# Patient Record
Sex: Female | Born: 1945 | Race: White | Hispanic: No | Marital: Single | Smoking: Never smoker
Health system: Southern US, Community
[De-identification: ages and names within clinical notes are randomized; demographics above are authoritative.]

## PROBLEM LIST (undated history)

## (undated) DIAGNOSIS — E0590 Thyrotoxicosis, unspecified without thyrotoxic crisis or storm: Secondary | ICD-10-CM

## (undated) DIAGNOSIS — R143 Flatulence: Secondary | ICD-10-CM

## (undated) DIAGNOSIS — N289 Disorder of kidney and ureter, unspecified: Secondary | ICD-10-CM

## (undated) DIAGNOSIS — I2782 Chronic pulmonary embolism: Secondary | ICD-10-CM

## (undated) DIAGNOSIS — I248 Other forms of acute ischemic heart disease: Secondary | ICD-10-CM

## (undated) DIAGNOSIS — G2581 Restless legs syndrome: Secondary | ICD-10-CM

## (undated) DIAGNOSIS — F419 Anxiety disorder, unspecified: Secondary | ICD-10-CM

## (undated) DIAGNOSIS — E876 Hypokalemia: Secondary | ICD-10-CM

## (undated) DIAGNOSIS — J309 Allergic rhinitis, unspecified: Secondary | ICD-10-CM

## (undated) DIAGNOSIS — Z7901 Long term (current) use of anticoagulants: Secondary | ICD-10-CM

## (undated) DIAGNOSIS — D649 Anemia, unspecified: Secondary | ICD-10-CM

## (undated) DIAGNOSIS — I509 Heart failure, unspecified: Secondary | ICD-10-CM

## (undated) DIAGNOSIS — N3281 Overactive bladder: Secondary | ICD-10-CM

## (undated) DIAGNOSIS — F329 Major depressive disorder, single episode, unspecified: Secondary | ICD-10-CM

## (undated) DIAGNOSIS — K219 Gastro-esophageal reflux disease without esophagitis: Secondary | ICD-10-CM

## (undated) DIAGNOSIS — K529 Noninfective gastroenteritis and colitis, unspecified: Secondary | ICD-10-CM

## (undated) DIAGNOSIS — I10 Essential (primary) hypertension: Secondary | ICD-10-CM

## (undated) DIAGNOSIS — K649 Unspecified hemorrhoids: Secondary | ICD-10-CM

## (undated) DIAGNOSIS — E46 Unspecified protein-calorie malnutrition: Secondary | ICD-10-CM

## (undated) DIAGNOSIS — F411 Generalized anxiety disorder: Secondary | ICD-10-CM

## (undated) DIAGNOSIS — N952 Postmenopausal atrophic vaginitis: Secondary | ICD-10-CM

## (undated) DIAGNOSIS — D691 Qualitative platelet defects: Secondary | ICD-10-CM

## (undated) DIAGNOSIS — D696 Thrombocytopenia, unspecified: Secondary | ICD-10-CM

## (undated) DIAGNOSIS — G5600 Carpal tunnel syndrome, unspecified upper limb: Secondary | ICD-10-CM

## (undated) DIAGNOSIS — T148XXA Other injury of unspecified body region, initial encounter: Secondary | ICD-10-CM

## (undated) DIAGNOSIS — F29 Unspecified psychosis not due to a substance or known physiological condition: Secondary | ICD-10-CM

## (undated) DIAGNOSIS — G9341 Metabolic encephalopathy: Secondary | ICD-10-CM

## (undated) DIAGNOSIS — I2489 Other forms of acute ischemic heart disease: Secondary | ICD-10-CM

## (undated) DIAGNOSIS — M1990 Unspecified osteoarthritis, unspecified site: Secondary | ICD-10-CM

## (undated) DIAGNOSIS — E785 Hyperlipidemia, unspecified: Secondary | ICD-10-CM

## (undated) HISTORY — DX: Major depressive disorder, single episode, unspecified: F32.9

## (undated) HISTORY — DX: Flatulence: R14.3

## (undated) HISTORY — DX: Other forms of acute ischemic heart disease: I24.89

## (undated) HISTORY — PX: GASTRIC BYPASS: SHX52

## (undated) HISTORY — DX: Noninfective gastroenteritis and colitis, unspecified: K52.9

## (undated) HISTORY — PX: TONSILLECTOMY: SUR1361

## (undated) HISTORY — DX: Carpal tunnel syndrome, unspecified upper limb: G56.00

## (undated) HISTORY — DX: Qualitative platelet defects: D69.1

## (undated) HISTORY — DX: Thrombocytopenia, unspecified: D69.6

## (undated) HISTORY — PX: ABDOMINAL HYSTERECTOMY: SHX81

## (undated) HISTORY — DX: Metabolic encephalopathy: G93.41

## (undated) HISTORY — PX: BACK SURGERY: SHX140

## (undated) HISTORY — DX: Hyperlipidemia, unspecified: E78.5

## (undated) HISTORY — DX: Anemia, unspecified: D64.9

## (undated) HISTORY — DX: Postmenopausal atrophic vaginitis: N95.2

## (undated) HISTORY — PX: APPENDECTOMY: SHX54

## (undated) HISTORY — PX: SPINE SURGERY: SHX786

## (undated) HISTORY — DX: Other forms of acute ischemic heart disease: I24.8

## (undated) HISTORY — DX: Anxiety disorder, unspecified: F41.9

## (undated) HISTORY — DX: Chronic pulmonary embolism: I27.82

## (undated) HISTORY — DX: Unspecified psychosis not due to a substance or known physiological condition: F29

## (undated) HISTORY — DX: Generalized anxiety disorder: F41.1

## (undated) HISTORY — DX: Hypokalemia: E87.6

## (undated) HISTORY — DX: Other injury of unspecified body region, initial encounter: T14.8XXA

## (undated) HISTORY — DX: Unspecified osteoarthritis, unspecified site: M19.90

## (undated) HISTORY — DX: Thyrotoxicosis, unspecified without thyrotoxic crisis or storm: E05.90

## (undated) HISTORY — PX: SHOULDER SURGERY: SHX246

## (undated) HISTORY — DX: Allergic rhinitis, unspecified: J30.9

## (undated) HISTORY — DX: Restless legs syndrome: G25.81

## (undated) HISTORY — DX: Unspecified protein-calorie malnutrition: E46

## (undated) HISTORY — DX: Essential (primary) hypertension: I10

## (undated) HISTORY — DX: Gastro-esophageal reflux disease without esophagitis: K21.9

## (undated) HISTORY — DX: Unspecified hemorrhoids: K64.9

## (undated) HISTORY — DX: Overactive bladder: N32.81

## (undated) HISTORY — DX: Long term (current) use of anticoagulants: Z79.01

---

## 2013-11-02 DIAGNOSIS — I10 Essential (primary) hypertension: Secondary | ICD-10-CM | POA: Insufficient documentation

## 2014-04-19 ENCOUNTER — Emergency Department (HOSPITAL_COMMUNITY)
Admission: EM | Admit: 2014-04-19 | Discharge: 2014-04-20 | Disposition: A | Discharge status: Home / Self Care | Payer: Medicare HMO | Attending: Emergency Medicine | Admitting: Emergency Medicine

## 2014-04-19 ENCOUNTER — Encounter (HOSPITAL_COMMUNITY): Payer: Self-pay | Admitting: Emergency Medicine

## 2014-04-19 DIAGNOSIS — G8929 Other chronic pain: Secondary | ICD-10-CM | POA: Insufficient documentation

## 2014-04-19 DIAGNOSIS — M549 Dorsalgia, unspecified: Secondary | ICD-10-CM | POA: Insufficient documentation

## 2014-04-19 DIAGNOSIS — M129 Arthropathy, unspecified: Secondary | ICD-10-CM | POA: Insufficient documentation

## 2014-04-19 DIAGNOSIS — Z79899 Other long term (current) drug therapy: Secondary | ICD-10-CM | POA: Insufficient documentation

## 2014-04-19 DIAGNOSIS — M1990 Unspecified osteoarthritis, unspecified site: Secondary | ICD-10-CM

## 2014-04-19 DIAGNOSIS — Z791 Long term (current) use of non-steroidal anti-inflammatories (NSAID): Secondary | ICD-10-CM | POA: Insufficient documentation

## 2014-04-19 HISTORY — DX: Disorder of kidney and ureter, unspecified: N28.9

## 2014-04-19 LAB — CBC WITH DIFFERENTIAL/PLATELET
Basophils Absolute: 0 10*3/uL (ref 0.0–0.1)
Basophils Relative: 0 % (ref 0–1)
EOS ABS: 0.2 10*3/uL (ref 0.0–0.7)
EOS PCT: 2 % (ref 0–5)
HCT: 34.9 % — ABNORMAL LOW (ref 36.0–46.0)
HEMOGLOBIN: 11.3 g/dL — AB (ref 12.0–15.0)
Lymphocytes Relative: 28 % (ref 12–46)
Lymphs Abs: 2 10*3/uL (ref 0.7–4.0)
MCH: 28.8 pg (ref 26.0–34.0)
MCHC: 32.4 g/dL (ref 30.0–36.0)
MCV: 88.8 fL (ref 78.0–100.0)
MONOS PCT: 14 % — AB (ref 3–12)
Monocytes Absolute: 1 10*3/uL (ref 0.1–1.0)
Neutro Abs: 4 10*3/uL (ref 1.7–7.7)
Neutrophils Relative %: 56 % (ref 43–77)
Platelets: 136 10*3/uL — ABNORMAL LOW (ref 150–400)
RBC: 3.93 MIL/uL (ref 3.87–5.11)
RDW: 13.1 % (ref 11.5–15.5)
WBC: 7.1 10*3/uL (ref 4.0–10.5)

## 2014-04-19 LAB — URINALYSIS, ROUTINE W REFLEX MICROSCOPIC
BILIRUBIN URINE: NEGATIVE
Glucose, UA: NEGATIVE mg/dL
HGB URINE DIPSTICK: NEGATIVE
Ketones, ur: NEGATIVE mg/dL
Nitrite: NEGATIVE
PROTEIN: NEGATIVE mg/dL
Specific Gravity, Urine: 1.027 (ref 1.005–1.030)
Urobilinogen, UA: 1 mg/dL (ref 0.0–1.0)
pH: 6 (ref 5.0–8.0)

## 2014-04-19 LAB — I-STAT CHEM 8, ED
BUN: 26 mg/dL — ABNORMAL HIGH (ref 6–23)
BUN: 26 mg/dL — ABNORMAL HIGH (ref 6–23)
CALCIUM ION: 1.44 mmol/L — AB (ref 1.13–1.30)
CREATININE: 1.4 mg/dL — AB (ref 0.50–1.10)
Calcium, Ion: 1.41 mmol/L — ABNORMAL HIGH (ref 1.13–1.30)
Chloride: 103 mEq/L (ref 96–112)
Chloride: 104 mEq/L (ref 96–112)
Creatinine, Ser: 1.3 mg/dL — ABNORMAL HIGH (ref 0.50–1.10)
GLUCOSE: 99 mg/dL (ref 70–99)
GLUCOSE: 99 mg/dL (ref 70–99)
HCT: 35 % — ABNORMAL LOW (ref 36.0–46.0)
HCT: 36 % (ref 36.0–46.0)
HEMOGLOBIN: 11.9 g/dL — AB (ref 12.0–15.0)
HEMOGLOBIN: 12.2 g/dL (ref 12.0–15.0)
POTASSIUM: 4 meq/L (ref 3.7–5.3)
Potassium: 4 mEq/L (ref 3.7–5.3)
Sodium: 141 mEq/L (ref 137–147)
Sodium: 141 mEq/L (ref 137–147)
TCO2: 23 mmol/L (ref 0–100)
TCO2: 23 mmol/L (ref 0–100)

## 2014-04-19 LAB — URINE MICROSCOPIC-ADD ON

## 2014-04-19 MED ORDER — HYDROMORPHONE HCL PF 1 MG/ML IJ SOLN
1.0000 mg | Freq: Once | INTRAMUSCULAR | Status: AC
  Administered 2014-04-19: 1 mg via INTRAMUSCULAR

## 2014-04-19 MED ORDER — DIAZEPAM 5 MG PO TABS
5.0000 mg | ORAL_TABLET | Freq: Once | ORAL | Status: AC
  Administered 2014-04-19: 5 mg via ORAL
  Filled 2014-04-19: qty 1

## 2014-04-19 MED ORDER — HYDROCODONE-ACETAMINOPHEN 10-325 MG PO TABS: 1.0000 | ORAL_TABLET | Freq: Four times a day (QID) | ORAL | Status: DC | PRN

## 2014-04-19 MED ORDER — DIAZEPAM 5 MG/ML IJ SOLN
5.0000 mg | Freq: Once | INTRAMUSCULAR | Status: AC
  Administered 2014-04-19: 5 mg via INTRAMUSCULAR
  Filled 2014-04-19: qty 2

## 2014-04-19 MED ORDER — HYDROMORPHONE HCL PF 1 MG/ML IJ SOLN
1.0000 mg | Freq: Once | INTRAMUSCULAR | Status: DC
  Filled 2014-04-19: qty 1

## 2014-04-19 MED ORDER — DIAZEPAM 5 MG PO TABS: 5.0000 mg | ORAL_TABLET | Freq: Four times a day (QID) | ORAL | Status: DC | PRN

## 2014-04-19 MED ORDER — HYDROMORPHONE HCL PF 1 MG/ML IJ SOLN
1.0000 mg | Freq: Once | INTRAMUSCULAR | Status: AC
  Administered 2014-04-19: 1 mg via INTRAMUSCULAR
  Filled 2014-04-19: qty 1

## 2014-04-19 NOTE — ED Provider Notes (Addendum)
CSN: [id number]     Arrival date & time 04/19/14  1730 History   First MD Initiated Contact with Patient 04/19/14 1800     Chief Complaint  Patient presents with  . Back Pain     (Consider location/radiation/quality/duration/timing/severity/associated sxs/prior Treatment) HPI Comments: Katie Evans is a 68 year old, morbidly, obese, female, with a history of chronic back pain, and arthritis in both knees, and shoulder, all requiring replacement surgery, but to to her obesity, she cannot receive the services.  She is due to see her pain specialist, tomorrow for an epidural injection for her chronic back pain.  She had an MRI, 3, weeks, ago, report unavailable to [place] as it was obtained, and a different county, but per patient report, she has spinal stenosis, and some tender, degenerative disc disease.  He is in the emergency room tonight because of pain.  She states she cannot lean forward enough to get herself out of the chair, due the onset of severe.  Muscle spasms with movement.  She does not have strength in her legs to boost her from the, chair.  She's been taking more than her prescribed.  Narcotic medication, and is currently out of her Vicodin, and she states the tramadol was not enough to keep her comfortable.  She recently was diagnosed with a urinary tract, and is taking antibiotics at this time.  She denies any nausea, vomiting, or diarrhea.  She also states, that she has not taken her Lasix in 2-3, weeks.  Because of her inability to get to the bathroom quickly enough.  Patient is a 68 y.o. female presenting with back pain. The history is provided by the patient.  Back Pain Location:  Lumbar spine Quality:  Aching Radiates to:  Does not radiate Pain is:  Same all the time Onset quality:  Gradual Timing:  Constant Progression:  Unchanged Chronicity:  Chronic Context: recent illness   Relieved by:  Narcotics and bed rest Worsened by:  Movement Ineffective treatments:   Narcotics Associated symptoms: no dysuria, no perianal numbness and no tingling   Risk factors: obesity     Past Medical History  Diagnosis Date  . Renal disorder    Past Surgical History  Procedure Laterality Date  . Tonsillectomy    . Appendectomy    . Back surgery    . Gastric bypass    . Shoulder surgery    . Abdominal hysterectomy     No family history on file. History  Substance Use Topics  . Smoking status: Never Smoker   . Smokeless tobacco: Not on file  . Alcohol Use: No   OB History   Grav Para Term Preterm Abortions TAB SAB Ect Mult Living                 Review of Systems  Genitourinary: Negative for dysuria.  Musculoskeletal: Positive for back pain.  Neurological: Negative for tingling.      Allergies  Review of patient's allergies indicates no known allergies.  Home Medications   Prior to Admission medications   Medication Sig Start Date End Date Taking? Authorizing Provider  Cholecalciferol (VITAMIN D-3 PO) Take 1 tablet by mouth daily.   Yes Historical Provider, MD  clonazePAM (KLONOPIN) 1 MG tablet Take 1-2 mg by mouth at bedtime as needed (sleep).   Yes Historical Provider, MD  ferrous sulfate 325 (65 FE) MG tablet Take 1,300 mg by mouth daily.   Yes Historical Provider, MD  FIBER PO Take 5 tablets by mouth  daily.   Yes Historical Provider, MD  FUROSEMIDE PO Take 1 tablet by mouth daily as needed (excessive swelling).   Yes Historical Provider, MD  HYDROcodone-acetaminophen (NORCO) 10-325 MG per tablet Take 1 tablet by mouth 4 (four) times daily. scheduled   Yes Historical Provider, MD  loperamide (IMODIUM A-D) 2 MG tablet Take 6 mg by mouth 2 (two) times daily.   Yes Historical Provider, MD  loratadine (CLARITIN) 10 MG tablet Take 10 mg by mouth daily.   Yes Historical Provider, MD  MELOXICAM PO Take 1 tablet by mouth daily.   Yes Historical Provider, MD  nadolol (CORGARD) 40 MG tablet Take 40 mg by mouth 2 (two) times daily.   Yes Historical  Provider, MD  naproxen sodium (ALEVE) 220 MG tablet Take 220 mg by mouth daily as needed (pain).   Yes Historical Provider, MD  pramipexole (MIRAPEX) 1 MG tablet Take 1 mg by mouth daily.   Yes Historical Provider, MD  PRESCRIPTION MEDICATION Take 1 tablet by mouth 2 (two) times daily. Antibiotic started 5/18 or 5/19 -   Yes Historical Provider, MD  traMADol (ULTRAM) 50 MG tablet Take 50 mg by mouth 3 (three) times daily as needed (pain).    Yes Historical Provider, MD   BP 115/95  Pulse 85  Temp(Src) 97.3 F (36.3 C) (Oral)  Resp 14  Ht 5' 2.5" (1.588 m)  Wt 226 lb (102.513 kg)  BMI 40.65 kg/m2  SpO2 100% Physical Exam  Nursing note and vitals reviewed. Constitutional: She appears well-developed and well-nourished.  Morbidly obese  Eyes: Pupils are equal, round, and reactive to light.  Neck: Normal range of motion.  Cardiovascular: Normal rate and regular rhythm.   Pulmonary/Chest: Effort normal.  Musculoskeletal:  Exam limited by body habitus  Neurological: She is alert.  Skin: Skin is warm.    ED Course  Procedures (including critical care time) Labs Review Labs Reviewed  CBC WITH DIFFERENTIAL - Abnormal; Notable for the following:    Hemoglobin 11.3 (*)    HCT 34.9 (*)    Platelets 136 (*)    Monocytes Relative 14 (*)    All other components within normal limits  URINALYSIS, ROUTINE W REFLEX MICROSCOPIC - Abnormal; Notable for the following:    Color, Urine AMBER (*)    Leukocytes, UA MODERATE (*)    All other components within normal limits  URINE MICROSCOPIC-ADD ON - Abnormal; Notable for the following:    Squamous Epithelial / LPF FEW (*)    All other components within normal limits  I-STAT CHEM 8, ED - Abnormal; Notable for the following:    BUN 26 (*)    Creatinine, Ser 1.40 (*)    Calcium, Ion 1.44 (*)    Hemoglobin 11.9 (*)    HCT 35.0 (*)    All other components within normal limits  I-STAT CHEM 8, ED - Abnormal; Notable for the following:    BUN 26  (*)    Creatinine, Ser 1.30 (*)    Calcium, Ion 1.41 (*)    All other components within normal limits  URINE CULTURE    Imaging Review No results found.   EKG Interpretation None      MDM   Final diagnoses:  Chronic back pain  Arthritis         Arman FilterGail K Anis Degidio, NP 04/19/14 2010  Arman FilterGail K Lennie Dunnigan, NP 05/05/14 2018

## 2014-04-19 NOTE — Discharge Instructions (Signed)
Please followup with your primary care provider in back specialist as planned. Return for any changing or worsening symptoms.    Chronic Back Pain  When back pain lasts longer than 3 months, it is called chronic back pain.People with chronic back pain often go through certain periods that are more intense (flare-ups).  CAUSES Chronic back pain can be caused by wear and tear (degeneration) on different structures in your back. These structures include:  The bones of your spine (vertebrae) and the joints surrounding your spinal cord and nerve roots (facets).  The strong, fibrous tissues that connect your vertebrae (ligaments). Degeneration of these structures may result in pressure on your nerves. This can lead to constant pain. HOME CARE INSTRUCTIONS  Avoid bending, heavy lifting, prolonged sitting, and activities which make the problem worse.  Take brief periods of rest throughout the day to reduce your pain. Lying down or standing usually is better than sitting while you are resting.  Take over-the-counter or prescription medicines only as directed by your caregiver. SEEK IMMEDIATE MEDICAL CARE IF:   You have weakness or numbness in one of your legs or feet.  You have trouble controlling your bladder or bowels.  You have nausea, vomiting, abdominal pain, shortness of breath, or fainting. Document Released: 12/20/2004 Document Revised: 02/04/2012 Document Reviewed: 10/27/2011 [facility] Patient Information 2014 [place], [place].    Arthritis, Nonspecific Arthritis is pain, redness, warmth, or puffiness (inflammation) of a joint. The joint may be stiff or hurt when you move it. One or more joints may be affected. There are many types of arthritis. Your doctor may not know what type you have right away. The most common cause of arthritis is wear and tear on the joint (osteoarthritis). HOME CARE   Only take medicine as told by your doctor.  Rest the joint as much as  possible.  Raise (elevate) your joint if it is puffy.  Use crutches if the painful joint is in your leg.  Drink enough fluids to keep your pee (urine) clear or pale yellow.  Follow your doctor's diet instructions.  Use cold packs for very bad joint pain for 10 to 15 minutes every hour. Ask your doctor if it is okay for you to use hot packs.  Exercise as told by your doctor.  Take a warm shower if you have stiffness in the morning.  Move your sore joints throughout the day. GET HELP RIGHT AWAY IF:   You have a fever.  You have very bad joint pain, puffiness, or redness.  You have many joints that are painful and puffy.  You are not getting better with treatment.  You have very bad back pain or leg weakness.  You cannot control when you poop (bowel movement) or pee (urinate).  You do not feel better in 24 hours or are getting worse.  You are having side effects from your medicine. MAKE SURE YOU:   Understand these instructions.  Will watch your condition.  Will get help right away if you are not doing well or get worse. Document Released: 02/06/2010 Document Revised: 05/13/2012 Document Reviewed: 02/06/2010 [facility] Patient Information 2014 [place], [place].

## 2014-04-19 NOTE — ED Provider Notes (Signed)
Katie Evans 8:00PM patient discussed and signed out. Patient with history of chronic back pains he is followed by chronic pain management as well as significant arthritic pains in the joints presenting with complaints of worsened chronic pains. Patient states that her back pain has been worsened over the past several days with spasming in the low back preventing her from moving and standing up easily. Patient complains of arthritic pains in the knees and bilateral shoulders making it hard for her to stand from sitting. She does have an appointment tomorrow with her chronic pain management specialist with plans for possible back injection. Patient received some Valium and just recently some pain medications IM. Plan to reassess and ambulate.  Patient reports little improvement after initial medications. She was helped out of bed with the nurse able to stand and position to the wheelchair and be moved to the bathroom. This is all she is unable to do. She states she is still too uncomfortable for what she would like. At this time we'll give additional dose of pain medicine.  Patient has received additional pain medications. She does express concern for pains but overall improved. She continued to nursing that she was worried about trying to get up and move or stand but later when staff left the room patient was able to get up on her own. At this time she is stable for discharge home. Doubt any acute or emergent process.  Angus Seller, PA-C 04/20/14 [phone]

## 2014-04-19 NOTE — ED Provider Notes (Signed)
Medical screening examination/treatment/procedure(s) were performed by non-physician practitioner and as supervising physician I was immediately available for consultation/collaboration.   EKG Interpretation None        Layla Maw Ward, DO 04/19/14 2341

## 2014-04-19 NOTE — ED Notes (Signed)
Schulz, NP at bedside.  

## 2014-04-19 NOTE — ED Notes (Signed)
Pt in from home via [facility], per report pt c/o bil lower back pain onset x 3 days, pt reports having a kidney infection & was seen & treated for UTI x 7 days, pt reports not taking Lasix d/t chronic immobility issues, pt has +3 bil lower extremity edema, pt denies CP, SOB, N/V/D, pt A&O x4

## 2014-04-19 NOTE — ED Notes (Signed)
Patient was able to stand and pivot to wheelchair with one asst to be taken to the restroom where she was able to transfer from the wheelchair to the commode without any innocent.

## 2014-04-20 NOTE — ED Provider Notes (Signed)
Medical screening examination/treatment/procedure(s) were performed by non-physician practitioner and as supervising physician I was immediately available for consultation/collaboration.   EKG Interpretation None        Layla Maw Xana Bradt, DO 04/20/14 0030

## 2014-04-20 NOTE — ED Notes (Signed)
Patient was able to move herself from the stretcher to the wheelchair without any assistance. She was then taken to the restroom where she transferred herself to the commode and was able to move herself to the sink and back to the wheelchair without any assistance and without any incident.

## 2014-04-21 LAB — URINE CULTURE: Colony Count: 30000

## 2014-05-06 NOTE — ED Provider Notes (Signed)
Medical screening examination/treatment/procedure(s) were performed by non-physician practitioner and as supervising physician I was immediately available for consultation/collaboration.   EKG Interpretation None        Kristen N Ward, DO 05/06/14 0804 

## 2014-05-25 ENCOUNTER — Encounter (HOSPITAL_COMMUNITY): Payer: Self-pay | Admitting: Emergency Medicine

## 2014-05-25 DIAGNOSIS — Z8742 Personal history of other diseases of the female genital tract: Secondary | ICD-10-CM | POA: Insufficient documentation

## 2014-05-25 DIAGNOSIS — Z79899 Other long term (current) drug therapy: Secondary | ICD-10-CM | POA: Insufficient documentation

## 2014-05-25 DIAGNOSIS — G9332 Myalgic encephalomyelitis/chronic fatigue syndrome: Secondary | ICD-10-CM | POA: Insufficient documentation

## 2014-05-25 DIAGNOSIS — M25569 Pain in unspecified knee: Secondary | ICD-10-CM | POA: Insufficient documentation

## 2014-05-25 DIAGNOSIS — G8929 Other chronic pain: Secondary | ICD-10-CM | POA: Insufficient documentation

## 2014-05-25 DIAGNOSIS — E876 Hypokalemia: Secondary | ICD-10-CM | POA: Insufficient documentation

## 2014-05-25 DIAGNOSIS — I509 Heart failure, unspecified: Secondary | ICD-10-CM | POA: Insufficient documentation

## 2014-05-25 DIAGNOSIS — M549 Dorsalgia, unspecified: Secondary | ICD-10-CM | POA: Insufficient documentation

## 2014-05-25 DIAGNOSIS — R350 Frequency of micturition: Secondary | ICD-10-CM | POA: Insufficient documentation

## 2014-05-25 DIAGNOSIS — R5382 Chronic fatigue, unspecified: Secondary | ICD-10-CM | POA: Insufficient documentation

## 2014-05-25 DIAGNOSIS — R609 Edema, unspecified: Secondary | ICD-10-CM | POA: Insufficient documentation

## 2014-05-25 NOTE — ED Notes (Signed)
Patient here with complaint of bilateral leg swelling and fatigue. States that the last time she felt this way she had to stay in the hospital for a week and required blood transfusions. Also states that she has been experiencing some urinary symptoms as well including urgency and dysuria. Hx of CHF. Additionally complains of hoarse voice and mild cough. Currently has gross pitting edema in both legs extending above knees. States that she cannot exert herself at all without getting extremely tired.

## 2014-05-26 ENCOUNTER — Emergency Department (HOSPITAL_COMMUNITY): Payer: Medicare HMO

## 2014-05-26 ENCOUNTER — Emergency Department (HOSPITAL_COMMUNITY)
Admission: EM | Admit: 2014-05-26 | Discharge: 2014-05-26 | Disposition: A | Discharge status: Home / Self Care | Payer: Medicare HMO | Attending: Emergency Medicine | Admitting: Emergency Medicine

## 2014-05-26 DIAGNOSIS — R350 Frequency of micturition: Secondary | ICD-10-CM

## 2014-05-26 DIAGNOSIS — E876 Hypokalemia: Secondary | ICD-10-CM

## 2014-05-26 DIAGNOSIS — M549 Dorsalgia, unspecified: Secondary | ICD-10-CM

## 2014-05-26 DIAGNOSIS — R531 Weakness: Secondary | ICD-10-CM

## 2014-05-26 DIAGNOSIS — R5382 Chronic fatigue, unspecified: Secondary | ICD-10-CM

## 2014-05-26 DIAGNOSIS — M25561 Pain in right knee: Secondary | ICD-10-CM

## 2014-05-26 DIAGNOSIS — G8929 Other chronic pain: Secondary | ICD-10-CM

## 2014-05-26 DIAGNOSIS — R609 Edema, unspecified: Secondary | ICD-10-CM

## 2014-05-26 DIAGNOSIS — M25562 Pain in left knee: Secondary | ICD-10-CM

## 2014-05-26 HISTORY — DX: Heart failure, unspecified: I50.9

## 2014-05-26 LAB — CBC
HEMATOCRIT: 38 % (ref 36.0–46.0)
HEMOGLOBIN: 12 g/dL (ref 12.0–15.0)
MCH: 27.6 pg (ref 26.0–34.0)
MCHC: 31.6 g/dL (ref 30.0–36.0)
MCV: 87.4 fL (ref 78.0–100.0)
Platelets: 173 10*3/uL (ref 150–400)
RBC: 4.35 MIL/uL (ref 3.87–5.11)
RDW: 13.1 % (ref 11.5–15.5)
WBC: 6.5 10*3/uL (ref 4.0–10.5)

## 2014-05-26 LAB — BASIC METABOLIC PANEL
BUN: 26 mg/dL — AB (ref 6–23)
CALCIUM: 10.1 mg/dL (ref 8.4–10.5)
CO2: 24 mEq/L (ref 19–32)
Chloride: 98 mEq/L (ref 96–112)
Creatinine, Ser: 1.39 mg/dL — ABNORMAL HIGH (ref 0.50–1.10)
GFR calc Af Amer: 45 mL/min — ABNORMAL LOW (ref >90)
GFR calc non Af Amer: 39 mL/min — ABNORMAL LOW (ref >90)
GLUCOSE: 115 mg/dL — AB (ref 70–99)
Potassium: 3.6 mEq/L — ABNORMAL LOW (ref 3.7–5.3)
SODIUM: 137 meq/L (ref 137–147)

## 2014-05-26 LAB — URINALYSIS, ROUTINE W REFLEX MICROSCOPIC
Bilirubin Urine: NEGATIVE
GLUCOSE, UA: NEGATIVE mg/dL
HGB URINE DIPSTICK: NEGATIVE
KETONES UR: NEGATIVE mg/dL
Leukocytes, UA: NEGATIVE
Nitrite: NEGATIVE
PROTEIN: NEGATIVE mg/dL
Specific Gravity, Urine: 1.015 (ref 1.005–1.030)
Urobilinogen, UA: 0.2 mg/dL (ref 0.0–1.0)
pH: 5 (ref 5.0–8.0)

## 2014-05-26 LAB — PRO B NATRIURETIC PEPTIDE: Pro B Natriuretic peptide (BNP): 1593 pg/mL — ABNORMAL HIGH (ref 0–125)

## 2014-05-26 MED ORDER — POTASSIUM CHLORIDE CRYS ER 20 MEQ PO TBCR
40.0000 meq | EXTENDED_RELEASE_TABLET | Freq: Once | ORAL | Status: AC
  Administered 2014-05-26: 40 meq via ORAL
  Filled 2014-05-26: qty 2

## 2014-05-26 NOTE — ED Notes (Signed)
Patient states she does not hurt except when she moves or attempts to walk.

## 2014-05-26 NOTE — Discharge Instructions (Signed)
Fatigue Fatigue is a feeling of tiredness, lack of energy, lack of motivation, or feeling tired all the time. Having enough rest, good nutrition, and reducing stress will normally reduce fatigue. Consult your caregiver if it persists. The nature of your fatigue will help your caregiver to find out its cause. The treatment is based on the cause.  CAUSES  There are many causes for fatigue. Most of the time, fatigue can be traced to one or more of your habits or routines. Most causes fit into one or more of three general areas. They are: Lifestyle problems  Sleep disturbances.  Overwork.  Physical exertion.  Unhealthy habits.  Poor eating habits or eating disorders.  Alcohol and/or drug use .  Lack of proper nutrition (malnutrition). Psychological problems  Stress and/or anxiety problems.  Depression.  Grief.  Boredom. Medical Problems or Conditions  Anemia.  Pregnancy.  Thyroid gland problems.  Recovery from major surgery.  Continuous pain.  Emphysema or asthma that is not well controlled  Allergic conditions.  Diabetes.  Infections (such as mononucleosis).  Obesity.  Sleep disorders, such as sleep apnea.  Heart failure or other heart-related problems.  Cancer.  Kidney disease.  Liver disease.  Effects of certain medicines such as antihistamines, cough and cold remedies, prescription pain medicines, heart and blood pressure medicines, drugs used for treatment of cancer, and some antidepressants. SYMPTOMS  The symptoms of fatigue include:   Lack of energy.  Lack of drive (motivation).  Drowsiness.  Feeling of indifference to the surroundings. DIAGNOSIS  The details of how you feel help guide your caregiver in finding out what is causing the fatigue. You will be asked about your present and past health condition. It is important to review all medicines that you take, including prescription and non-prescription items. A thorough exam will be done.  You will be questioned about your feelings, habits, and normal lifestyle. Your caregiver may suggest blood tests, urine tests, or other tests to look for common medical causes of fatigue.  TREATMENT  Fatigue is treated by correcting the underlying cause. For example, if you have continuous pain or depression, treating these causes will improve how you feel. Similarly, adjusting the dose of certain medicines will help in reducing fatigue.  HOME CARE INSTRUCTIONS   Try to get the required amount of good sleep every night.  Eat a healthy and nutritious diet, and drink enough water throughout the day.  Practice ways of relaxing (including yoga or meditation).  Exercise regularly.  Make plans to change situations that cause stress. Act on those plans so that stresses decrease over time. Keep your work and personal routine reasonable.  Avoid street drugs and minimize use of alcohol.  Start taking a daily multivitamin after consulting your caregiver. SEEK MEDICAL CARE IF:   You have persistent tiredness, which cannot be accounted for.  You have fever.  You have unintentional weight loss.  You have headaches.  You have disturbed sleep throughout the night.  You are feeling sad.  You have constipation.  You have dry skin.  You have gained weight.  You are taking any new or different medicines that you suspect are causing fatigue.  You are unable to sleep at night.  You develop any unusual swelling of your legs or other parts of your body. SEEK IMMEDIATE MEDICAL CARE IF:   You are feeling confused.  Your vision is blurred.  You feel faint or pass out.  You develop severe headache.  You develop severe abdominal, pelvic, or  back pain.  You develop chest pain, shortness of breath, or an irregular or fast heartbeat.  You are unable to pass a normal amount of urine.  You develop abnormal bleeding such as bleeding from the rectum or you vomit blood.  You have thoughts  about harming yourself or committing suicide.  You are worried that you might harm someone else. MAKE SURE YOU:   Understand these instructions.  Will watch your condition.  Will get help right away if you are not doing well or get worse. Document Released: 09/09/2007 Document Revised: 02/04/2012 Document Reviewed: 09/09/2007 [facility] Patient Information 2015 [place], [place]. This information is not intended to replace advice given to you by your health care provider. Make sure you discuss any questions you have with your health care provider.  Hypokalemia Hypokalemia means that the amount of potassium in the blood is lower than normal.Potassium is a chemical, called an electrolyte, that helps regulate the amount of fluid in the body. It also stimulates muscle contraction and helps nerves function properly.Most of the body's potassium is inside of cells, and only a very small amount is in the blood. Because the amount in the blood is so small, minor changes can be life-threatening. CAUSES  Antibiotics.  Diarrhea or vomiting.  Using laxatives too much, which can cause diarrhea.  Chronic kidney disease.  Water pills (diuretics).  Eating disorders (bulimia).  Low magnesium level.  Sweating a lot. SIGNS AND SYMPTOMS  Weakness.  Constipation.  Fatigue.  Muscle cramps.  Mental confusion.  Skipped heartbeats or irregular heartbeat (palpitations).  Tingling or numbness. DIAGNOSIS  Your health care provider can diagnose hypokalemia with blood tests. In addition to checking your potassium level, your health care provider may also check other lab tests. TREATMENT Hypokalemia can be treated with potassium supplements taken by mouth or adjustments in your current medicines. If your potassium level is very low, you may need to get potassium through a vein (IV) and be monitored in the hospital. A diet high in potassium is also helpful. Foods high in potassium are:  Nuts, such  as peanuts and pistachios.  Seeds, such as sunflower seeds and pumpkin seeds.  Peas, lentils, and lima beans.  Whole grain and bran cereals and breads.  Fresh fruit and vegetables, such as apricots, avocado, bananas, cantaloupe, kiwi, oranges, tomatoes, asparagus, and potatoes.  Orange and tomato juices.  Red meats.  Fruit yogurt. HOME CARE INSTRUCTIONS  Take all medicines as prescribed by your health care provider.  Maintain a healthy diet by including nutritious food, such as fruits, vegetables, nuts, whole grains, and lean meats.  If you are taking a laxative, be sure to follow the directions on the label. SEEK MEDICAL CARE IF:  Your weakness gets worse.  You feel your heart pounding or racing.  You are vomiting or having diarrhea.  You are diabetic and having trouble keeping your blood glucose in the normal range. SEEK IMMEDIATE MEDICAL CARE IF:  You have chest pain, shortness of breath, or dizziness.  You are vomiting or having diarrhea for more than 2 days.  You faint. MAKE SURE YOU:   Understand these instructions.  Will watch your condition.  Will get help right away if you are not doing well or get worse. Document Released: 11/12/2005 Document Revised: 09/02/2013 Document Reviewed: 05/15/2013 [facility] Patient Information 2015 [place], [place]. This information is not intended to replace advice given to you by your health care provider. Make sure you discuss any questions you have with your health  care provider.  Potassium Content of Foods Potassium is a mineral found in many foods and drinks. It helps keep fluids and minerals balanced in your body and affects how steadily your heart beats. Potassium also helps control your blood pressure and keep your muscles and nervous system healthy. Certain health conditions and medicines may change the balance of potassium in your body. When this happens, you can help balance your level of potassium through the foods  that you do or do not eat. Your health care provider or dietitian may recommend an amount of potassium that you should have each day. The following lists of foods provide the amount of potassium (in parentheses) per serving in each item. HIGH IN POTASSIUM  The following foods and beverages have 200 mg or more of potassium per serving:  Apricots, 2 raw or 5 dry (200 mg).  Artichoke, 1 medium (345 mg).  Avocado, raw,  each (245 mg).  Banana, 1 medium (425 mg).  Beans, lima, or baked beans, canned,  cup (280 mg).  Beans, white, canned,  cup (595 mg).  Beef roast, 3 oz (320 mg).  Beef, ground, 3 oz (270 mg).  Beets, raw or cooked,  cup (260 mg).  Bran muffin, 2 oz (300 mg).  Broccoli,  cup (230 mg).  Brussels sprouts,  cup (250 mg).  Cantaloupe,  cup (215 mg).  Cereal, 100% bran,  cup (200-400 mg).  Cheeseburger, single, fast food, 1 each (225-400 mg).  Chicken, 3 oz (220 mg).  Clams, canned, 3 oz (535 mg).  Crab, 3 oz (225 mg).  Dates, 5 each (270 mg).  Dried beans and peas,  cup (300-475 mg).  Figs, dried, 2 each (260 mg).  Fish: halibut, tuna, cod, snapper, 3 oz (480 mg).  Fish: salmon, haddock, swordfish, perch, 3 oz (300 mg).  Fish, tuna, canned 3 oz (200 mg).  [place] fries, fast food, 3 oz (470 mg).  Granola with fruit and nuts,  cup (200 mg).  Grapefruit juice,  cup (200 mg).  Greens, beet,  cup (655 mg).  Honeydew melon,  cup (200 mg).  Kale, raw, 1 cup (300 mg).  Kiwi, 1 medium (240 mg).  Kohlrabi, rutabaga, parsnips,  cup (280 mg).  Lentils,  cup (365 mg).  Mango, 1 each (325 mg).  Milk, chocolate, 1 cup (420 mg).  Milk: nonfat, low-fat, whole, buttermilk, 1 cup (350-380 mg).  Molasses, 1 Tbsp (295 mg).  Mushrooms,  cup (280) mg.  Nectarine, 1 each (275 mg).  Nuts: almonds, peanuts, hazelnuts, [place], cashew, mixed, 1 oz (200 mg).  Nuts, pistachios, 1 oz (295 mg).  Orange, 1 each (240 mg).  Orange juice,   cup (235 mg).  Papaya, medium,  fruit (390 mg).  Peanut butter, chunky, 2 Tbsp (240 mg).  Peanut butter, smooth, 2 Tbsp (210 mg).  Pear, 1 medium (200 mg).  Pomegranate, 1 whole (400 mg).  Pomegranate juice,  cup (215 mg).  Pork, 3 oz (350 mg).  Potato chips, salted, 1 oz (465 mg).  Potato, baked with skin, 1 medium (925 mg).  Potatoes, boiled,  cup (255 mg).  Potatoes, mashed,  cup (330 mg).  Prune juice,  cup (370 mg).  Prunes, 5 each (305 mg).  Pudding, chocolate,  cup (230 mg).  Pumpkin, canned,  cup (250 mg).  Raisins, seedless,  cup (270 mg).  Seeds, sunflower or pumpkin, 1 oz (240 mg).  Soy milk, 1 cup (300 mg).  Spinach,  cup (420 mg).  Spinach, canned,  cup (370 mg).  Sweet potato, baked with skin, 1 medium (450 mg).  Swiss chard,  cup (480 mg).  Tomato or vegetable juice,  cup (275 mg).  Tomato sauce or puree,  cup (400-550 mg).  Tomato, raw, 1 medium (290 mg).  Tomatoes, canned,  cup (200-300 mg).  [place], 3 oz (250 mg).  Wheat germ, 1 oz (250 mg).  Winter squash,  cup (250 mg).  Yogurt, plain or fruited, 6 oz (260-435 mg).  Zucchini,  cup (220 mg). MODERATE IN POTASSIUM The following foods and beverages have 50-200 mg of potassium per serving:  Apple, 1 each (150 mg).  Apple juice,  cup (150 mg).  Applesauce,  cup (90 mg).  Apricot nectar,  cup (140 mg).  Asparagus, small spears,  cup or 6 spears (155 mg).  Bagel, cinnamon raisin, 1 each (130 mg).  Bagel, egg or plain, 4 in., 1 each (70 mg).  Beans, green,  cup (90 mg).  Beans, yellow,  cup (190 mg).  Beer, regular, 12 oz (100 mg).  Beets, canned,  cup (125 mg).  Blackberries,  cup (115 mg).  Blueberries,  cup (60 mg).  Bread, whole wheat, 1 slice (70 mg).  Broccoli, raw,  cup (145 mg).  Cabbage,  cup (150 mg).  Carrots, cooked or raw,  cup (180 mg).  Cauliflower, raw,  cup (150 mg).  Celery, raw,  cup (155 mg).  Cereal,  bran flakes, cup (120-150 mg).  Cheese, cottage,  cup (110 mg).  Cherries, 10 each (150 mg).  Chocolate, 1 oz bar (165 mg).  Coffee, brewed 6 oz (90 mg).  Corn,  cup or 1 ear (195 mg).  Cucumbers,  cup (80 mg).  Egg, large, 1 each (60 mg).  Eggplant,  cup (60 mg).  Endive, raw, cup (80 mg).  English muffin, 1 each (65 mg).  Fish, orange roughy, 3 oz (150 mg).  Frankfurter, beef or pork, 1 each (75 mg).  Fruit cocktail,  cup (115 mg).  Grape juice,  cup (170 mg).  Grapefruit,  fruit (175 mg).  Grapes,  cup (155 mg).  Greens: kale, turnip, collard,  cup (110-150 mg).  Ice cream or frozen yogurt, chocolate,  cup (175 mg).  Ice cream or frozen yogurt, vanilla,  cup (120-150 mg).  Lemons, limes, 1 each (80 mg).  Lettuce, all types, 1 cup (100 mg).  Mixed vegetables,  cup (150 mg).  Mushrooms, raw,  cup (110 mg).  Nuts: walnuts, pecans, or macadamia, 1 oz (125 mg).  Oatmeal,  cup (80 mg).  Okra,  cup (110 mg).  Onions, raw,  cup (120 mg).  Peach, 1 each (185 mg).  Peaches, canned,  cup (120 mg).  Pears, canned,  cup (120 mg).  Peas, green, frozen,  cup (90 mg).  Peppers, green,  cup (130 mg).  Peppers, red,  cup (160 mg).  Pineapple juice,  cup (165 mg).  Pineapple, fresh or canned,  cup (100 mg).  Plums, 1 each (105 mg).  Pudding, vanilla,  cup (150 mg).  Raspberries,  cup (90 mg).  Rhubarb,  cup (115 mg).  Rice, wild,  cup (80 mg).  Shrimp, 3 oz (155 mg).  Spinach, raw, 1 cup (170 mg).  Strawberries,  cup (125 mg).  Summer squash  cup (175-200 mg).  Swiss chard, raw, 1 cup (135 mg).  Tangerines, 1 each (140 mg).  Tea, brewed, 6 oz (65 mg).  Turnips,  cup (140 mg).  Watermelon,  cup (85 mg).  Wine, red, table, 5 oz (180 mg).  Wine, white, table, 5 oz (100 mg). LOW IN POTASSIUM The following foods and beverages have less than 50 mg of potassium per serving.  Bread, white, 1 slice (30  mg).  Carbonated beverages, 12 oz (less than 5 mg).  Cheese, 1 oz (20-30 mg).  Cranberries,  cup (45 mg).  Cranberry juice cocktail,  cup (20 mg).  Fats and oils, 1 Tbsp (less than 5 mg).  Hummus, 1 Tbsp (32 mg).  Nectar: papaya, mango, or pear,  cup (35 mg).  Rice, white or brown,  cup (50 mg).  Spaghetti or macaroni,  cup cooked (30 mg).  Tortilla, flour or corn, 1 each (50 mg).  Waffle, 4 in., 1 each (50 mg).  Water chestnuts,  cup (40 mg). Document Released: 06/26/2005 Document Revised: 11/17/2013 Document Reviewed: 10/09/2013 [facility] Patient Information 2015 [place], [place]. This information is not intended to replace advice given to you by your health care provider. Make sure you discuss any questions you have with your health care provider.  Urinary Frequency The number of times a normal person urinates depends upon how much liquid they take in and how much liquid they are losing. If the temperature is hot and there is high humidity then the person will sweat more and usually breathe a little more frequently. These factors decrease the amount of frequency of urination that would be considered normal. The amount you drink is easily determined, but the amount of fluid lost is sometimes more difficult to calculate.  Fluid is lost in two ways:  Sensible fluid loss is usually measured by the amount of urine that you get rid of. Losses of fluid can also occur with diarrhea.  Insensible fluid loss is more difficult to measure. It is caused by evaporation. Insensible loss of fluid occurs through breathing and sweating. It usually ranges from a little less than a quart to a little more than a quart of fluid a day. In normal temperatures and activity levels the average person may urinate 4 to 7 times in a 24-hour period. Needing to urinate more often than that could indicate a problem. If one urinates 4 to 7 times in 24 hours and has large volumes each time, that could  indicate a different problem from one who urinates 4 to 7 times a day and has small volumes. The time of urinating is also an important. Most urinating should be done during the waking hours. Getting up at night to urinate frequently can indicate some problems. CAUSES  The bladder is the organ in your lower abdomen that holds urine. Like a balloon, it swells some as it fills up. Your nerves sense this and tell you it is time to head for the bathroom. There are a number of reasons that you might feel the need to urinate more often than usual. They include:  Urinary tract infection. This is usually associated with other signs such as burning when you urinate.  In men, problems with the prostate (a walnut-size gland that is located near the tube that carries urine out of your body). There are two reasons why the prostate can cause an increased frequency of urination:  An enlarged prostate that does not let the bladder empty well. If the bladder only half empties when you urinate then it only has half the capacity to fill before you have to urinate again.  The nerves in the bladder become more hypersensitive with an increased size of the prostate even  if the bladder empties completely.  Pregnancy.  Obesity. Excess weight is more likely to cause a problem for women more than for men.  Bladder stones or other bladder problems.  Caffeine.  Alcohol.  Medications. For example, drugs that help the body get rid of extra fluid (diuretics) increase urine production. Some other medicines must be taken with lots of fluids.  Muscle or nerve weakness. This might be the result of a spinal cord injury, a stroke, multiple sclerosis or Parkinson's disease.  Long-standing diabetes can decrease the sensation of the bladder. This loss of sensation makes it harder to sense the bladder needs to be emptied. Over a period of years the bladder is stretched out by constant overfilling. This weakens the bladder muscles  so that the bladder does not empty well and has less capacity to fill with new urine.  Interstitial cystitis (also called painful bladder syndrome). This condition develops because the tissues that line the insider of the bladder are inflamed (inflammation is the body's way of reacting to injury or infection). It causes pain and frequent urination. It occurs in women more often than in men. DIAGNOSIS   To decide what might be causing your urinary frequency, your healthcare provider will probably:  Ask about symptoms you have noticed.  Ask about your overall health. This will include questions about any medications you are taking.  Do a physical examination.  Order some tests. These might include:  A blood test to check for diabetes or other health issues that could be contributing to the problem.  Urine testing. This could measure the flow of urine and the pressure on the bladder.  A test of your neurological system (the brain, spinal cord and nerves). This is the system that senses the need to urinate.  A bladder test to check whether it is emptying completely when you urinate.  Cytoscopy. This test uses a thin tube with a tiny camera on it. It offers a look inside your urethra and bladder to see if there are problems.  Imaging tests. You might be given a contrast dye and then asked to urinate. X-rays are taken to see how your bladder is working. TREATMENT  It is important for you to be evaluated to determine if the amount or frequency that you have is unusual or abnormal. If it is found to be abnormal the cause should be determined and this can usually be found out easily. Depending upon the cause treatment could include medication, stimulation of the nerves, or surgery. There are not too many things that you can do as an individual to change your urinary frequency. It is important that you balance the amount of fluid intake needed to compensate for your activity and the temperature.  Medical problems will be diagnosed and taken care of by your physician. There is no particular bladder training such as Kegel's exercises that you can do to help urinary frequency. This is an exercise this is usually done for people who have leaking of urine when they laugh cough or sneeze. HOME CARE INSTRUCTIONS   Take any medications your healthcare provider prescribed or suggested. Follow the directions carefully.  Practice any lifestyle changes that are recommended. These might include:  Drinking less fluid or drinking at different times of the day. If you need to urinate often during the night, for example, you may need to stop drinking fluids early in the evening.  Cutting down on caffeine or alcohol. They both can make you need to urinate more  often than normal. Caffeine is found in coffee, tea and sodas.  Losing weight, if that is recommended.  Keep a journal or a log. You might be asked to record how much you drink and when and when you feel the need to urinate. This will also help evaluate how well the treatment provided by your physician is working. SEEK MEDICAL CARE IF:   Your need to urinate often gets worse.  You feel increased pain or irritation when you urinate.  You notice blood in your urine.  You have questions about any medications that your healthcare provider recommended.  You notice blood, pus or swelling at the site of any test or treatment procedure.  You develop a fever of more than 100.5 F (38.1 C). SEEK IMMEDIATE MEDICAL CARE IF:  You develop a fever of more than 102.0 F (38.9 C). Document Released: 09/08/2009 Document Revised: 02/04/2012 Document Reviewed: 09/08/2009 [facility] Patient Information 2015 [place], [place]. This information is not intended to replace advice given to you by your health care provider. Make sure you discuss any questions you have with your health care provider.  Edema Edema is an abnormal buildup of fluids in your bodytissues.  Edema is somewhatdependent on gravity to pull the fluid to the lowest place in your body. That makes the condition more common in the legs and thighs (lower extremities). Painless swelling of the feet and ankles is common and becomes more likely as you get older. It is also common in looser tissues, like around your eyes.  When the affected area is squeezed, the fluid may move out of that spot and leave a dent for a few moments. This dent is called pitting.  CAUSES  There are many possible causes of edema. Eating too much salt and being on your feet or sitting for a long time can cause edema in your legs and ankles. Hot weather may make edema worse. Common medical causes of edema include:  Heart failure.  Liver disease.  Kidney disease.  Weak blood vessels in your legs.  Cancer.  An injury.  Pregnancy.  Some medications.  Obesity. SYMPTOMS  Edema is usually painless.Your skin may look swollen or shiny.  DIAGNOSIS  Your health care provider may be able to diagnose edema by asking about your medical history and doing a physical exam. You may need to have tests such as X-rays, an electrocardiogram, or blood tests to check for medical conditions that may cause edema.  TREATMENT  Edema treatment depends on the cause. If you have heart, liver, or kidney disease, you need the treatment appropriate for these conditions. General treatment may include:  Elevation of the affected body part above the level of your heart.  Compression of the affected body part. Pressure from elastic bandages or support stockings squeezes the tissues and forces fluid back into the blood vessels. This keeps fluid from entering the tissues.  Restriction of fluid and salt intake.  Use of a water pill (diuretic). These medications are appropriate only for some types of edema. They pull fluid out of your body and make you urinate more often. This gets rid of fluid and reduces swelling, but diuretics can have side  effects. Only use diuretics as directed by your health care provider. HOME CARE INSTRUCTIONS   Keep the affected body part above the level of your heart when you are lying down.   Do not sit still or stand for prolonged periods.   Do not put anything directly under your knees when  lying down.  Do not wear constricting clothing or garters on your upper legs.   Exercise your legs to work the fluid back into your blood vessels. This may help the swelling go down.   Wear elastic bandages or support stockings to reduce ankle swelling as directed by your health care provider.   Eat a low-salt diet to reduce fluid if your health care provider recommends it.   Only take medicines as directed by your health care provider. SEEK MEDICAL CARE IF:   Your edema is not responding to treatment.  You have heart, liver, or kidney disease and notice symptoms of edema.  You have edema in your legs that does not improve after elevating them.   You have sudden and unexplained weight gain. SEEK IMMEDIATE MEDICAL CARE IF:   You develop shortness of breath or chest pain.   You cannot breathe when you lie down.  You develop pain, redness, or warmth in the swollen areas.   You have heart, liver, or kidney disease and suddenly get edema.  You have a fever and your symptoms suddenly get worse. MAKE SURE YOU:   Understand these instructions.  Will watch your condition.  Will get help right away if you are not doing well or get worse. Document Released: 11/12/2005 Document Revised: 11/17/2013 Document Reviewed: 09/04/2013 [facility] Patient Information 2015 [place]. This information is not intended to replace advice given to you by your health care provider. Make sure you discuss any questions you have with your health care provider.

## 2014-05-26 NOTE — ED Provider Notes (Signed)
CSN: [id number]     Arrival date & time 05/25/14  2327 History   First MD Initiated Contact with Patient 05/26/14 0141     Chief Complaint  Patient presents with  . Fatigue  . Cough  . Leg Swelling     (Consider location/radiation/quality/duration/timing/severity/associated sxs/prior Treatment) HPI 68 year old female presents to the emergency department from home with complaint of fatigue.  Patient reports that she has been fatigued for some time, it has been worsening.  Patient reports she had similar fatigue about 2 years ago that required several blood transfusions.  She reports at that time they were unsure what caused her anemia.  Patient also complaining of bilateral lower extremity edema, dry cough, frequent urination.  She reports history of congestive heart failure.  She takes Lasix as needed.  She reports when she takes her Lasix and elevate her feet her swelling improves.  Patient reports history of chronic back pain and knee pain.  She's been advised that she needs bilateral knee replacements.  She has been advised that she has to lose weight prior to being able to have surgery.  Patient reports she has increasing urinary frequency and often incontinence.  She feels that this is worsening and that she might have a urinary tract infection.  Patient is followed by a von towel.  Patient reports she had a stress test done recently in preparation of having the surgery which was negative.  She was told she had congestive heart failure.  She does not know her ejection fraction.  She denies any chest pain.  Patient reports shortness of breath worse with exertion.  No orthopnea.   Past Medical History  Diagnosis Date  . Renal disorder   . CHF (congestive heart failure)    Past Surgical History  Procedure Laterality Date  . Tonsillectomy    . Appendectomy    . Back surgery    . Gastric bypass    . Shoulder surgery    . Abdominal hysterectomy     No family history on file. History   Substance Use Topics  . Smoking status: Never Smoker   . Smokeless tobacco: Not on file  . Alcohol Use: No   OB History   Grav Para Term Preterm Abortions TAB SAB Ect Mult Living                 Review of Systems  See History of Present Illness; otherwise all other systems are reviewed and negative   Allergies  Review of patient's allergies indicates no known allergies.  Home Medications   Prior to Admission medications   Medication Sig Start Date End Date Taking? Authorizing Provider  Cholecalciferol (VITAMIN D-3 PO) Take 1 tablet by mouth daily.   Yes Historical Provider, MD  clonazePAM (KLONOPIN) 1 MG tablet Take 1-2 mg by mouth at bedtime as needed (sleep).   Yes Historical Provider, MD  furosemide (LASIX) 20 MG tablet Take 40 mg by mouth daily.    Yes Historical Provider, MD  HYDROcodone-acetaminophen (NORCO) 10-325 MG per tablet Take 1 tablet by mouth 4 (four) times daily. scheduled   Yes Historical Provider, MD  loperamide (IMODIUM A-D) 2 MG tablet Take 6 mg by mouth 2 (two) times daily.   Yes Historical Provider, MD  loratadine (CLARITIN) 10 MG tablet Take 10 mg by mouth daily.   Yes Historical Provider, MD  nadolol (CORGARD) 40 MG tablet Take 40 mg by mouth 2 (two) times daily.   Yes Historical Provider, MD  pramipexole (  MIRAPEX) 1 MG tablet Take 1 mg by mouth daily.   Yes Historical Provider, MD   BP 112/55  Pulse 87  Temp(Src) 97.5 F (36.4 C) (Oral)  Resp 21  Wt 217 lb (98.431 kg)  SpO2 97% Physical Exam  Nursing note and vitals reviewed. Constitutional: She is oriented to person, place, and time. She appears well-developed and well-nourished.  HENT:  Head: Normocephalic and atraumatic.  Right Ear: External ear normal.  Left Ear: External ear normal.  Nose: Nose normal.  Mouth/Throat: Oropharynx is clear and moist.  Eyes: Conjunctivae and EOM are normal. Pupils are equal, round, and reactive to light.  Neck: Normal range of motion. Neck supple. No JVD  present. No tracheal deviation present. No thyromegaly present.  Cardiovascular: Normal rate, regular rhythm, normal heart sounds and intact distal pulses.  Exam reveals no gallop and no friction rub.   No murmur heard. Pulmonary/Chest: Effort normal and breath sounds normal. No stridor. No respiratory distress. She has no wheezes. She has no rales. She exhibits no tenderness.  Abdominal: Soft. Bowel sounds are normal. She exhibits no distension and no mass. There is no tenderness. There is no rebound and no guarding.  Musculoskeletal: Normal range of motion. She exhibits edema (2+ edema to knees) and tenderness.  Lymphadenopathy:    She has no cervical adenopathy.  Neurological: She is alert and oriented to person, place, and time. She has normal reflexes. No cranial nerve deficit. She exhibits normal muscle tone. Coordination (tenderness diffusely over knees bilaterally) normal.  Skin: Skin is warm and dry. No rash noted. No erythema. No pallor.  Psychiatric: Her behavior is normal. Judgment and thought content normal.  Flat affect, depressed mood    ED Course  Procedures (including critical care time) Labs Review Labs Reviewed  BASIC METABOLIC PANEL - Abnormal; Notable for the following:    Potassium 3.6 (*)    Glucose, Bld 115 (*)    BUN 26 (*)    Creatinine, Ser 1.39 (*)    GFR calc non Af Amer 39 (*)    GFR calc Af Amer 45 (*)    All other components within normal limits  PRO B NATRIURETIC PEPTIDE - Abnormal; Notable for the following:    Pro B Natriuretic peptide (BNP) 1593.0 (*)    All other components within normal limits  CBC  URINALYSIS, ROUTINE W REFLEX MICROSCOPIC    Imaging Review Dg Chest 2 View  05/26/2014   CLINICAL DATA:  Shortness of breath.  History of CHF  EXAM: CHEST  2 VIEW  COMPARISON:  None.  FINDINGS: Normal heart size and mediastinal contours. There is minimal atelectasis or scarring at the peripheral left base. No consolidation or edema. No effusion or  pneumothorax. Left glenohumeral arthroplasty. There is a compression deformity at the thoracolumbar junction, likely of T12, presumably remote given the history. Height loss is mild.  IMPRESSION: No active cardiopulmonary disease.   Electronically Signed   By: Tiburcio Pea M.D.   On: 05/26/2014 02:34     EKG Interpretation None      MDM   Final diagnoses:  Chronic fatigue  Generalized weakness  Knee pain, bilateral  Chronic back pain  Urinary frequency  Hypokalemia  Peripheral edema    68 year old female with fatigue.  Workup here fairly unremarkable.  Outside records reviewed.  No signs of acute CHF exacerbation.  No anemia.  No urinary tract infection.  I feel patient is safe for discharge home and followup with her primary care  Dr. as well as her specialist as she has in place already.    Olivia Mackielga M Kailei Cowens, MD 05/26/14 [phone]

## 2014-05-26 NOTE — ED Notes (Signed)
Pt presents with increased weakness, progressively worse exertional SOB and increased bilat LE pitting edema. Pt denies chest pain or cough at present. Pt resting comfortably on bed in no acute distress.

## 2014-06-25 ENCOUNTER — Emergency Department (HOSPITAL_BASED_OUTPATIENT_CLINIC_OR_DEPARTMENT_OTHER)
Admission: EM | Admit: 2014-06-25 | Discharge: 2014-06-26 | Disposition: A | Discharge status: Home / Self Care | Payer: Medicare HMO | Attending: Emergency Medicine | Admitting: Emergency Medicine

## 2014-06-25 ENCOUNTER — Encounter (HOSPITAL_BASED_OUTPATIENT_CLINIC_OR_DEPARTMENT_OTHER): Payer: Self-pay | Admitting: Emergency Medicine

## 2014-06-25 DIAGNOSIS — R5383 Other fatigue: Secondary | ICD-10-CM | POA: Diagnosis present

## 2014-06-25 DIAGNOSIS — Z87448 Personal history of other diseases of urinary system: Secondary | ICD-10-CM | POA: Diagnosis not present

## 2014-06-25 DIAGNOSIS — Z79899 Other long term (current) drug therapy: Secondary | ICD-10-CM | POA: Diagnosis not present

## 2014-06-25 DIAGNOSIS — R5381 Other malaise: Secondary | ICD-10-CM | POA: Diagnosis not present

## 2014-06-25 DIAGNOSIS — I509 Heart failure, unspecified: Secondary | ICD-10-CM | POA: Diagnosis not present

## 2014-06-25 LAB — COMPREHENSIVE METABOLIC PANEL
ALT: 16 U/L (ref 0–35)
ANION GAP: 17 — AB (ref 5–15)
AST: 25 U/L (ref 0–37)
Albumin: 3.1 g/dL — ABNORMAL LOW (ref 3.5–5.2)
Alkaline Phosphatase: 98 U/L (ref 39–117)
BILIRUBIN TOTAL: 0.4 mg/dL (ref 0.3–1.2)
BUN: 20 mg/dL (ref 6–23)
CHLORIDE: 96 meq/L (ref 96–112)
CO2: 24 meq/L (ref 19–32)
CREATININE: 1 mg/dL (ref 0.50–1.10)
Calcium: 11 mg/dL — ABNORMAL HIGH (ref 8.4–10.5)
GFR calc Af Amer: 67 mL/min — ABNORMAL LOW (ref >90)
GFR calc non Af Amer: 58 mL/min — ABNORMAL LOW (ref >90)
Glucose, Bld: 157 mg/dL — ABNORMAL HIGH (ref 70–99)
Potassium: 3.2 mEq/L — ABNORMAL LOW (ref 3.7–5.3)
Sodium: 137 mEq/L (ref 137–147)
Total Protein: 6.9 g/dL (ref 6.0–8.3)

## 2014-06-25 LAB — URINALYSIS, ROUTINE W REFLEX MICROSCOPIC
Bilirubin Urine: NEGATIVE
GLUCOSE, UA: NEGATIVE mg/dL
Hgb urine dipstick: NEGATIVE
KETONES UR: NEGATIVE mg/dL
NITRITE: NEGATIVE
Protein, ur: NEGATIVE mg/dL
Specific Gravity, Urine: 1.007 (ref 1.005–1.030)
Urobilinogen, UA: 1 mg/dL (ref 0.0–1.0)
pH: 6 (ref 5.0–8.0)

## 2014-06-25 LAB — CBC
HEMATOCRIT: 38 % (ref 36.0–46.0)
Hemoglobin: 12.4 g/dL (ref 12.0–15.0)
MCH: 27.7 pg (ref 26.0–34.0)
MCHC: 32.6 g/dL (ref 30.0–36.0)
MCV: 84.8 fL (ref 78.0–100.0)
Platelets: 180 10*3/uL (ref 150–400)
RBC: 4.48 MIL/uL (ref 3.87–5.11)
RDW: 13.7 % (ref 11.5–15.5)
WBC: 8.8 10*3/uL (ref 4.0–10.5)

## 2014-06-25 LAB — URINE MICROSCOPIC-ADD ON

## 2014-06-25 LAB — TROPONIN I: Troponin I: <0.3 ng/mL (ref <0.30)

## 2014-06-25 NOTE — ED Notes (Signed)
Gradually getting weaker over several weeks

## 2014-06-25 NOTE — ED Notes (Signed)
I met patient in waiting room, took to room by wheelchair. I got ECG and gave copy to Dr. Canary Brim, had problems with ECG transmittal, which delayed vitals.

## 2014-06-25 NOTE — ED Provider Notes (Signed)
CSN: [id number]     Arrival date & time 06/25/14  2112 History  This chart was scribed for No att. providers found by Carl Best, ED Scribe. This patient was seen in room MHOTF/OTF and the patient's care was started at 9:44 PM.     Chief Complaint  Patient presents with  . Fatigue   The history is provided by the patient. No language interpreter was used.   HPI Comments: Katie Evans is a 68 y.o. female with a history of CHF and renal disorder who presents to the Emergency Department complaining of gradually worsening weakness that started three weeks ago.  She has not been eating and drinking normally since her symptoms started.  She believes that she may be dehydrated.  She denies abdominal pain, nausea, vomiting, new diarrhea, fever, and new SOB as associated symptoms.  She has experienced these symptoms in the past and was diagnosed with anemia.  She takes two Lasix daily but only took one dose of Lasix today.  She does not have any home healthcare.    Past Medical History  Diagnosis Date  . Renal disorder   . CHF (congestive heart failure)    Past Surgical History  Procedure Laterality Date  . Tonsillectomy    . Appendectomy    . Back surgery    . Gastric bypass    . Shoulder surgery    . Abdominal hysterectomy     No family history on file. History  Substance Use Topics  . Smoking status: Never Smoker   . Smokeless tobacco: Not on file  . Alcohol Use: No   OB History   Grav Para Term Preterm Abortions TAB SAB Ect Mult Living                 Review of Systems  Constitutional: Positive for activity change. Negative for fever.  Gastrointestinal: Negative for nausea, vomiting, abdominal pain and diarrhea.  Neurological: Positive for weakness.  All other systems reviewed and are negative.     Allergies  Review of patient's allergies indicates no known allergies.  Home Medications   Prior to Admission medications   Medication Sig Start Date End Date Taking?  Authorizing Provider  Cholecalciferol (VITAMIN D-3 PO) Take 1 tablet by mouth daily.    Historical Provider, MD  clonazePAM (KLONOPIN) 1 MG tablet Take 1-2 mg by mouth at bedtime as needed (sleep).    Historical Provider, MD  furosemide (LASIX) 20 MG tablet Take 40 mg by mouth daily.     Historical Provider, MD  HYDROcodone-acetaminophen (NORCO) 10-325 MG per tablet Take 1 tablet by mouth 4 (four) times daily. scheduled    Historical Provider, MD  loperamide (IMODIUM A-D) 2 MG tablet Take 6 mg by mouth 2 (two) times daily.    Historical Provider, MD  loratadine (CLARITIN) 10 MG tablet Take 10 mg by mouth daily.    Historical Provider, MD  nadolol (CORGARD) 40 MG tablet Take 40 mg by mouth 2 (two) times daily.    Historical Provider, MD  pramipexole (MIRAPEX) 1 MG tablet Take 1 mg by mouth daily.    Historical Provider, MD   Triage Vitals: BP 130/64  Pulse 91  Temp(Src) 97.1 F (36.2 C) (Oral)  Resp 24  Ht 5' 2.5" (1.588 m)  Wt 215 lb (97.523 kg)  BMI 38.67 kg/m2  SpO2 98%  Physical Exam  Nursing note and vitals reviewed. Constitutional: She is oriented to person, place, and time. She appears well-developed and well-nourished.  HENT:  Head: Normocephalic and atraumatic.  Mouth/Throat: Mucous membranes are normal.  Eyes: EOM are normal.  Neck: Normal range of motion.  Cardiovascular: Normal rate, regular rhythm and normal heart sounds.  Exam reveals no gallop and no friction rub.   No murmur heard. Pulmonary/Chest: Effort normal and breath sounds normal. No respiratory distress. She has no wheezes. She has no rales.  Abdominal: There is no tenderness.  Musculoskeletal: Normal range of motion. She exhibits edema.  2+ symmetric edema of lower extremities.  Neurological: She is alert and oriented to person, place, and time.  Skin: Skin is warm and dry.  Psychiatric: She has a normal mood and affect. Her behavior is normal.    ED Course  Procedures (including critical care  time)  DIAGNOSTIC STUDIES: Oxygen Saturation is 98% on room air, normal by my interpretation.    COORDINATION OF CARE: 9:46 PM- Discussed checking the patient's lab work.  Advised the patient to follow-up with her PCP if her lab work returns normal.  The patient agreed to the treatment plan.   Labs Review Labs Reviewed  COMPREHENSIVE METABOLIC PANEL - Abnormal; Notable for the following:    Potassium 3.2 (*)    Glucose, Bld 157 (*)    Calcium 11.0 (*)    Albumin 3.1 (*)    GFR calc non Af Amer 58 (*)    GFR calc Af Amer 67 (*)    Anion gap 17 (*)    All other components within normal limits  URINALYSIS, ROUTINE W REFLEX MICROSCOPIC - Abnormal; Notable for the following:    Leukocytes, UA TRACE (*)    All other components within normal limits  CBC  TROPONIN I  URINE MICROSCOPIC-ADD ON    Imaging Review No results found.   EKG Interpretation   Date/Time:  Friday June 25 2014 21:26:15 EDT Ventricular Rate:  96 PR Interval:  206 QRS Duration: 94 QT Interval:  380 QTC Calculation: 480 R Axis:   -36 Text Interpretation:  Normal sinus rhythm Left axis deviation Possible  Anterior infarct , age undetermined Abnormal ECG No old tracing to compare  Confirmed by [facility]  MD, Mohamadou Maciver [phone]([id number]) on 06/25/2014 11:38:28 PM      MDM   Final diagnoses:  Other fatigue    Pt presenting with c/o generalized fatigue.  Workup in the ED is reassuring.  I have made a referral to home health as patient states she is having knee pain that makes it difficult for her to ambulate and is having difficulty getting to the restroom.  No acute emergent finding during ED visit today.  Discharged with strict return precautions.  Pt agreeable with plan.  I personally performed the services described in this documentation, which was scribed in my presence. The recorded information has been reviewed and is accurate.    Ethelda ChickMartha K Linker, MD 06/27/14 [phone]

## 2014-06-25 NOTE — ED Notes (Signed)
Patient wanted weight taken, I used manual scale, got 195.5

## 2014-06-25 NOTE — Discharge Instructions (Signed)
Return to the ED with any concerns including chest pain, difficulty breathing, fainting, vomiting and not able to keep down liquids, decreased level of alertness/lethargy, or any other alarming symptoms °

## 2014-06-25 NOTE — ED Notes (Signed)
Patient asked for weight to be taken, bring bedside scale to room. Patient unable to walk very far as her legs hurt.

## 2014-09-24 DIAGNOSIS — I272 Pulmonary hypertension, unspecified: Secondary | ICD-10-CM | POA: Insufficient documentation

## 2014-10-05 DIAGNOSIS — I5033 Acute on chronic diastolic (congestive) heart failure: Secondary | ICD-10-CM | POA: Insufficient documentation

## 2014-10-07 DIAGNOSIS — D696 Thrombocytopenia, unspecified: Secondary | ICD-10-CM | POA: Insufficient documentation

## 2014-10-11 ENCOUNTER — Non-Acute Institutional Stay (SKILLED_NURSING_FACILITY): Payer: Medicare HMO | Admitting: Adult Health

## 2014-10-11 ENCOUNTER — Encounter: Payer: Self-pay | Admitting: *Deleted

## 2014-10-11 ENCOUNTER — Encounter: Payer: Self-pay | Admitting: Adult Health

## 2014-10-11 DIAGNOSIS — E0590 Thyrotoxicosis, unspecified without thyrotoxic crisis or storm: Secondary | ICD-10-CM

## 2014-10-11 DIAGNOSIS — D696 Thrombocytopenia, unspecified: Secondary | ICD-10-CM

## 2014-10-11 DIAGNOSIS — F32A Depression, unspecified: Secondary | ICD-10-CM

## 2014-10-11 DIAGNOSIS — M1990 Unspecified osteoarthritis, unspecified site: Secondary | ICD-10-CM | POA: Insufficient documentation

## 2014-10-11 DIAGNOSIS — E039 Hypothyroidism, unspecified: Secondary | ICD-10-CM

## 2014-10-11 DIAGNOSIS — I10 Essential (primary) hypertension: Secondary | ICD-10-CM

## 2014-10-11 DIAGNOSIS — G2581 Restless legs syndrome: Secondary | ICD-10-CM | POA: Insufficient documentation

## 2014-10-11 DIAGNOSIS — I5033 Acute on chronic diastolic (congestive) heart failure: Secondary | ICD-10-CM

## 2014-10-11 DIAGNOSIS — I272 Pulmonary hypertension, unspecified: Secondary | ICD-10-CM

## 2014-10-11 DIAGNOSIS — R531 Weakness: Secondary | ICD-10-CM

## 2014-10-11 DIAGNOSIS — F329 Major depressive disorder, single episode, unspecified: Secondary | ICD-10-CM

## 2014-10-11 DIAGNOSIS — D509 Iron deficiency anemia, unspecified: Secondary | ICD-10-CM | POA: Insufficient documentation

## 2014-10-11 NOTE — Progress Notes (Signed)
Patient ID: Katie Roseamela Evans, female   DOB: May 15, 1946, 68 y.o.   MRN: [id number]   10/11/2014  Facility:  Nursing Home Location:  [facility] Room Number: 903-1 LEVEL OF CARE:  SNF (31)   Chief Complaint  Patient presents with  . Hospitalization Follow-up    Chronic diastolic CHF, Anemia, Arthritis, Hypertension, Depression, Hyperthyroidism, Generalized weakness and restless leg syndrome    HISTORY OF PRESENT ILLNESS:   This is a 68 year [facility] Acute on chronic diastoli CHF. She has been admitted for a short-term rehabilitation.  REASSESSMENT OF ONGOING PROBLEMS:  ANEMIA: The anemia has been stable. The patient denies fatigue, melena or hematochezia. No complications from the medications currently being used.  11/15 hgb 10.7  HTN: Pt 's HTN remains stable.  Denies CP, sob, DOE, headaches, dizziness or visual disturbances.  No complications from the medications currently being used.  Last BP : 111/88  DEPRESSION: The depression remains stable. Patient denies ongoing feelings of sadness, insomnia, anedhonia or lack of appetite. No complications reported from the medications currently being used. Staff do not report behavioral problems.  PAST MEDICAL HISTORY:  Past Medical History  Diagnosis Date  . Renal disorder   . CHF (congestive heart failure)   . Arthritis   . Hypertension   . Carpal tunnel syndrome     CURRENT MEDICATIONS: Reviewed per MAR/see medication list  No Known Allergies   REVIEW OF SYSTEMS:  GENERAL: no change in appetite, no fatigue, no weight changes, no fever, chills or weakness RESPIRATORY: no cough, SOB, DOE, wheezing, hemoptysis CARDIAC: no chest pain, or palpitations GI: no abdominal pain, diarrhea, constipation, heart burn, nausea or vomiting  PHYSICAL EXAMINATION  GENERAL: no acute distress EYES: conjunctivae normal, sclerae  normal, normal eye lids NECK: supple, trachea midline, no neck masses, no thyroid tenderness, no thyromegaly LYMPHATICS: no LAN in the neck, no supraclavicular LAN RESPIRATORY: breathing is even & unlabored, BS CTAB CARDIAC: RRR, no murmur,no extra heart sounds, BLE edema trace GI: abdomen soft, normal BS, no masses, no tenderness, no hepatomegaly, no splenomegaly EXTREMITIES: able to move all 4 extremities; uses walker when ambulating PSYCHIATRIC: the patient is alert & oriented to person, affect & behavior appropriate  LABS/RADIOLOGY: 10/08/14  WBC 6.0 hemoglobin 10.7 sodium 142 potassium 4.1 BUN 23 creatinine 0.73 calcium 10.1 magnesium 1.90 10/06/14  CK 2.57 trop 0.0 23 10/05/14  . 3.4 bilirubin total 0.55 96 AST 24 ALT 16 TSH<0.01 hemoglobin A1c 5.9  ASSESSMENT/PLAN:  Acute on chronic diastolic CHF - stable; continue Demadex 20 mg 1 tab by mouth daily Anemia, iron deficiency - stable; hemoglobin 10.7; continue iron 325 mg by mouth daily Arthritis - stable; continue Mobic 7.5 mg by mouth daily, Norco 10/325 mg 1 tab by mouth every 8 hours when necessary and Ultram 50 mg by mouth every 8 hours when necessary Hypertension - well controlled; continue Corgard 40 mg by mouth daily Restless leg syndrome - stable; continue Mirapex 1 mg by mouth daily Depression - stable; continue Zoloft 100 mg by mouth daily Hyperthyroidism - TSH high =<0.01; free T4 low; follow-up with endocrinologist Generalized weakness - for rehabilitation   Spent 50 minutes in patient care.     [facility] Sherman BlvdMEDINA-VARGAS,MONINA, NP [facility] [phone]

## 2014-10-14 ENCOUNTER — Non-Acute Institutional Stay (SKILLED_NURSING_FACILITY): Payer: Medicare HMO | Admitting: Internal Medicine

## 2014-10-14 ENCOUNTER — Encounter: Payer: Self-pay | Admitting: Internal Medicine

## 2014-10-14 DIAGNOSIS — D509 Iron deficiency anemia, unspecified: Secondary | ICD-10-CM

## 2014-10-14 DIAGNOSIS — F329 Major depressive disorder, single episode, unspecified: Secondary | ICD-10-CM

## 2014-10-14 DIAGNOSIS — R5381 Other malaise: Secondary | ICD-10-CM

## 2014-10-14 DIAGNOSIS — F32A Depression, unspecified: Secondary | ICD-10-CM

## 2014-10-14 DIAGNOSIS — I5033 Acute on chronic diastolic (congestive) heart failure: Secondary | ICD-10-CM

## 2014-10-14 DIAGNOSIS — I10 Essential (primary) hypertension: Secondary | ICD-10-CM

## 2014-10-14 DIAGNOSIS — M1990 Unspecified osteoarthritis, unspecified site: Secondary | ICD-10-CM

## 2014-10-14 NOTE — Progress Notes (Signed)
Patient ID: Katie Evans, female   DOB: [birth year], 68 y.o.   MRN: [id number]     [facility]   PCP: CORRINGTON,KIP A, MD  Code Status: full code  No Known Allergies  Chief Complaint  Patient presents with  . New Admit To SNF     HPI:  68 y/o female pt is here for STR post hospital admission 10/05/14-10/08/14 with acute exacerbation of her chronic diastolic chf. She had been decompensated months prior to admission. She responded well to diuresis. She was noted to have skin sores and wound care team attended to it. She is seen in her room today with therapy team present. She complaints of getting tired easily and not feeling motivated. She complaints of generalized aches on and off. Mentions that her mood is fine and denies depression. Denies dyspnea at rest. Denies chest pain. Mentions she had some chest discomfort this am and was given klonopin and it resolved. She has pmh of htn, chf, vit d def, OA, hyperthyroidism  Review of Systems:  Constitutional: Negative for fever, chills, diaphoresis.  HENT: Negative for congestion Eyes: Negative for eye pain, blurred vision, double vision and discharge.  Respiratory: Negative for cough, sputum production, shortness of breath and wheezing.   Cardiovascular: Negative for chest pain, palpitations, orthopnea and leg swelling.  Gastrointestinal: Negative for heartburn, nausea, vomiting, abdominal pain, melena, rectal bleed. Has IBS and has loose stools (chronic)   Genitourinary: Negative for dysuria, urgency, frequency Musculoskeletal: Negative for back pain, falls Skin: Negative for itching, rash.  Neurological: Negative for weakness,focal weakness and headaches. occassional dizziness Psychiatric/Behavioral: Negative for depression, insomnia and memory loss. Has anxiety  Past Medical History  Diagnosis Date  . Renal disorder   . CHF (congestive heart failure)   . Arthritis   . Hypertension   . Carpal  tunnel syndrome    Past Surgical History  Procedure Laterality Date  . Tonsillectomy    . Appendectomy    . Back surgery    . Gastric bypass    . Shoulder surgery    . Abdominal hysterectomy    . Spine surgery      spinal fusion   Social History:   reports that she has never smoked. She has never used smokeless tobacco. She reports that she does not drink alcohol or use illicit drugs.  History reviewed. No pertinent family history.  Medications: Patient's Medications  New Prescriptions   No medications on file  Previous Medications   CHOLECALCIFEROL (VITAMIN D-3 PO)    Take 1 tablet by mouth daily.   CLONAZEPAM (KLONOPIN) 1 MG TABLET    Take 1 mg by mouth 2 (two) times daily.    CLOTRIMAZOLE (LOTRIMIN) 1 % CREAM    Apply 1 application topically every 8 (eight) hours.    DICLOFENAC SODIUM (VOLTAREN) 1 % GEL    Apply 4 g topically 4 (four) times daily.   FERROUS SULFATE 325 (65 FE) MG TABLET    Take 325 mg by mouth daily with breakfast.   HYDROCODONE-ACETAMINOPHEN (NORCO) 10-325 MG PER TABLET    Take 1 tablet by mouth every 8 (eight) hours as needed. scheduled   LOPERAMIDE (IMODIUM A-D) 2 MG TABLET    Take 6 mg by mouth 2 (two) times daily.   LORATADINE (CLARITIN) 10 MG TABLET    Take 10 mg by mouth daily.   MELOXICAM (MOBIC) 7.5 MG TABLET    Take 7.5 mg by mouth daily.   NADOLOL (CORGARD) 40  MG TABLET    Take 40 mg by mouth 2 (two) times daily.   NYSTATIN (MYCOSTATIN/NYSTOP) 100000 UNIT/GM POWD    Apply topically 3 (three) times daily as needed.   POLYCARBOPHIL (FIBERCON) 625 MG TABLET    Take 625 mg by mouth daily.   POTASSIUM CHLORIDE (K-DUR,KLOR-CON) 10 MEQ TABLET    Take 10 mEq by mouth 2 (two) times daily.   PRAMIPEXOLE (MIRAPEX) 1 MG TABLET    Take 1 mg by mouth daily.   SERTRALINE (ZOLOFT) 100 MG TABLET    Take 100 mg by mouth daily.   TOPIRAMATE (TOPAMAX) 25 MG TABLET    Take 25 mg by mouth 2 (two) times daily.   TORSEMIDE (DEMADEX) 20 MG TABLET    Take 20 mg by mouth  daily.   TRAMADOL (ULTRAM) 50 MG TABLET    Take by mouth every 8 (eight) hours as needed.  Modified Medications   No medications on file  Discontinued Medications   No medications on file     Physical Exam: Filed Vitals:   10/14/14 1245  BP: 110/60  Pulse: 93  Temp: 96.6 F (35.9 C)  Resp: 18  Weight: 158 lb (71.668 kg)  SpO2: 97%    General- elderly female in no acute distress, frail Head- atraumatic, normocephalic Eyes- PERRLA, EOMI, no pallor, no icterus, no discharge Neck- no cervical lymphadenopathy Throat- moist mucus membrane Cardiovascular- normal s1,s2, no murmurs, good dorsalis pedis Respiratory- bilateral clear to auscultation, no wheeze, no rhonchi, no crackles, no use of accessory muscles Abdomen- bowel sounds present, soft, non tender Musculoskeletal- able to move all 4 extremities, generalized weakness, no leg edema Neurological- no focal deficit Skin- warm and dry Psychiatry- alert and oriented   LABS 10/08/14  WBC 6.0 hemoglobin 10.7 sodium 142 potassium 4.1 BUN 23 creatinine 0.73 calcium 10.1 magnesium 1.90 10/06/14  CK 2.57 trop 0.0 23 10/05/14  . 3.4 bilirubin total 0.55 96 AST 24 ALT 16 TSH<0.01 hemoglobin A1c 5.9  Basic Metabolic Panel:  Recent Labs  04/19/14 1914 05/25/14 2357 06/25/14 2120  NA 141 137 137  K 4.0 3.6* 3.2*  CL 104 98 96  CO2  --  24 24  GLUCOSE 99 115* 157*  BUN 26* 26* 20  CREATININE 1.30* 1.39* 1.00  CALCIUM  --  10.1 11.0*   Liver Function Tests:  Recent Labs  06/25/14 2120  AST 25  ALT 16  ALKPHOS 98  BILITOT 0.4  PROT 6.9  ALBUMIN 3.1*   No results for input(s): LIPASE, AMYLASE in the last 8760 hours. No results for input(s): AMMONIA in the last 8760 hours. CBC:  Recent Labs  04/19/14 1813  04/19/14 1914 05/25/14 2357 06/25/14 2120  WBC 7.1  --   --  6.5 8.8  NEUTROABS 4.0  --   --   --   --   HGB 11.3*  < > 12.2 12.0 12.4  HCT 34.9*  < > 36.0 38.0 38.0  MCV 88.8  --   --  87.4 84.8  PLT  136*  --   --  173 180  < > = values in this interval not displayed. Cardiac Enzymes:  Recent Labs  06/25/14 2120  TROPONINI <0.30    Assessment/Plan  Physical deconditioning Will have her work with physical therapy and occupational therapy team to help with gait training and muscle strengthening exercises.fall precautions. Skin care. Encourage to be out of bed. Will rule out worsening anemia and PMR -check cbc and esr and thyroid  panel  Acute on chronic diastolic CHF Check bnp. No signs of fluid overload on exam. continue Demadex 20 mg daily  HTN Stable on corgard 40 mg daily  Anemia continue iron 325 mg daily. Check cbc  Arthritis Pain controlled. continue Mobic 7.5 mg by mouth daily, Norco 10/325 mg and tramadol prn  Depression continue Zoloft 100 mg daily  Family/ staff Communication: reviewed care plan with patient and nursing supervisor  Goals of care: short term rehabilitation    Labs/tests ordered: cbc, cmp, tsh, free t4, total t3, bnp, esr    Blanchie Serve, MD  [facility] [phone] (Monday-Friday 8 am - 5 pm) [phone] (afterhours)

## 2014-10-15 LAB — HEPATIC FUNCTION PANEL
ALT: 28 U/L (ref 7–35)
AST: 25 U/L (ref 13–35)
Alkaline Phosphatase: 83 U/L (ref 25–125)
Bilirubin, Total: 0.5 mg/dL

## 2014-10-15 LAB — CBC AND DIFFERENTIAL
HEMATOCRIT: 38 % (ref 36–46)
Hemoglobin: 11.6 g/dL — AB (ref 12.0–16.0)
PLATELETS: 138 10*3/uL — AB (ref 150–399)
WBC: 5.3 10*3/mL

## 2014-10-15 LAB — BASIC METABOLIC PANEL
CREATININE: 0.6 mg/dL (ref 0.5–1.1)
Glucose: 114 mg/dL
POTASSIUM: 3.6 mmol/L (ref 3.4–5.3)
Sodium: 144 mmol/L (ref 137–147)

## 2014-10-29 ENCOUNTER — Non-Acute Institutional Stay (SKILLED_NURSING_FACILITY): Payer: Medicare HMO | Admitting: Adult Health

## 2014-10-29 DIAGNOSIS — I5033 Acute on chronic diastolic (congestive) heart failure: Secondary | ICD-10-CM

## 2014-10-29 DIAGNOSIS — F329 Major depressive disorder, single episode, unspecified: Secondary | ICD-10-CM

## 2014-10-29 DIAGNOSIS — R531 Weakness: Secondary | ICD-10-CM

## 2014-10-29 DIAGNOSIS — I10 Essential (primary) hypertension: Secondary | ICD-10-CM

## 2014-10-29 DIAGNOSIS — G2581 Restless legs syndrome: Secondary | ICD-10-CM

## 2014-10-29 DIAGNOSIS — E0590 Thyrotoxicosis, unspecified without thyrotoxic crisis or storm: Secondary | ICD-10-CM

## 2014-10-29 DIAGNOSIS — F32A Depression, unspecified: Secondary | ICD-10-CM

## 2014-10-29 DIAGNOSIS — D509 Iron deficiency anemia, unspecified: Secondary | ICD-10-CM

## 2014-10-29 DIAGNOSIS — M1990 Unspecified osteoarthritis, unspecified site: Secondary | ICD-10-CM

## 2014-11-01 ENCOUNTER — Encounter: Payer: Self-pay | Admitting: Adult Health

## 2014-11-01 NOTE — Progress Notes (Signed)
Patient ID: Katie Evans, female   DOB: 12-04-1945, 68 y.o.   MRN: [id number]   10/29/14  Facility:  Nursing Home Location:  [facility] Room Number: 903-1 LEVEL OF CARE:  SNF (31)   Chief Complaint  Patient presents with  .       Discharge Notes    Chronic diastolic CHF, Anemia, Arthritis, Hypertension, Depression, Hyperthyroidism, Generalized weakness and restless leg syndrome    HISTORY OF PRESENT ILLNESS:   This is a 68 year old female who is for discharge home with home health PT, OT, CNA, nursing and social worker. . [facility] with Acute on chronic diastoli CHF. Patient was admitted to this facility for short-term rehabilitation after the patient's recent hospitalization.  Patient has completed SNF rehabilitation and therapy has cleared the patient for discharge.  REASSESSMENT OF ONGOING PROBLEMS:  CHF:The patient does not relate significant weight changes, denies sob, DOE, orthopnea, PNDs, pedal edema, palpitations or chest pain.  CHF remains stable.  No complications form the medications being used.  ANEMIA: The anemia has been stable. The patient denies fatigue, melena or hematochezia. No complications from the medications currently being used.  11/15 hgb 11.6  HTN: Pt 's HTN remains stable.  Denies CP, sob, DOE, headaches, dizziness or visual disturbances.  No complications from the medications currently being used.  Last BP : 123/69  PAST MEDICAL HISTORY:  Past Medical History  Diagnosis Date  . Renal disorder   . CHF (congestive heart failure)   . Arthritis   . Hypertension   . Carpal tunnel syndrome     CURRENT MEDICATIONS: Reviewed per MAR/see medication list  No Known Allergies   REVIEW OF SYSTEMS:  GENERAL: no change in appetite, no fatigue, no weight changes, no fever, chills or weakness RESPIRATORY: no cough, SOB, DOE, wheezing, hemoptysis CARDIAC: no  chest pain, or palpitations GI: no abdominal pain, diarrhea, constipation, heart burn, nausea or vomiting  PHYSICAL EXAMINATION  GENERAL: no acute distress NECK: supple, trachea midline, no neck masses, no thyroid tenderness, no thyromegaly LYMPHATICS: no LAN in the neck, no supraclavicular LAN RESPIRATORY: breathing is even & unlabored, BS CTAB CARDIAC: RRR, no murmur,no extra heart sounds, no edema GI: abdomen soft, normal BS, no masses, no tenderness, no hepatomegaly, no splenomegaly EXTREMITIES: able to move all 4 extremities; uses walker when ambulating PSYCHIATRIC: the patient is alert & oriented to person, affect & behavior appropriate  LABS/RADIOLOGY: 10/15/14  total T3 308.3 TSH 0.00  Free T4 4.4   WBC 5.3 hemoglobin 11.6 hematocrit 37.7 MCV 86.9 sodium 144 potassium 3.6 glucose 114 BUN 20 creatinine 0.6 calcium 10.6 total protein 5.5 albumin 3.1 total bilirubin 0.5 ALP 83 AST 25 ALT 28 10/08/14  WBC 6.0 hemoglobin 10.7 sodium 142 potassium 4.1 BUN 23 creatinine 0.73 calcium 10.1 magnesium 1.90 10/06/14  CK 2.57 trop 0.0 23 10/05/14  . 3.4 bilirubin total 0.55 96 AST 24 ALT 16 TSH<0.01 hemoglobin A1c 5.9  ASSESSMENT/PLAN:  Acute on chronic diastolic CHF - stable; continue Demadex 20 mg 1 tab by mouth daily Anemia, iron deficiency - stable; hemoglobin 11.6; continue iron 325 mg by mouth daily Arthritis - stable; continue Mobic 7.5 mg by mouth daily, Norco 10/325 mg 1 tab by mouth every 8 hours when necessary and Ultram 50 mg by mouth every 8 hours when necessary Hypertension - well controlled; continue Corgard 40 mg by mouth daily Restless leg syndrome - stable; continue  Mirapex 1 mg by mouth daily Depression - stable; continue Zoloft 100 mg by mouth daily Hyperthyroidism - TSH high =0.00;  follow-up with endocrinologist Generalized weakness - for rehabilitation home health PT, OT, CNA, nursing and social worker   I have filled out patient's discharge paperwork and written  prescriptions.  Patient will receive home health PT, OT, SW, Nursing and CNA.   Total discharge time: Less than 30 minutes  Discharge time involved coordination of the discharge process with Child psychotherapistsocial worker, nursing staff and therapy department. Medical justification for home health services verified.     [facility], NP [facility] [phone]

## 2014-12-06 ENCOUNTER — Non-Acute Institutional Stay (SKILLED_NURSING_FACILITY): Payer: Medicare HMO | Admitting: Adult Health

## 2014-12-06 ENCOUNTER — Encounter: Payer: Self-pay | Admitting: Adult Health

## 2014-12-06 DIAGNOSIS — D509 Iron deficiency anemia, unspecified: Secondary | ICD-10-CM

## 2014-12-06 DIAGNOSIS — F329 Major depressive disorder, single episode, unspecified: Secondary | ICD-10-CM

## 2014-12-06 DIAGNOSIS — E785 Hyperlipidemia, unspecified: Secondary | ICD-10-CM

## 2014-12-06 DIAGNOSIS — I2699 Other pulmonary embolism without acute cor pulmonale: Secondary | ICD-10-CM

## 2014-12-06 DIAGNOSIS — F32A Depression, unspecified: Secondary | ICD-10-CM

## 2014-12-06 DIAGNOSIS — G2581 Restless legs syndrome: Secondary | ICD-10-CM

## 2014-12-06 DIAGNOSIS — I10 Essential (primary) hypertension: Secondary | ICD-10-CM

## 2014-12-06 DIAGNOSIS — F419 Anxiety disorder, unspecified: Secondary | ICD-10-CM

## 2014-12-06 DIAGNOSIS — E876 Hypokalemia: Secondary | ICD-10-CM

## 2014-12-06 DIAGNOSIS — I5033 Acute on chronic diastolic (congestive) heart failure: Secondary | ICD-10-CM

## 2014-12-06 DIAGNOSIS — R531 Weakness: Secondary | ICD-10-CM

## 2014-12-06 NOTE — Progress Notes (Signed)
Patient ID: Katie Roseamela Evans, female   DOB: 14-Jan-1946, 69 y.o.   MRN: [id number]   12/06/2014  Facility:  Nursing Home Location:  [facility] Room Number: 1004-1 LEVEL OF CARE:  SNF (31)   Chief Complaint  Patient presents with  . Hospitalization Follow-up    Generalized weakness, PE, diastolic CHF, hypertension, hyperlipidemia, anxiety, anemia, depression, hypokalemia and restless leg syndrome    HISTORY OF PRESENT ILLNESS:  This is a 69 year old female who was [facility]. She was found on the ground for over 8 hours and unable to get back up. CT PA of the chest revealed solitary acute pulmonary embolism in the right upper lobe. BNP was 1734 and troponin 0.76 - elevated. She was treated with Lovenox for PE, and now on Coumadin, and 2 doses of Lasix. Patient has past medical history of hypertension, hypothyroidism, anemia, diastolic CHF and anxiety. She has been admitted for a short-term rehabilitation.   PAST MEDICAL HISTORY:  Past Medical History  Diagnosis Date  . Renal disorder   . CHF (congestive heart failure)   . Arthritis   . Hypertension   . Carpal tunnel syndrome     CURRENT MEDICATIONS: Reviewed per MAR/see medication list  No Known Allergies   REVIEW OF SYSTEMS:  GENERAL: no change in appetite, no fatigue, no weight changes, no fever, chills or weakness RESPIRATORY: no cough, SOB, DOE, wheezing, hemoptysis CARDIAC: no chest pain,  or palpitations GI: no abdominal pain, diarrhea, constipation, heart burn, nausea or vomiting  PHYSICAL EXAMINATION  GENERAL: no acute distress EYES: conjunctivae normal, sclerae normal, normal eye lids NECK: supple, trachea midline, no neck masses, no thyroid tenderness, no thyromegaly LYMPHATICS: no LAN in the neck, no supraclavicular LAN RESPIRATORY: breathing is even & unlabored, BS CTAB CARDIAC: RRR, no murmur,no extra heart sounds, BLE  edema 1-2+ GI: abdomen soft, normal BS, no masses, no tenderness, no hepatomegaly, no splenomegaly EXTREMITIES: Able to move all 4 extremities PSYCHIATRIC: the patient is alert & oriented to person, affect & behavior appropriate  LABS/RADIOLOGY: 12/02/14  WBC 4.9 hemoglobin 10.1 hematocrit 32.7 12/01/14  sodium 142 potassium 3.7 BUN 20 creatinine 0.70 11/28/14  cholesterol 85 LDL 37 HDL 33 triglycerides 74  ASSESSMENT/PLAN:  Generalized weakness - for rehabilitation Pulmonary embolism - continue Coumadin Chronic CHF - continue Demadex 20 mg by mouth daily Hypertension - well controlled; continue Nadolol 40 mg by mouth daily Hyperlipidemia - continue Lipitor 40 mg by mouth daily at bedtime Anxiety - mood is is stable; continue Klonopin 0.5 mg by mouth daily Depression - continue Zoloft 100 mg by mouth daily Restless leg syndrome - stable; continue Mirapex 1 mg by mouth daily Chronic diarrhea - sister reports patient has been having chronic diarrhea ever C she had a gastric bypass surgery; continue Imodium 2 mg by mouth 4 times a day when necessary Hypokalemia - K3.7; continue KCl 10 ME every by mouth twice a day Anemia, iron deficiency - continue ferrous sulfate 325 mg by mouth daily   Goals of care:  Short-term rehabilitation    Labs/test ordered:  TSH, free T4, CMP, CBC   Spent 50 minutes in patient care.     [facility]ARG[facility],Michaelah Credeur, NP [facility] [phone]

## 2014-12-08 ENCOUNTER — Non-Acute Institutional Stay (SKILLED_NURSING_FACILITY): Payer: Medicare HMO | Admitting: Internal Medicine

## 2014-12-08 ENCOUNTER — Encounter: Payer: Self-pay | Admitting: Internal Medicine

## 2014-12-08 DIAGNOSIS — I10 Essential (primary) hypertension: Secondary | ICD-10-CM

## 2014-12-08 DIAGNOSIS — E0590 Thyrotoxicosis, unspecified without thyrotoxic crisis or storm: Secondary | ICD-10-CM

## 2014-12-08 DIAGNOSIS — F32A Depression, unspecified: Secondary | ICD-10-CM

## 2014-12-08 DIAGNOSIS — K219 Gastro-esophageal reflux disease without esophagitis: Secondary | ICD-10-CM

## 2014-12-08 DIAGNOSIS — G2581 Restless legs syndrome: Secondary | ICD-10-CM

## 2014-12-08 DIAGNOSIS — I5033 Acute on chronic diastolic (congestive) heart failure: Secondary | ICD-10-CM

## 2014-12-08 DIAGNOSIS — E785 Hyperlipidemia, unspecified: Secondary | ICD-10-CM

## 2014-12-08 DIAGNOSIS — J309 Allergic rhinitis, unspecified: Secondary | ICD-10-CM

## 2014-12-08 DIAGNOSIS — I2699 Other pulmonary embolism without acute cor pulmonale: Secondary | ICD-10-CM

## 2014-12-08 DIAGNOSIS — E876 Hypokalemia: Secondary | ICD-10-CM

## 2014-12-08 DIAGNOSIS — D509 Iron deficiency anemia, unspecified: Secondary | ICD-10-CM

## 2014-12-08 DIAGNOSIS — M179 Osteoarthritis of knee, unspecified: Secondary | ICD-10-CM

## 2014-12-08 DIAGNOSIS — R531 Weakness: Secondary | ICD-10-CM

## 2014-12-08 DIAGNOSIS — F329 Major depressive disorder, single episode, unspecified: Secondary | ICD-10-CM

## 2014-12-08 DIAGNOSIS — IMO0002 Reserved for concepts with insufficient information to code with codable children: Secondary | ICD-10-CM

## 2014-12-08 DIAGNOSIS — G9340 Encephalopathy, unspecified: Secondary | ICD-10-CM

## 2014-12-08 DIAGNOSIS — M1710 Unilateral primary osteoarthritis, unspecified knee: Secondary | ICD-10-CM

## 2014-12-08 NOTE — Progress Notes (Signed)
Patient ID: Katie Evans, female   DOB: 1946-07-22, 69 y.o.   MRN: [id number]     [facility]   PCP: CORRINGTON,KIP A, MD  Code Status: full code  No Known Allergies  Chief Complaint  Patient presents with  . New Admit To SNF     HPI:  69 year old patient is here for short term rehabilitation post hospital admission from 11/26/14-12/03/14 with acute pulmonary embolism, chf exacerbation and acute encephalopathy. She was started on lovenox with coumadin and diuresed. EEG showed encephalopathy , other brainimagings were normal She has past medical history of hypertension, hypothyroidism, anemia, diastolic CHF and anxiety.  She is seen in her room today. She mentions that her breathing is stable. She feels weak and tired. She has runny nose and post nasal drainage bothering her. No other concerns. She is alert and oriented.    Review of Systems:  Constitutional: Negative for fever, chills, diaphoresis.  HENT: Negative for congestion Eyes: Negative for double vision and discharge.  Respiratory: Negative for cough, shortness of breath and wheezing.   Cardiovascular: Negative for chest pain, palpitations. Has chronic leg swelling.  Gastrointestinal: Negative for heartburn, nausea, vomiting, abdominal pain, melena, rectal bleed. Has IBS and has loose stools (chronic)    Genitourinary: Negative for dysuria Musculoskeletal: Negative for back pain, falls Skin: Negative for itching, rash.  Neurological: Negative for focal weakness and headaches.  Psychiatric/Behavioral: Negative for depression, insomnia and memory loss. Has anxiety   Past Medical History  Diagnosis Date  . Renal disorder   . CHF (congestive heart failure)   . Arthritis   . Hypertension   . Carpal tunnel syndrome    Past Surgical History  Procedure Laterality Date  . Tonsillectomy    . Appendectomy    . Back surgery    . Gastric bypass    . Shoulder surgery    . Abdominal  hysterectomy    . Spine surgery      spinal fusion   Social History:   reports that she has never smoked. She has never used smokeless tobacco. She reports that she does not drink alcohol or use illicit drugs.  History reviewed. No pertinent family history.  Medications: Patient's Medications  New Prescriptions   No medications on file  Previous Medications   CHOLECALCIFEROL (VITAMIN D-3 PO)    Take 1 tablet by mouth daily.   CLONAZEPAM (KLONOPIN) 1 MG TABLET    Take 1 mg by mouth 2 (two) times daily.    CLOTRIMAZOLE (LOTRIMIN) 1 % CREAM    Apply 1 application topically every 8 (eight) hours.    DICLOFENAC SODIUM (VOLTAREN) 1 % GEL    Apply 4 g topically 4 (four) times daily.   FERROUS SULFATE 325 (65 FE) MG TABLET    Take 325 mg by mouth daily with breakfast.   HYDROCODONE-ACETAMINOPHEN (NORCO) 10-325 MG PER TABLET    Take 1 tablet by mouth every 8 (eight) hours as needed. scheduled   LOPERAMIDE (IMODIUM A-D) 2 MG TABLET    Take 6 mg by mouth 2 (two) times daily.   LORATADINE (CLARITIN) 10 MG TABLET    Take 10 mg by mouth daily.   MELOXICAM (MOBIC) 7.5 MG TABLET    Take 7.5 mg by mouth daily.   NADOLOL (CORGARD) 40 MG TABLET    Take 40 mg by mouth 2 (two) times daily.   NYSTATIN (MYCOSTATIN/NYSTOP) 100000 UNIT/GM POWD    Apply topically 3 (three) times daily as  needed.   POLYCARBOPHIL (FIBERCON) 625 MG TABLET    Take 625 mg by mouth daily.   POTASSIUM CHLORIDE (K-DUR,KLOR-CON) 10 MEQ TABLET    Take 10 mEq by mouth 2 (two) times daily.   PRAMIPEXOLE (MIRAPEX) 1 MG TABLET    Take 1 mg by mouth daily.   SERTRALINE (ZOLOFT) 100 MG TABLET    Take 100 mg by mouth daily.   TOPIRAMATE (TOPAMAX) 25 MG TABLET    Take 25 mg by mouth 2 (two) times daily.   TORSEMIDE (DEMADEX) 20 MG TABLET    Take 20 mg by mouth daily.   TRAMADOL (ULTRAM) 50 MG TABLET    Take by mouth every 8 (eight) hours as needed.  Modified Medications   No medications on file  Discontinued Medications   No medications on  file     Physical Exam: Filed Vitals:   12/08/14 1613  BP: 120/60  Pulse: 66  Temp: 96.2 F (35.7 C)  Resp: 18  SpO2: 95%    General- elderly female in no acute distress, frail Head- atraumatic, normocephalic Eyes- PERRLA, EOMI, no pallor, no icterus, no discharge Neck- no cervical lymphadenopathy Throat- moist mucus membrane Cardiovascular- normal s1,s2, no murmurs, good dorsalis pedis Respiratory- bilateral clear to auscultation, no wheeze, no rhonchi, no crackles, no use of accessory muscles Abdomen- bowel sounds present, soft, non tender Musculoskeletal- able to move all 4 extremities, generalized weakness, has chronic leg edema right> left, uses walker and wheelchair  Neurological- no focal deficit Skin- warm and dry Psychiatry- alert and oriented   LABS 12/02/14  WBC 4.9 hemoglobin 10.1 hematocrit 32.7 12/01/14  sodium 142 potassium 3.7 BUN 20 creatinine 0.70 11/28/14  cholesterol 85 LDL 37 HDL 33 triglycerides 74 12/07/14 wbc 5.2, hb 10.2, hct 31.9, plt 101, na 139, k 3.5, bun 23, cr 0.8, ca 9.7, glu 81, alb 2.6, tsh 0.01, free t4 0.9  Assessment/Plan  Generalized weakness Will have her work with physical therapy and occupational therapy team to help with gait training and muscle strengthening exercises.fall precautions. Skin care. Encourage to be out of bed.   Pulmonary embolism  Breathing stable, continue Coumadin with goal inr 2-3  Chronic CHF continue Demadex 20 mg daily  Hypertension continue Nadolol 40 mg daily  Encephalopathy Seen on EEG and pt on topamax 25 mg bid, continue this  Anxiety continue Klonopin 0.5 mg daily  Allergic rhinitis Continue claritin for now  Restless leg syndrome continue Mirapex 1 mg daily  Hypokalemia continue KCl 10 ME bid  Hyperlipidemia continue Lipitor 40 mg daily  Anemia continue iron 325 mg daily. Check cbc  Arthritis Pain controlled. continue Mobic 7.5 mg by mouth daily, Norco 10/325 mg and tramadol  prn  Depression continue Zoloft 100 mg daily  gerd Continue pepcid 20 mg daily  Subclinical hyperthyroidism Monitor clinically for now with repeat thyroid panel in 6 weeks   Goals of care: short term rehabilitation   Labs/tests ordered: thyroid panel in 6 weeks  Family/ staff Communication: reviewed care plan with patient and nursing supervisor    Oneal GroutMAHIMA Angeleena Dueitt, MD  [facility] [phone] (Monday-Friday 8 am - 5 pm) [phone] (afterhours)

## 2014-12-13 ENCOUNTER — Other Ambulatory Visit: Payer: Self-pay | Admitting: *Deleted

## 2014-12-13 MED ORDER — CLONAZEPAM 0.5 MG PO TABS: ORAL_TABLET | ORAL | Status: DC

## 2014-12-13 NOTE — Telephone Encounter (Signed)
[facility] 

## 2015-01-04 ENCOUNTER — Encounter: Payer: Self-pay | Admitting: Endocrinology

## 2015-02-11 ENCOUNTER — Non-Acute Institutional Stay (SKILLED_NURSING_FACILITY): Payer: Medicare HMO | Admitting: Adult Health

## 2015-02-11 ENCOUNTER — Encounter: Payer: Self-pay | Admitting: Adult Health

## 2015-02-11 DIAGNOSIS — R258 Other abnormal involuntary movements: Secondary | ICD-10-CM | POA: Diagnosis not present

## 2015-02-11 DIAGNOSIS — K219 Gastro-esophageal reflux disease without esophagitis: Secondary | ICD-10-CM | POA: Diagnosis not present

## 2015-02-11 DIAGNOSIS — R531 Weakness: Secondary | ICD-10-CM | POA: Diagnosis not present

## 2015-02-11 DIAGNOSIS — G2581 Restless legs syndrome: Secondary | ICD-10-CM | POA: Diagnosis not present

## 2015-02-11 DIAGNOSIS — E785 Hyperlipidemia, unspecified: Secondary | ICD-10-CM

## 2015-02-11 DIAGNOSIS — F419 Anxiety disorder, unspecified: Secondary | ICD-10-CM

## 2015-02-11 DIAGNOSIS — E43 Unspecified severe protein-calorie malnutrition: Secondary | ICD-10-CM

## 2015-02-11 DIAGNOSIS — I5032 Chronic diastolic (congestive) heart failure: Secondary | ICD-10-CM

## 2015-02-11 DIAGNOSIS — I10 Essential (primary) hypertension: Secondary | ICD-10-CM

## 2015-02-11 DIAGNOSIS — F329 Major depressive disorder, single episode, unspecified: Secondary | ICD-10-CM | POA: Diagnosis not present

## 2015-02-11 DIAGNOSIS — D509 Iron deficiency anemia, unspecified: Secondary | ICD-10-CM

## 2015-02-11 DIAGNOSIS — E876 Hypokalemia: Secondary | ICD-10-CM | POA: Diagnosis not present

## 2015-02-11 DIAGNOSIS — F32A Depression, unspecified: Secondary | ICD-10-CM

## 2015-02-11 DIAGNOSIS — I2699 Other pulmonary embolism without acute cor pulmonale: Secondary | ICD-10-CM | POA: Diagnosis not present

## 2015-02-11 DIAGNOSIS — R251 Tremor, unspecified: Secondary | ICD-10-CM

## 2015-02-11 NOTE — Progress Notes (Addendum)
Patient ID: Katie Roseamela Evans, female   DOB: 12-13-1945, 69 y.o.   MRN: [id number]   02/11/2015  Facility:  Nursing Home Location:  [facility] Room Number: 1004-1 LEVEL OF CARE:  SNF (31)   Chief Complaint  Patient presents with  . Medical Management of Chronic Issues    Generalized weakness, pulmonary embolism, GERD, anemia, hypertension, restless leg syndrome, depression, tremors, anxiety, hypokalemia, hyperlipidemia and diastolic CHF    HISTORY OF PRESENT ILLNESS:  This is a 69 year old female who [facility] from [facility]. She was found on the ground for over 8 hours and unable to get back up. CT PA of the chest revealed solitary acute pulmonary embolism in the right upper lobe. BNP was 1734 and troponin 0.76 - elevated. She was treated with Lovenox for PE, and now on Coumadin, and 2 doses of Lasix. Patient has past medical history of hypertension, hypothyroidism, anemia, diastolic CHF and anxiety. She has been admitted for a short-term rehabilitation.  PAST MEDICAL HISTORY:  Past Medical History  Diagnosis Date  . Renal disorder   . CHF (congestive heart failure)   . Arthritis   . Hypertension   . Carpal tunnel syndrome     CURRENT MEDICATIONS: Reviewed per MAR/see medication list  No Known Allergies   REVIEW OF SYSTEMS:  GENERAL: no change in appetite, no fatigue, no weight changes, no fever, chills or weakness RESPIRATORY: no cough, SOB, DOE, wheezing, hemoptysis CARDIAC: no chest pain,  or palpitations GI: no abdominal pain, diarrhea, constipation, heart burn, nausea or vomiting  PHYSICAL EXAMINATION  GENERAL: no acute distress NECK: supple, trachea midline, no neck masses, no thyroid tenderness, no thyromegaly LYMPHATICS: no LAN in the neck, no supraclavicular LAN RESPIRATORY: breathing is even & unlabored, BS CTAB CARDIAC: RRR, no murmur,no extra heart sounds, BLE edema 2+ GI: abdomen  soft, normal BS, no masses, no tenderness, no hepatomegaly, no splenomegaly EXTREMITIES: Able to move all 4 extremities PSYCHIATRIC: the patient is alert & oriented to person, affect & behavior appropriate  LABS/RADIOLOGY: 02/03/15  WBC 7.5 hemoglobin 9.0 hematocrit 28.4 MCV 95.3 albumin 2.2 01/24/15  sodium 140 potassium 3.7 glucose 86 BUN 23 creatinine 0.89 calcium 9.6 01/21/15  x-ray of right knee shows advanced osteoarthritic changes with loss of joint space. Chronic bone remodeling. No evidence of acute osseous pathology  01/19/15  TSH <0.008 freeT4 4.90  freeT3 17.7 01/13/15  sodium 138 potassium 4.1 glucose 108 BUN 46 creatinine 1.37 calcium 9.7 12/27/14  TSH <0.008  freeT4 5.08  freeT3 19.3 12/07/14  WBC 5.2 hemoglobin 10.2 hematocrit 31.9 MCV 84.4 sodium 139 potassium 3.5 glucose 81 BUN 23 creatinine 0.8 calcium 9.7 total protein 4.6 albumin 2.6 ALP 72 AST 16 GFR>60 freeT4 0.9 12/02/14  WBC 4.9 hemoglobin 10.1 hematocrit 32.7 12/01/14  sodium 142 potassium 3.7 BUN 20 creatinine 0.70 11/28/14  cholesterol 85 LDL 37 HDL 33 triglycerides 74  ASSESSMENT/PLAN:  Generalized weakness -continue rehabilitation Pulmonary embolism - continue Coumadin Chronic diastolic CHF - continue Demadex 20 mg by mouth daily Hypertension - well controlled; continue Nadolol 40 mg by mouth daily Hyperlipidemia - continue Lipitor 40 mg by mouth daily at bedtime Anxiety - mood is is stable; continue Klonopin 0.5 mg by mouth daily Depression - continue Zoloft 150 mg by mouth daily Restless leg syndrome - stable; continue Mirapex 1 mg by mouth daily Chronic diarrhea - sister reports patient has been having chronic diarrhea eversince she had a gastric  bypass surgery; continue Imodium 2 mg by mouth 4 times a day when necessary Hypokalemia - K3.7; continue KCl 10 ME every by mouth twice a day Anemia, iron deficiency - hgb 9.0; continue ferrous sulfate 325 mg by mouth daily  Tremors - continue Topamax 25 mg by mouth twice  a day Protein cholerae malnutrition, severe - albumin 2.2; continue supplementation   Goals of care:  Short-term rehabilitation   Labs/test ordered:  none     [facility], NP [employer] [phone]

## 2015-03-04 ENCOUNTER — Non-Acute Institutional Stay (SKILLED_NURSING_FACILITY): Payer: Medicare HMO | Admitting: Internal Medicine

## 2015-03-04 DIAGNOSIS — L89152 Pressure ulcer of sacral region, stage 2: Secondary | ICD-10-CM

## 2015-03-04 DIAGNOSIS — F411 Generalized anxiety disorder: Secondary | ICD-10-CM

## 2015-03-04 DIAGNOSIS — E785 Hyperlipidemia, unspecified: Secondary | ICD-10-CM | POA: Insufficient documentation

## 2015-03-04 DIAGNOSIS — M79604 Pain in right leg: Secondary | ICD-10-CM | POA: Diagnosis not present

## 2015-03-04 DIAGNOSIS — D509 Iron deficiency anemia, unspecified: Secondary | ICD-10-CM | POA: Diagnosis not present

## 2015-03-04 DIAGNOSIS — E46 Unspecified protein-calorie malnutrition: Secondary | ICD-10-CM | POA: Insufficient documentation

## 2015-03-04 DIAGNOSIS — I2782 Chronic pulmonary embolism: Secondary | ICD-10-CM | POA: Diagnosis not present

## 2015-03-04 DIAGNOSIS — I509 Heart failure, unspecified: Secondary | ICD-10-CM

## 2015-03-04 DIAGNOSIS — J309 Allergic rhinitis, unspecified: Secondary | ICD-10-CM | POA: Diagnosis not present

## 2015-03-04 DIAGNOSIS — R1110 Vomiting, unspecified: Secondary | ICD-10-CM

## 2015-03-04 DIAGNOSIS — E0590 Thyrotoxicosis, unspecified without thyrotoxic crisis or storm: Secondary | ICD-10-CM

## 2015-03-04 DIAGNOSIS — L98429 Non-pressure chronic ulcer of back with unspecified severity: Secondary | ICD-10-CM

## 2015-03-04 DIAGNOSIS — R630 Anorexia: Secondary | ICD-10-CM | POA: Diagnosis not present

## 2015-03-04 NOTE — Progress Notes (Signed)
Patient ID: Katie Evans, female   DOB: Jan 21, 1946, 69 y.o.   MRN: [id number]    [facility]  Chief Complaint  Patient presents with  . Medical Management of Chronic Issues   No Known Allergies  HPI 69 y/o female patient is a long term care resident here. She has history of acute PE, CHF, hypertension, hypothyroidism, anemia, anxiety. She is seen in her room today. Her bretahing is stable. She has pain in her right leg coming from her lower back. Denies numbness or tingling but has pain. Her allergies are under control bp reading has been on lower side. Her nerves have been calm. She is taking her iron supplement and cholesterol medication. No falls reported. She has been getting skin care for her sacral pressure ulcer and staff mention that it has been improving. She complaints of vomiting around once a week for 5 weeks. Denies nausea or abdominal pain. Has hx of gastric bypass. Denies any diarrhea, last bowel movement yesterday. Her appetite has been poor  Review of Systems:  Constitutional: Negative for fever, chills, diaphoresis.   HENT: Negative for congestion Eyes: Negative for double vision and discharge.   Respiratory: Negative for cough, shortness of breath and wheezing.    Cardiovascular: Negative for chest pain, palpitations. Has chronic leg swelling.   Gastrointestinal: Negative for heartburn, nausea, abdominal pain. Has IBS and has loose stools (chronic), on prn imodium Genitourinary: Negative for dysuria Musculoskeletal: Negative for falls Skin: Negative for itching, rash.   Neurological: Negative for focal weakness and headaches.   Psychiatric/Behavioral: Negative for depression  Past Medical History  Diagnosis Date  . Renal disorder   . CHF (congestive heart failure)   . Arthritis   . Hypertension   . Carpal tunnel syndrome      Medication List       This list is accurate as of: 03/04/15  2:29 PM.  Always use your most recent  med list.               aspirin 81 MG tablet  Take 81 mg by mouth daily.     atorvastatin 40 MG tablet  Commonly known as:  LIPITOR  Take 40 mg by mouth daily.     clonazePAM 0.5 MG tablet  Commonly known as:  KLONOPIN  Take 0.5 mg by mouth. 1 tab daily and additional half a tab a day as needed for anxiety     DECUBI-VITE PO  Take 1 capsule by mouth daily.     famotidine 20 MG tablet  Commonly known as:  PEPCID  Take 20 mg by mouth 2 (two) times daily.     ferrous sulfate 325 (65 FE) MG tablet  Take 325 mg by mouth daily with breakfast.     HYDROcodone-acetaminophen 5-325 MG per tablet  Commonly known as:  NORCO/VICODIN  Take 1 tablet by mouth every 6 (six) hours as needed for moderate pain.     IMODIUM A-D 2 MG tablet  Generic drug:  loperamide  Take 2 mg by mouth 4 (four) times daily as needed.     nadolol 40 MG tablet  Commonly known as:  CORGARD  Take 40 mg by mouth daily.     polycarbophil 625 MG tablet  Commonly known as:  FIBERCON  Take 625 mg by mouth daily.     potassium chloride 10 MEQ tablet  Commonly known as:  K-DUR,KLOR-CON  Take 10 mEq by mouth 2 (two) times daily.  pramipexole 1 MG tablet  Commonly known as:  MIRAPEX  Take 1 mg by mouth daily.     sertraline 100 MG tablet  Commonly known as:  ZOLOFT  Take 150 mg by mouth daily.     topiramate 25 MG tablet  Commonly known as:  TOPAMAX  Take 25 mg by mouth 2 (two) times daily.     torsemide 20 MG tablet  Commonly known as:  DEMADEX  Take 20 mg by mouth daily.     VITAMIN D-3 PO  Take 1 tablet by mouth daily. 1000 u     warfarin 1 MG tablet  Commonly known as:  COUMADIN  Take 1.5 mg by mouth daily.       Physical exam BP 101/57 mmHg  Pulse 78  Temp(Src) 97.1 F (36.2 C)  Resp 18  Wt 132 lb 3.2 oz (59.966 kg)  SpO2 98%  Wt Readings from Last 3 Encounters:  03/04/15 132 lb 3.2 oz (59.966 kg)  02/11/15 144 lb 6.4 oz (65.499 kg)  12/06/14 141 lb 9.6 oz (64.229 kg)    General- elderly female in no acute distress, frail Head- atraumatic, normocephalic Eyes- PERRLA, EOMI, no pallor, no icterus, no discharge Neck- no cervical lymphadenopathy Throat- moist mucus membrane Cardiovascular- normal s1,s2, no murmurs, good dorsalis pedis Respiratory- bilateral clear to auscultation, no wheeze, no rhonchi, no crackles, no use of accessory muscles Abdomen- bowel sounds present, soft, non tender Musculoskeletal- able to move all 4 extremities, generalized weakness, has chronic leg edema right> left, uses walker and wheelchair, no psinal tenderness but has right lower paraspinal tenderness Neurological- no focal deficit Skin- warm and dry, stage 2 sacral pressure ulcer Psychiatry- alert and oriented   LABS 02/03/15 wbc 7.5, hb 9, hct 28.4, plt 398, alb 2.2 01/24/15 na 140, k 3.7, bun 23, cr 0.89, ca 9.6, glu 86 01/19/15 tsh < 0.008, free t3 17.7, t4 4.90 12/02/14  WBC 4.9 hemoglobin 10.1 hematocrit 32.7 12/01/14  sodium 142 potassium 3.7 BUN 20 creatinine 0.70 11/28/14  cholesterol 85 LDL 37 HDL 33 triglycerides 74 12/07/14 wbc 5.2, hb 10.2, hct 31.9, plt 101, na 139, k 3.5, bun 23, cr 0.8, ca 9.7, glu 81, alb 2.6, tsh 0.01, free t4 0.9  Assessment/Plan  Intermittent vomiting Labs stable in march. No symptoms at present. Clinically stable, unclear of etiology, monitor clinically for now. Advised on small portion meals given her hx of gastric bypass.  Protein calorie malnutrition Has had > 10 lbs weight loss over last month. Has poor po intake. Her hyperthyroidism could be contributing to this. Will refer her to endocrinology. Also encourage po intake with fortified food and mighty shakes  Poor appetite Add remenron 7.5 mg daily to help stimulate appetite  Stage 2 sacral pressure ulcer Continue pressure ulcer prophylaxis, wound care. Continue decubivite  Right leg pain With right paraspinal tenderness on exam. Concern of sciatica. On norco 5-325 1 tab q6h prn  for pain. 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

## 2015-03-18 ENCOUNTER — Non-Acute Institutional Stay (SKILLED_NURSING_FACILITY): Payer: Medicare HMO | Admitting: Internal Medicine

## 2015-03-18 DIAGNOSIS — N898 Other specified noninflammatory disorders of vagina: Secondary | ICD-10-CM | POA: Diagnosis not present

## 2015-03-18 DIAGNOSIS — R443 Hallucinations, unspecified: Secondary | ICD-10-CM | POA: Diagnosis not present

## 2015-03-18 DIAGNOSIS — R410 Disorientation, unspecified: Secondary | ICD-10-CM

## 2015-03-18 DIAGNOSIS — T7421XA Adult sexual abuse, confirmed, initial encounter: Secondary | ICD-10-CM | POA: Diagnosis not present

## 2015-03-18 DIAGNOSIS — E0590 Thyrotoxicosis, unspecified without thyrotoxic crisis or storm: Secondary | ICD-10-CM | POA: Diagnosis not present

## 2015-03-18 DIAGNOSIS — D509 Iron deficiency anemia, unspecified: Secondary | ICD-10-CM

## 2015-03-18 DIAGNOSIS — D696 Thrombocytopenia, unspecified: Secondary | ICD-10-CM | POA: Diagnosis not present

## 2015-03-18 DIAGNOSIS — R1110 Vomiting, unspecified: Secondary | ICD-10-CM
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
## 2015-04-04 ENCOUNTER — Other Ambulatory Visit: Payer: Self-pay | Admitting: *Deleted

## 2015-04-04 MED ORDER — HYDROCODONE-ACETAMINOPHEN 5-325 MG PO TABS: ORAL_TABLET | ORAL | Status: DC

## 2015-04-04 NOTE — Telephone Encounter (Signed)
[facility] 

## 2015-05-10 ENCOUNTER — Emergency Department (HOSPITAL_COMMUNITY): Payer: Medicare HMO

## 2015-05-10 ENCOUNTER — Emergency Department (HOSPITAL_COMMUNITY)
Admission: EM | Admit: 2015-05-10 | Discharge: 2015-05-10 | Disposition: A | Discharge status: Other Acute Inpatient Hospital | Payer: Medicare HMO | Attending: Emergency Medicine | Admitting: Emergency Medicine

## 2015-05-10 DIAGNOSIS — M1990 Unspecified osteoarthritis, unspecified site: Secondary | ICD-10-CM | POA: Diagnosis not present

## 2015-05-10 DIAGNOSIS — R079 Chest pain, unspecified: Secondary | ICD-10-CM | POA: Diagnosis not present

## 2015-05-10 DIAGNOSIS — Z79899 Other long term (current) drug therapy: Secondary | ICD-10-CM | POA: Insufficient documentation

## 2015-05-10 DIAGNOSIS — Z87448 Personal history of other diseases of urinary system: Secondary | ICD-10-CM | POA: Insufficient documentation

## 2015-05-10 DIAGNOSIS — I10 Essential (primary) hypertension: Secondary | ICD-10-CM | POA: Insufficient documentation

## 2015-05-10 DIAGNOSIS — Z8669 Personal history of other diseases of the nervous system and sense organs: Secondary | ICD-10-CM | POA: Diagnosis not present

## 2015-05-10 DIAGNOSIS — Z7901 Long term (current) use of anticoagulants: Secondary | ICD-10-CM | POA: Insufficient documentation

## 2015-05-10 DIAGNOSIS — I509 Heart failure, unspecified: Secondary | ICD-10-CM | POA: Insufficient documentation

## 2015-05-10 DIAGNOSIS — Z7982 Long term (current) use of aspirin: Secondary | ICD-10-CM | POA: Diagnosis not present

## 2015-05-10 LAB — CBC WITH DIFFERENTIAL/PLATELET
Basophils Absolute: 0 10*3/uL (ref 0.0–0.1)
Basophils Relative: 0 % (ref 0–1)
Eosinophils Absolute: 0.1 10*3/uL (ref 0.0–0.7)
Eosinophils Relative: 2 % (ref 0–5)
HCT: 30.5 % — ABNORMAL LOW (ref 36.0–46.0)
Hemoglobin: 9.7 g/dL — ABNORMAL LOW (ref 12.0–15.0)
LYMPHS ABS: 2.1 10*3/uL (ref 0.7–4.0)
LYMPHS PCT: 29 % (ref 12–46)
MCH: 27.7 pg (ref 26.0–34.0)
MCHC: 31.8 g/dL (ref 30.0–36.0)
MCV: 87.1 fL (ref 78.0–100.0)
MONOS PCT: 14 % — AB (ref 3–12)
Monocytes Absolute: 1 10*3/uL (ref 0.1–1.0)
NEUTROS ABS: 3.9 10*3/uL (ref 1.7–7.7)
Neutrophils Relative %: 55 % (ref 43–77)
Platelets: 157 10*3/uL (ref 150–400)
RBC: 3.5 MIL/uL — ABNORMAL LOW (ref 3.87–5.11)
RDW: 15.3 % (ref 11.5–15.5)
WBC: 7.2 10*3/uL (ref 4.0–10.5)

## 2015-05-10 LAB — BASIC METABOLIC PANEL
Anion gap: 6 (ref 5–15)
BUN: 26 mg/dL — ABNORMAL HIGH (ref 6–20)
CO2: 27 mmol/L (ref 22–32)
CREATININE: 1.03 mg/dL — AB (ref 0.44–1.00)
Calcium: 9.4 mg/dL (ref 8.9–10.3)
Chloride: 102 mmol/L (ref 101–111)
GFR calc Af Amer: >60 mL/min (ref >60)
GFR calc non Af Amer: 55 mL/min — ABNORMAL LOW (ref >60)
GLUCOSE: 103 mg/dL — AB (ref 65–99)
POTASSIUM: 3.7 mmol/L (ref 3.5–5.1)
Sodium: 135 mmol/L (ref 135–145)

## 2015-05-10 LAB — I-STAT TROPONIN, ED
Troponin i, poc: 0 ng/mL (ref 0.00–0.08)
Troponin i, poc: 0.01 ng/mL (ref 0.00–0.08)

## 2015-05-10 MED ORDER — ONDANSETRON 4 MG PO TBDP
4.0000 mg | ORAL_TABLET | Freq: Once | ORAL | Status: AC
Start: 2015-05-10 — End: 2015-05-10
  Administered 2015-05-10: 4 mg via ORAL
  Filled 2015-05-10: qty 1

## 2015-05-10 MED ORDER — OXYBUTYNIN CHLORIDE 5 MG PO TABS
2.5000 mg | ORAL_TABLET | Freq: Three times a day (TID) | ORAL | Status: DC
  Administered 2015-05-10: 2.5 mg via ORAL
  Filled 2015-05-10 (×3): qty 0.5

## 2015-05-10 MED ORDER — OXYBUTYNIN CHLORIDE 5 MG PO TABS: 2.5000 mg | ORAL_TABLET | Freq: Three times a day (TID) | ORAL | Status: DC | PRN

## 2015-05-10 MED ORDER — HYDROCODONE-ACETAMINOPHEN 5-325 MG PO TABS: 2.0000 | ORAL_TABLET | ORAL | Status: DC | PRN

## 2015-05-10 MED ORDER — HYDROCODONE-ACETAMINOPHEN 5-325 MG PO TABS
1.0000 | ORAL_TABLET | Freq: Once | ORAL | Status: AC
  Administered 2015-05-10: 1 via ORAL
  Filled 2015-05-10: qty 1

## 2015-05-10 NOTE — ED Notes (Signed)
Patient transported to X-ray 

## 2015-05-10 NOTE — Discharge Instructions (Signed)
Avoid activities that make you pain worse. [place] for pain. Return to ER with worsening or changing symptoms.   Chest Wall Pain Chest wall pain is pain in or around the bones and muscles of your chest. It may take up to 6 weeks to get better. It may take longer if you must stay physically active in your work and activities.  CAUSES  Chest wall pain may happen on its own. However, it may be caused by:  A viral illness like the flu.  Injury.  Coughing.  Exercise.  Arthritis.  Fibromyalgia.  Shingles. HOME CARE INSTRUCTIONS   Avoid overtiring physical activity. Try not to strain or perform activities that cause pain. This includes any activities using your chest or your abdominal and side muscles, especially if heavy weights are used.  Put ice on the sore area.  Put ice in a plastic bag.  Place a towel between your skin and the bag.  Leave the ice on for 15-20 minutes per hour while awake for the first 2 days.  Only take over-the-counter or prescription medicines for pain, discomfort, or fever as directed by your caregiver. SEEK IMMEDIATE MEDICAL CARE IF:   Your pain increases, or you are very uncomfortable.  You have a fever.  Your chest pain becomes worse.  You have new, unexplained symptoms.  You have nausea or vomiting.  You feel sweaty or lightheaded.  You have a cough with phlegm (sputum), or you cough up blood. MAKE SURE YOU:   Understand these instructions.  Will watch your condition.  Will get help right away if you are not doing well or get worse. Document Released: 11/12/2005 Document Revised: 02/04/2012 Document Reviewed: 07/09/2011 [facility] Patient Information 2015 [place], [place]. This information is not intended to replace advice given to you by your health care provider. Make sure you discuss any questions you have with your health care provider.

## 2015-05-10 NOTE — ED Notes (Signed)
I gave the patient barrier cream to put on her bottom.

## 2015-05-10 NOTE — ED Notes (Signed)
Patient C/ O chest pain that began at 0600 this AM.  Patient from [facility].  She was given 325 mg asprin and sl ntg X 4.  EMS called.  Pain is improved. Patient states that she overexerted herself yesterday.

## 2015-05-10 NOTE — ED Provider Notes (Signed)
CSN: [id number]     Arrival date & time 05/10/15  4098 History   First MD Initiated Contact with Patient 05/10/15 [phone]     Chief Complaint  Patient presents with  . Chest Pain      HPI  CC:  "This muscle in my chest hurts."  She presents for evaluation of pain in her left chest. She states that on Friday her wheelchair broke. She states at [facility], and extended care facility. She is normally is wheelchair to and from her meals, and around her apartment/rhythm. She states she is having to walk using a walker. She states over the weekend starting on Friday she developed pain in the left side of her chest. Hurts to touch on. Hurts to lift or move her arm. Hurts to lift and use her walker.  A similar sharp. She was given aspirin nitroglycerin. She states she did not feel immediate improvement with this. She missed that she got somewhat upset this morning when they told her that they should have been able to get her a chair on Friday, but they did not. She states after she calmed down her pain seemed to get better. She states it is minimal now. She felt it almost all evening yesterday. It was present at 445 this morning when she awakened and has persisted all morning.  Treatment congestive heart failure for last 4 years. Does not have known coronary artery disease and has not had an angiogram in the past.  Past Medical History  Diagnosis Date  . Renal disorder   . CHF (congestive heart failure)   . Arthritis   . Hypertension   . Carpal tunnel syndrome    Past Surgical History  Procedure Laterality Date  . Tonsillectomy    . Appendectomy    . Back surgery    . Gastric bypass    . Shoulder surgery    . Abdominal hysterectomy    . Spine surgery      spinal fusion   No family history on file. History  Substance Use Topics  . Smoking status: Never Smoker   . Smokeless tobacco: Never Used  . Alcohol Use: No   OB History    No data available     Review of Systems    Constitutional: Negative for fever, chills, diaphoresis, appetite change and fatigue.  HENT: Negative for mouth sores, sore throat and trouble swallowing.   Eyes: Negative for visual disturbance.  Respiratory: Negative for cough, chest tightness, shortness of breath and wheezing.   Cardiovascular: Positive for chest pain.  Gastrointestinal: Negative for nausea, vomiting, abdominal pain, diarrhea and abdominal distention.  Endocrine: Negative for polydipsia, polyphagia and polyuria.  Genitourinary: Negative for dysuria, frequency and hematuria.  Musculoskeletal: Negative for gait problem.  Skin: Negative for color change, pallor and rash.  Neurological: Negative for dizziness, syncope, light-headedness and headaches.  Hematological: Does not bruise/bleed easily.  Psychiatric/Behavioral: Negative for behavioral problems and confusion.      Allergies  Review of patient's allergies indicates no known allergies.  Home Medications   Prior to Admission medications   Medication Sig Start Date End Date Taking? Authorizing Provider  aspirin 81 MG tablet Take 81 mg by mouth daily.    Historical Provider, MD  atorvastatin (LIPITOR) 40 MG tablet Take 40 mg by mouth. Take 1 tablet by mouth every night at bedtime for HLD    Historical Provider, MD  Cholecalciferol (VITAMIN D-3 PO) Take 1 tablet by mouth daily. 1000 u  Historical Provider, MD  clonazePAM (KLONOPIN) 0.5 MG tablet Take 0.5 mg by mouth. Take 1/2 tablet = 0.25 mg by mouth daily as needed for anxiety prn and take 0.5 by mouth every night at midnight    Historical Provider, MD  famotidine (PEPCID) 20 MG tablet Take 20 mg by mouth 2 (two) times daily.    Historical Provider, MD  ferrous sulfate 325 (65 FE) MG tablet Take 325 mg by mouth daily with breakfast.    Historical Provider, MD  HYDROcodone-acetaminophen (NORCO/VICODIN) 5-325 MG per tablet Take one tablet by mouth every six hours as needed for pain. Do not exceed 4gm in 24 hours of  Tylenol 04/04/15   Sharon Seller, NP  loperamide (IMODIUM A-D) 2 MG tablet Take 2 mg by mouth 4 (four) times daily as needed.     Historical Provider, MD  loratadine (CLARITIN) 10 MG tablet Take 10 mg by mouth daily.    Historical Provider, MD  Multiple Vitamins-Minerals (DECUBI-VITE PO) Take 1 capsule by mouth daily.    Historical Provider, MD  nadolol (CORGARD) 40 MG tablet Take 40 mg by mouth daily.     Historical Provider, MD  nitroGLYCERIN (NITROSTAT) 0.4 MG SL tablet Place 0.4 mg under the tongue every 5 (five) minutes as needed for chest pain.    Historical Provider, MD  polycarbophil (FIBERCON) 625 MG tablet Take 625 mg by mouth daily. For chronic diarrhea    Historical Provider, MD  potassium chloride (K-DUR,KLOR-CON) 10 MEQ tablet Take 10 mEq by mouth 2 (two) times daily.    Historical Provider, MD  pramipexole (MIRAPEX) 1 MG tablet Take 1 mg by mouth daily.    Historical Provider, MD  Protein (PROCEL) POWD Take by mouth. Administer 2 scoops by mouth 4 times a day for nutritional support    Historical Provider, MD  sertraline (ZOLOFT) 100 MG tablet Take 100 mg by mouth daily. For drepression    Historical Provider, MD  topiramate (TOPAMAX) 25 MG tablet Take 25 mg by mouth 2 (two) times daily.    Historical Provider, MD  torsemide (DEMADEX) 20 MG tablet Take 20 mg by mouth daily.    Historical Provider, MD  warfarin (COUMADIN) 1 MG tablet Take 1 mg by mouth daily.     Historical Provider, MD   BP 124/65 mmHg  Pulse 70  Temp(Src) 97.7 F (36.5 C) (Oral)  Resp 21  Ht 5\' 3"  (1.6 m)  Wt 125 lb (56.7 kg)  BMI 22.15 kg/m2  SpO2 100% Physical Exam  Constitutional: She is oriented to person, place, and time. She appears well-developed and well-nourished. No distress.  HENT:  Head: Normocephalic.  Eyes: Conjunctivae are normal. Pupils are equal, round, and reactive to light. No scleral icterus.  Neck: Normal range of motion. Neck supple. No thyromegaly present.  Cardiovascular: Normal  rate and regular rhythm.  Exam reveals no gallop and no friction rub.   No murmur heard. Pulmonary/Chest: Effort normal and breath sounds normal. No respiratory distress. She has no wheezes. She has no rales.    Abdominal: Soft. Bowel sounds are normal. She exhibits no distension. There is no tenderness. There is no rebound.  Musculoskeletal: Normal range of motion.  Neurological: She is alert and oriented to person, place, and time.  Skin: Skin is warm and dry. No rash noted.  Psychiatric: She has a normal mood and affect. Her behavior is normal.    ED Course  Procedures (including critical care time) Labs Review Labs Reviewed  CBC  WITH DIFFERENTIAL/PLATELET  BASIC METABOLIC PANEL  I-STAT TROPOININ, ED    Imaging Review No results found.   EKG Interpretation   Date/Time:  Tuesday May 10 2015 09:55:37 EDT Ventricular Rate:  73 PR Interval:  173 QRS Duration: 88 QT Interval:  422 QTC Calculation: 465 R Axis:   -34 Text Interpretation:  Sinus rhythm Multiple ventricular premature  complexes Left axis deviation Confirmed by Fayrene Fearing  MD, Deanta Mincey ([id number]) on  05/10/2015 10:23:03 AM      MDM   Final diagnoses:  Chest pain    Reassuring studies here. Essentially symptom free. I think her pain is chest wall related to recent activity with her walker. It is reproducible with activities using her pectoralis or upper extremities. Think she is appropriate for outpatient treatment. Avoid activities cause or worsening symptoms. Return if worsening symptoms or change in her symptoms. Vicodin for pain.    Rolland Porter, MD 05/10/15 [phone]

## 2015-06-03 ENCOUNTER — Encounter: Payer: Self-pay | Admitting: Adult Health

## 2015-06-03 ENCOUNTER — Non-Acute Institutional Stay (SKILLED_NURSING_FACILITY): Payer: Medicare HMO | Admitting: Adult Health

## 2015-06-03 DIAGNOSIS — J309 Allergic rhinitis, unspecified: Secondary | ICD-10-CM | POA: Diagnosis not present

## 2015-06-03 DIAGNOSIS — K219 Gastro-esophageal reflux disease without esophagitis: Secondary | ICD-10-CM | POA: Diagnosis not present

## 2015-06-03 DIAGNOSIS — N390 Urinary tract infection, site not specified: Secondary | ICD-10-CM

## 2015-06-03 DIAGNOSIS — E43 Unspecified severe protein-calorie malnutrition: Secondary | ICD-10-CM | POA: Diagnosis not present

## 2015-06-03 DIAGNOSIS — F329 Major depressive disorder, single episode, unspecified: Secondary | ICD-10-CM

## 2015-06-03 DIAGNOSIS — E785 Hyperlipidemia, unspecified: Secondary | ICD-10-CM | POA: Diagnosis not present

## 2015-06-03 DIAGNOSIS — E0590 Thyrotoxicosis, unspecified without thyrotoxic crisis or storm: Secondary | ICD-10-CM

## 2015-06-03 DIAGNOSIS — I2782 Chronic pulmonary embolism: Secondary | ICD-10-CM

## 2015-06-03 DIAGNOSIS — R1110 Vomiting, unspecified: Secondary | ICD-10-CM | POA: Diagnosis not present

## 2015-06-03 DIAGNOSIS — I5032 Chronic diastolic (congestive) heart failure: Secondary | ICD-10-CM

## 2015-06-03 DIAGNOSIS — F411 Generalized anxiety disorder: Secondary | ICD-10-CM | POA: Diagnosis not present

## 2015-06-03 DIAGNOSIS — I10 Essential (primary) hypertension: Secondary | ICD-10-CM | POA: Diagnosis not present

## 2015-06-03 DIAGNOSIS — D509 Iron deficiency anemia, unspecified: Secondary | ICD-10-CM

## 2015-06-03 DIAGNOSIS — M79604 Pain in right leg: Secondary | ICD-10-CM

## 2015-06-03 DIAGNOSIS — F32A Depression, unspecified: Secondary | ICD-10-CM

## 2015-06-03 DIAGNOSIS — E876 Hypokalemia: Secondary | ICD-10-CM | POA: Diagnosis not present

## 2015-06-03 DIAGNOSIS — N3281 Overactive bladder: Secondary | ICD-10-CM

## 2015-06-03 DIAGNOSIS — Z7901 Long term (current) use of anticoagulants: Secondary | ICD-10-CM

## 2015-06-03 DIAGNOSIS — Z79890 Hormone replacement therapy (postmenopausal): Secondary | ICD-10-CM

## 2015-06-05 NOTE — Progress Notes (Signed)
Patient ID: Katie Evans, female   DOB: 1946/03/27, 69 y.o.   MRN: [id number]   06/03/15  Facility:  Nursing Home Location:  [facility] Room Number: 1005-2 LEVEL OF CARE:  SNF (31)   Chief Complaint  Patient presents with  . Medical Management of Chronic Issues    Pulmonary embolism, long-term use of anticoagulation, intermittent vomiting, protein calorie malnutrition, right leg pain, diastolic CHF, hyperthyroidism, anxiety, anemia, allergic rhinitis, hyperlipidemia, GERD, HRT, hypertension, depression, UTI and overactive bladder      HISTORY OF PRESENT ILLNESS:  This is a 69 year old female who is being seen for a routine visit. She has been admitted to [facility] on 12/03/14 from [facility]. She was found on the ground for over 8 hours and unable to get back up. CT PA of the chest revealed solitary acute pulmonary embolism in the right upper lobe. BNP was 1734 and troponin 0.76 - elevated. She was treated with Lovenox for PE, and now on Coumadin, and 2 doses of Lasix. Patient has past medical history of hypertension, hypothyroidism, anemia, diastolic CHF and anxiety.   She is now a long term care resident of [address]  She is currently on antibiotic, Macribid, for UTI. Latest IN 1.6, subtherapeutic. She takes Coumadin for PE. No SOB. She has Diastolic chf which is stable. Mood is stable and currently on Klonopin.  PAST MEDICAL HISTORY:  Past Medical History  Diagnosis Date  . Renal disorder   . CHF (congestive heart failure)   . Arthritis   . Hypertension   . Carpal tunnel syndrome     CURRENT MEDICATIONS: Reviewed per MAR/see medication list  No Known Allergies   REVIEW OF SYSTEMS:  GENERAL: no change in appetite, no fatigue, no weight changes, no fever, chills or weakness RESPIRATORY: no cough, SOB, DOE, wheezing, hemoptysis CARDIAC: no chest pain,  or palpitations GI: no abdominal pain, heart burn  PHYSICAL  EXAMINATION  GENERAL: no acute distress EYES: conjunctivae normal, sclerae normal, normal eye lids NECK: supple, trachea midline, no neck masses, no thyroid tenderness, no thyromegaly LYMPHATICS: no LAN in the neck, no supraclavicular LAN RESPIRATORY: breathing is even & unlabored, BS CTAB CARDIAC: RRR, no murmur,no extra heart sounds, BLE edema 2+ GI: abdomen soft, normal BS, no masses, no tenderness, no hepatomegaly, no splenomegaly EXTREMITIES: Able to move all 4 extremities PSYCHIATRIC: the patient is alert & oriented to person, affect & behavior appropriate  LABS/RADIOLOGY: 05/28/15  urine culture shows >= 100,000 colonies/ML Klebsiella pneumoniae 05/18/15  total T4 2 0.6 TSH <0.008 05/09/15  WBC 8.1 hemoglobin 10.2 hematocrit 32.3 MCV 87.3 platelets 177 sodium 138 potassium 3.6 glucose 91 BUN 27 creatinine 1.09 calcium 9.4 04/29/15  WBC 5.7 hemoglobin 8.2 hematocrit 25.5 MCV 85.6 platelet 98 sodium 140 potassium 3.3 glucose 108 BUN 25 creatinine 1.13 total bilirubin 0.5 alkaline phosphatase 117 SGOT 30 SGPT 22 total protein 4.4 albumin 2.3 calcium 8.9 cholesterol 71 triglycerides 42 HDL 47 LDL 16 TSH <0.008 ferritin 292 04/12/15  sodium 139 potassium 3.5 glucose 104 BUN 26 creatinine 0.76 calcium 9.4 04/07/15  WBC 4.8 hemoglobin 9.1 hematocrit 28.9 MCV 85.3 platelet 95 03/30/15  WBC 5.9 hemoglobin 8.7 hematocrit 27.1 MCV 82.4 platelets 72 03/29/15  sodium 142 potassium 2.9 glucose 90 BUN 24 creatinine 0.80 calcium 9.0  03/21/15  WBC 5.3 hemoglobin 8.9 hematocrit 27.3 MCV 81.5 platelet 94 sodium 139 potassium 2.9 glucose 86 BUN 19 creatinine 0.98 total bilirubin 0.8 alkaline phosphatase 120 SGOT 71 SGPT  38 total protein 4.3 albumin 9.2 02/03/15  WBC 7.5 hemoglobin 9.0 hematocrit 28.4 MCV 95.3 albumin 2.2 01/24/15  sodium 140 potassium 3.7 glucose 86 BUN 23 creatinine 0.89 calcium 9.6 01/21/15  x-ray of right knee shows advanced osteoarthritic changes with loss of joint space. Chronic bone  remodeling. No evidence of acute osseous pathology  01/19/15  TSH <0.008 freeT4 4.90  freeT3 17.7 01/13/15  sodium 138 potassium 4.1 glucose 108 BUN 46 creatinine 1.37 calcium 9.7 12/27/14  TSH <0.008  freeT4 5.08  freeT3 19.3 12/07/14  WBC 5.2 hemoglobin 10.2 hematocrit 31.9 MCV 84.4 sodium 139 potassium 3.5 glucose 81 BUN 23 creatinine 0.8 calcium 9.7 total protein 4.6 albumin 2.6 ALP 72 AST 16 GFR>60 freeT4 0.9 12/02/14  WBC 4.9 hemoglobin 10.1 hematocrit 32.7 12/01/14  sodium 142 potassium 3.7 BUN 20 creatinine 0.70 11/28/14  cholesterol 85 LDL 37 HDL 33 triglycerides 74  ASSESSMENT/PLAN:  Pulmonary embolism - continue Coumadin  Long-term use of anticoagulation - INR 1.6, subtherapeutic.. INR goal 2-3;  Increase Coumadin to 5.5 mg PO Q D and check INR on 06/07/15  Chronic diastolic CHF - continue Demadex 20 mg 2 tabs = 40 mg by mouth daily; weigh Q Mondays and Wednesdays  Hypertension - well controlled; continue Nadolol 40 mg by mouth daily  Hyperlipidemia - continue Lipitor 40 mg by mouth daily at bedtime  Anxiety - mood is is stable; continue Klonopin 1 mg by mouth daily and Klonopin 0.5 1/2 tab = 0.25 mg PO Q D PRN  Depression - continue Zoloft 150 mg by mouth daily  Chronic diarrhea -hx of gastric bypass surgery; continue Imodium 2 mg by mouth 4 times a day when necessary  Hypokalemia - K3.6; continue KCl 20 MEQ  ER  by mouth twice a day  Anemia, iron deficiency - hgb 10.2 ; continue ferrous sulfate 325 mg by mouth BID   Protein cholerae malnutrition, severe - albumin 2.2; continue supplementation  Overactive Bladder - continue Oxybutynin 5 mg 1 tab PO TID PRN  GERD - continue Pepcid 20 mg 1 tab PO Q D  HRT - continue Premarin 0.625 mg cream insert vaginally Q HS  Intermittent vomiting - hx of gastric bypass; continue Reglan 5 mg 1 tab by mouth TIDac  Right leg pain - continue Norco 5/325 mg 1 tab by mouth every 4 hours when necessary  Hyperthyroidism - continue methimazole  10 mg take 2 tabs = 20 mg by mouth twice a day  UTI - continue Macrobid 100 mg 1  by mouth twice for a total of 7 days   Goals of care:  Long term care    [facility] HospitalMEDINA-VARGAS,MONINA, NP [facility] [phone]

## 2015-07-15 ENCOUNTER — Encounter: Payer: Self-pay | Admitting: Adult Health

## 2015-07-15 ENCOUNTER — Non-Acute Institutional Stay: Payer: Medicare HMO | Admitting: Adult Health

## 2015-07-15 DIAGNOSIS — E43 Unspecified severe protein-calorie malnutrition: Secondary | ICD-10-CM

## 2015-07-15 DIAGNOSIS — E0590 Thyrotoxicosis, unspecified without thyrotoxic crisis or storm: Secondary | ICD-10-CM | POA: Diagnosis not present

## 2015-07-15 DIAGNOSIS — E785 Hyperlipidemia, unspecified: Secondary | ICD-10-CM | POA: Diagnosis not present

## 2015-07-15 DIAGNOSIS — I2782 Chronic pulmonary embolism: Secondary | ICD-10-CM

## 2015-07-15 DIAGNOSIS — G2581 Restless legs syndrome: Secondary | ICD-10-CM

## 2015-07-15 DIAGNOSIS — F32A Depression, unspecified: Secondary | ICD-10-CM

## 2015-07-15 DIAGNOSIS — I5032 Chronic diastolic (congestive) heart failure: Secondary | ICD-10-CM | POA: Diagnosis not present

## 2015-07-15 DIAGNOSIS — E876 Hypokalemia: Secondary | ICD-10-CM

## 2015-07-15 DIAGNOSIS — D509 Iron deficiency anemia, unspecified: Secondary | ICD-10-CM | POA: Diagnosis not present

## 2015-07-15 DIAGNOSIS — Z7901 Long term (current) use of anticoagulants: Secondary | ICD-10-CM

## 2015-07-15 DIAGNOSIS — K219 Gastro-esophageal reflux disease without esophagitis: Secondary | ICD-10-CM

## 2015-07-15 DIAGNOSIS — F329 Major depressive disorder, single episode, unspecified: Secondary | ICD-10-CM | POA: Diagnosis not present

## 2015-07-15 DIAGNOSIS — I10 Essential (primary) hypertension: Secondary | ICD-10-CM

## 2015-07-15 DIAGNOSIS — J309 Allergic rhinitis, unspecified: Secondary | ICD-10-CM

## 2015-07-15 DIAGNOSIS — F411 Generalized anxiety disorder: Secondary | ICD-10-CM

## 2015-07-15 DIAGNOSIS — K529 Noninfective gastroenteritis and colitis, unspecified: Secondary | ICD-10-CM

## 2015-07-15 DIAGNOSIS — F29 Unspecified psychosis not due to a substance or known physiological condition: Secondary | ICD-10-CM

## 2015-07-15 DIAGNOSIS — R143 Flatulence: Secondary | ICD-10-CM

## 2015-07-15 DIAGNOSIS — N3281 Overactive bladder: Secondary | ICD-10-CM

## 2015-07-17 NOTE — Progress Notes (Signed)
Patient ID: Katie Evans, female   DOB: 1946-02-29, 69 y.o.   MRN: [id number]   07/15/15  Facility:  Nursing Home Location:  [facility] Room Number: 1004-1 LEVEL OF CARE:  SNF (31)   Chief Complaint  Patient presents with  . Medical Management of Chronic Issues    Pulmonary embolism, long-term use of anticoagulation, protein calorie malnutrition,  diastolic CHF, hyperthyroidism, anxiety, anemia, allergic rhinitis, hyperlipidemia, GERD,  hypertension, depression,  RLS, flatulence, psychosis and overactive bladder     HISTORY OF PRESENT ILLNESS:  This is a 69 year old female who is being seen for a routine visit. She has been admitted to [facility] on 12/03/14 from [facility]. She was found on the ground for over 8 hours and unable to get back up. CT PA of the chest revealed solitary acute pulmonary embolism in the right upper lobe. BNP was 1734 and troponin 0.76 - elevated. She was treated with Lovenox for PE, and now on Coumadin, and 2 doses of Lasix. Patient has past medical history of hypertension, hypothyroidism, anemia, diastolic CHF and anxiety.   No SOB note. CHF is stable and currently on Methimazole, Demadex and 100 ml fluid restriction. She has recently been started on Devrom to minimize flatulence.   PAST MEDICAL HISTORY:  Past Medical History  Diagnosis Date  . Renal disorder   . CHF (congestive heart failure)   . Arthritis   . Hypertension   . Carpal tunnel syndrome     CURRENT MEDICATIONS: Reviewed per MAR/see medication list    Medication List       This list is accurate as of: 07/15/15 11:59 PM.  Always use your most recent med list.               aspirin 81 MG tablet  Take 81 mg by mouth daily.     atorvastatin 40 MG tablet  Commonly known as:  LIPITOR  Take 40 mg by mouth. Take 1 tablet by mouth every night at bedtime for HLD     Cholecalciferol 1000 UNITS tablet  Take 1,000 Units by mouth daily.     clonazePAM 1 MG tablet  Commonly known as:  KLONOPIN  Take 1 mg by mouth at bedtime.     clonazePAM 0.5 MG tablet  Commonly known as:  KLONOPIN  Take 0.5 mg by mouth 2 (two) times daily.     DECUBI-VITE PO  Take 1 capsule by mouth daily.     DEMADEX 20 MG tablet  Generic drug:  torsemide  Take 20 mg by mouth.     DEVROM PO  Take 200 mg by mouth daily.     famotidine 20 MG tablet  Commonly known as:  PEPCID  Take 20 mg by mouth 2 (two) times daily.     ferrous sulfate 325 (65 FE) MG tablet  Take 325 mg by mouth 2 (two) times daily.     HYDROcodone-acetaminophen 5-325 MG per tablet  Commonly known as:  NORCO/VICODIN  Take 2 tablets by mouth every 4 (four) hours as needed.     IMODIUM A-D 2 MG tablet  Generic drug:  loperamide  Take 2 mg by mouth 4 (four) times daily as needed.     loratadine 10 MG tablet  Commonly known as:  CLARITIN  Take 10 mg by mouth daily.     nadolol 20 MG tablet  Commonly known as:  CORGARD  Take 20 mg by mouth daily.  nitroGLYCERIN 0.4 MG SL tablet  Commonly known as:  NITROSTAT  Place 0.4 mg under the tongue every 5 (five) minutes as needed for chest pain.     nortriptyline 25 MG capsule  Commonly known as:  PAMELOR  Take 25 mg by mouth at bedtime.     oxybutynin 5 MG tablet  Commonly known as:  DITROPAN  Take 0.5 tablets (2.5 mg total) by mouth 3 (three) times daily as needed for bladder spasms.     polycarbophil 625 MG tablet  Commonly known as:  FIBERCON  Take 625 mg by mouth.     potassium chloride 10 MEQ tablet  Commonly known as:  K-DUR,KLOR-CON  Take 10 mEq by mouth 2 (two) times daily.     pramipexole 1 MG tablet  Commonly known as:  MIRAPEX  Take 1 mg by mouth at bedtime.     PROCEL Powd  Take by mouth. Administer 2 scoops by mouth 4 times a day for nutritional support     risperiDONE 0.25 MG tablet  Commonly known as:  RISPERDAL  Take 0.25 mg by mouth at bedtime.     sertraline 100 MG tablet  Commonly known  as:  ZOLOFT  Take 100 mg by mouth daily. Take 1 1/2 tab = 150 mg daily For drepression     warfarin 5 MG tablet  Commonly known as:  COUMADIN  Take 5 mg by mouth daily.         No Known Allergies   REVIEW OF SYSTEMS:  GENERAL: no change in appetite, no fatigue, no weight changes, no fever, chills or weakness RESPIRATORY: no cough, SOB, DOE, wheezing, hemoptysis CARDIAC: no chest pain,  or palpitations GI: no abdominal pain, heart burn  PHYSICAL EXAMINATION  GENERAL: no acute distress EYES: conjunctivae normal, sclerae normal, normal eye lids NECK: supple, trachea midline, no neck masses, no thyroid tenderness, no thyromegaly LYMPHATICS: no LAN in the neck, no supraclavicular LAN RESPIRATORY: breathing is even & unlabored, BS CTAB CARDIAC: RRR, no murmur,no extra heart sounds, BLE edema 3+ GI: abdomen soft, normal BS, no masses, no tenderness, no hepatomegaly, no splenomegaly EXTREMITIES: Able to move all 4 extremities PSYCHIATRIC: the patient is alert & oriented to person, affect & behavior appropriate  LABS/RADIOLOGY: Labs reviewed: 06/24/15  total bilirubin 0.5 direct bilirubin 0.2 indirect bilirubin 0.3 SGOT 31 alkaline phosphatase 257 SGPT 19 and total protein 6.1 albumin 3.3 globulin 2.8 Dever isoenzyme 21 06/21/15  TSH 38.506 05/28/15  urine culture shows >= 100,000 colonies/ML Klebsiella pneumoniae 05/18/15  total T4 2 0.6 TSH <0.008 05/09/15  WBC 8.1 hemoglobin 10.2 hematocrit 32.3 MCV 87.3 platelets 177 sodium 138 potassium 3.6 glucose 91 BUN 27 creatinine 1.09 calcium 9.4 04/29/15  WBC 5.7 hemoglobin 8.2 hematocrit 25.5 MCV 85.6 platelet 98 sodium 140 potassium 3.3 glucose 108 BUN 25 creatinine 1.13 total bilirubin 0.5 alkaline phosphatase 117 SGOT 30 SGPT 22 total protein 4.4 albumin 2.3 calcium 8.9 cholesterol 71 triglycerides 42 HDL 47 LDL 16 TSH <0.008 ferritin 292 04/12/15  sodium 139 potassium 3.5 glucose 104 BUN 26 creatinine 0.76 calcium 9.4 04/07/15  WBC 4.8  hemoglobin 9.1 hematocrit 28.9 MCV 85.3 platelet 95 03/30/15  WBC 5.9 hemoglobin 8.7 hematocrit 27.1 MCV 82.4 platelets 72 03/29/15  sodium 142 potassium 2.9 glucose 90 BUN 24 creatinine 0.80 calcium 9.0  03/21/15  WBC 5.3 hemoglobin 8.9 hematocrit 27.3 MCV 81.5 platelet 94 sodium 139 potassium 2.9 glucose 86 BUN 19 creatinine 0.98 total bilirubin 0.8 alkaline phosphatase 120 SGOT 71 SGPT  38 total protein 4.3 albumin 9.2 02/03/15  WBC 7.5 hemoglobin 9.0 hematocrit 28.4 MCV 95.3 albumin 2.2 01/24/15  sodium 140 potassium 3.7 glucose 86 BUN 23 creatinine 0.89 calcium 9.6 01/21/15  x-ray of right knee shows advanced osteoarthritic changes with loss of joint space. Chronic bone remodeling. No evidence of acute osseous pathology  01/19/15  TSH <0.008 freeT4 4.90  freeT3 17.7 01/13/15  sodium 138 potassium 4.1 glucose 108 BUN 46 creatinine 1.37 calcium 9.7 12/27/14  TSH <0.008  freeT4 5.08  freeT3 19.3 12/07/14  WBC 5.2 hemoglobin 10.2 hematocrit 31.9 MCV 84.4 sodium 139 potassium 3.5 glucose 81 BUN 23 creatinine 0.8 calcium 9.7 total protein 4.6 albumin 2.6 ALP 72 AST 16 GFR>60 freeT4 0.9 12/02/14  WBC 4.9 hemoglobin 10.1 hematocrit 32.7 12/01/14  sodium 142 potassium 3.7 BUN 20 creatinine 0.70 11/28/14  cholesterol 85 LDL 37 HDL 33 triglycerides 74  ASSESSMENT/PLAN:  Pulmonary embolism - continue Coumadin  Long-term use of anticoagulation - INR 2.5, therapeutic.. INR goal 2-3; continue Coumadin to 5. mg PO Q D and check INR on 07/19/15  Chronic diastolic CHF - continue Demadex 20 mg 1 tab by mouth daily and ; weigh Q Mondays and Wednesdays; continue 1800 ml fluid restriction  Hypertension - well controlled; continue Nadolol 20 mg by mouth daily  Hyperlipidemia - continue Lipitor 40 mg by mouth daily at bedtime  Anxiety - mood is is stable; continue Klonopin 1 mg by mouth daily and Klonopin 0.5  Mg 1 tab PO BID  Depression - continue Zoloft 150 mg by mouth daily and Pamelor 25 mg PO Q HS  Chronic  diarrhea -hx of gastric bypass surgery; continue Imodium 2 mg by mouth 4 times a day when necessary  Hypokalemia - K3.6; continue KCl 20 MEQ  ER  by mouth twice a day  Anemia, iron deficiency - hgb 10.2 ; continue ferrous sulfate 325 mg by mouth BID   Protein cholerae malnutrition, severe - 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

## 2015-09-20 ENCOUNTER — Other Ambulatory Visit: Payer: Self-pay | Admitting: *Deleted

## 2015-09-20 MED ORDER — DIPHENOXYLATE-ATROPINE 2.5-0.025 MG PO TABS: ORAL_TABLET | ORAL | Status: DC

## 2015-09-20 NOTE — Telephone Encounter (Signed)
[facility] 

## 2015-10-08 LAB — HEPATIC FUNCTION PANEL
ALT: 11 U/L (ref 7–35)
AST: 16 U/L (ref 13–35)
Alkaline Phosphatase: 72 U/L (ref 25–125)
Bilirubin, Total: 0.4 mg/dL

## 2015-10-08 LAB — CBC AND DIFFERENTIAL
HCT: 32 % — AB (ref 36–46)
HEMOGLOBIN: 10.2 g/dL — AB (ref 12.0–16.0)
PLATELETS: 101 10*3/uL — AB (ref 150–399)
WBC: 5.2 10*3/mL

## 2015-10-08 LAB — BASIC METABOLIC PANEL
BUN: 23 mg/dL — AB (ref 4–21)
Creatinine: 1 mg/dL (ref 0.5–1.1)
Glucose: 81 mg/dL
POTASSIUM: 3.5 mmol/L (ref 3.4–5.3)
Sodium: 144 mmol/L (ref 137–147)

## 2015-10-08 LAB — TSH: TSH: 0.01 u[IU]/mL — AB (ref 0.41–5.90)

## 2015-10-12 LAB — HEPATIC FUNCTION PANEL
ALT: 23 U/L (ref 7–35)
AST: 25 U/L (ref 13–35)
Alkaline Phosphatase: 212 U/L — AB (ref 25–125)
Bilirubin, Total: 0.5 mg/dL

## 2015-10-12 LAB — BASIC METABOLIC PANEL
BUN: 29 mg/dL — AB (ref 4–21)
CREATININE: 1 mg/dL (ref 0.5–1.1)
Potassium: 4.2 mmol/L (ref 3.4–5.3)
Sodium: 138 mmol/L (ref 137–147)

## 2015-10-12 LAB — CBC AND DIFFERENTIAL
HEMATOCRIT: 35 % — AB (ref 36–46)
Hemoglobin: 10.4 g/dL — AB (ref 12.0–16.0)
PLATELETS: 104 10*3/uL — AB (ref 150–399)
WBC: 5.5 10^3/mL

## 2015-10-17 ENCOUNTER — Non-Acute Institutional Stay (SKILLED_NURSING_FACILITY): Payer: Medicare HMO | Admitting: Adult Health

## 2015-10-17 ENCOUNTER — Encounter: Payer: Self-pay | Admitting: Adult Health

## 2015-10-17 DIAGNOSIS — N3281 Overactive bladder: Secondary | ICD-10-CM

## 2015-10-17 DIAGNOSIS — D509 Iron deficiency anemia, unspecified: Secondary | ICD-10-CM | POA: Diagnosis not present

## 2015-10-17 DIAGNOSIS — I10 Essential (primary) hypertension: Secondary | ICD-10-CM | POA: Diagnosis not present

## 2015-10-17 DIAGNOSIS — E785 Hyperlipidemia, unspecified: Secondary | ICD-10-CM

## 2015-10-17 DIAGNOSIS — E43 Unspecified severe protein-calorie malnutrition: Secondary | ICD-10-CM

## 2015-10-17 DIAGNOSIS — I5032 Chronic diastolic (congestive) heart failure: Secondary | ICD-10-CM | POA: Diagnosis not present

## 2015-10-17 DIAGNOSIS — E876 Hypokalemia: Secondary | ICD-10-CM

## 2015-10-17 DIAGNOSIS — R143 Flatulence: Secondary | ICD-10-CM

## 2015-10-17 DIAGNOSIS — I2782 Chronic pulmonary embolism: Secondary | ICD-10-CM | POA: Diagnosis not present

## 2015-10-17 DIAGNOSIS — K219 Gastro-esophageal reflux disease without esophagitis: Secondary | ICD-10-CM

## 2015-10-17 DIAGNOSIS — F32A Depression, unspecified: Secondary | ICD-10-CM

## 2015-10-17 DIAGNOSIS — F29 Unspecified psychosis not due to a substance or known physiological condition: Secondary | ICD-10-CM

## 2015-10-17 DIAGNOSIS — M1990 Unspecified osteoarthritis, unspecified site: Secondary | ICD-10-CM

## 2015-10-17 DIAGNOSIS — F411 Generalized anxiety disorder: Secondary | ICD-10-CM

## 2015-10-17 DIAGNOSIS — E0590 Thyrotoxicosis, unspecified without thyrotoxic crisis or storm: Secondary | ICD-10-CM | POA: Diagnosis not present

## 2015-10-17 DIAGNOSIS — F329 Major depressive disorder, single episode, unspecified: Secondary | ICD-10-CM

## 2015-10-17 DIAGNOSIS — K529 Noninfective gastroenteritis and colitis, unspecified: Secondary | ICD-10-CM

## 2015-10-17 DIAGNOSIS — G2581 Restless legs syndrome: Secondary | ICD-10-CM

## 2015-10-17 LAB — TSH: TSH: 0.02 u[IU]/mL — AB (ref 0.41–5.90)

## 2015-11-09 ENCOUNTER — Other Ambulatory Visit: Payer: Self-pay | Admitting: *Deleted

## 2015-11-09 DIAGNOSIS — F411 Generalized anxiety disorder: Secondary | ICD-10-CM

## 2015-11-09 MED ORDER — CLONAZEPAM 0.5 MG PO TABS: ORAL_TABLET | ORAL | Status: DC

## 2015-12-09 NOTE — Addendum Note (Signed)
Addended by: Lodema HongSIMPSON, MESHELL A on: 12/09/2015 03:09 PM   Modules accepted: Medications

## 2015-12-21 LAB — BASIC METABOLIC PANEL
BUN: 31 mg/dL — AB (ref 4–21)
Creatinine: 1.2 mg/dL — AB (ref 0.5–1.1)
GLUCOSE: 90 mg/dL
Potassium: 4.2 mmol/L (ref 3.4–5.3)
Sodium: 141 mmol/L (ref 137–147)

## 2015-12-22 ENCOUNTER — Encounter: Payer: Self-pay | Admitting: Adult Health

## 2015-12-22 ENCOUNTER — Non-Acute Institutional Stay (SKILLED_NURSING_FACILITY): Payer: Medicare Other | Admitting: Adult Health

## 2015-12-22 DIAGNOSIS — R143 Flatulence: Secondary | ICD-10-CM

## 2015-12-22 DIAGNOSIS — N3281 Overactive bladder: Secondary | ICD-10-CM

## 2015-12-22 DIAGNOSIS — E43 Unspecified severe protein-calorie malnutrition: Secondary | ICD-10-CM

## 2015-12-22 DIAGNOSIS — K219 Gastro-esophageal reflux disease without esophagitis: Secondary | ICD-10-CM | POA: Diagnosis not present

## 2015-12-22 DIAGNOSIS — G2581 Restless legs syndrome: Secondary | ICD-10-CM

## 2015-12-22 DIAGNOSIS — E785 Hyperlipidemia, unspecified: Secondary | ICD-10-CM

## 2015-12-22 DIAGNOSIS — M1990 Unspecified osteoarthritis, unspecified site: Secondary | ICD-10-CM

## 2015-12-22 DIAGNOSIS — I10 Essential (primary) hypertension: Secondary | ICD-10-CM

## 2015-12-22 DIAGNOSIS — I5032 Chronic diastolic (congestive) heart failure: Secondary | ICD-10-CM

## 2015-12-22 DIAGNOSIS — K529 Noninfective gastroenteritis and colitis, unspecified: Secondary | ICD-10-CM

## 2015-12-22 DIAGNOSIS — E0590 Thyrotoxicosis, unspecified without thyrotoxic crisis or storm: Secondary | ICD-10-CM | POA: Diagnosis not present

## 2015-12-22 DIAGNOSIS — E876 Hypokalemia: Secondary | ICD-10-CM | POA: Diagnosis not present

## 2015-12-22 DIAGNOSIS — F329 Major depressive disorder, single episode, unspecified: Secondary | ICD-10-CM | POA: Diagnosis not present

## 2015-12-22 DIAGNOSIS — D509 Iron deficiency anemia, unspecified: Secondary | ICD-10-CM

## 2015-12-22 DIAGNOSIS — I2782 Chronic pulmonary embolism: Secondary | ICD-10-CM

## 2015-12-22 DIAGNOSIS — R1110 Vomiting, unspecified: Secondary | ICD-10-CM

## 2015-12-22 DIAGNOSIS — F29 Unspecified psychosis not due to a substance or known physiological condition: Secondary | ICD-10-CM

## 2015-12-22 DIAGNOSIS — F32A Depression, unspecified: Secondary | ICD-10-CM

## 2015-12-22 NOTE — Progress Notes (Signed)
Patient ID: Katie Evans, female   DOB: 11-11-1946, 70 y.o.   MRN: [id number]   DATE:   12/22/15  Facility:  Nursing Home Location:  [facility] Room Number: 1005-2 LEVEL OF CARE:  SNF (31)   Chief Complaint  Patient presents with  . Medical Management of Chronic Issues    Pulmonary embolism, osteoarthritis, protein calorie malnutrition,  diastolic CHF, hyperthyroidism, anxiety, anemia, allergic rhinitis, hyperlipidemia, GERD,  hypertension, depression,  RLS, flatulence, psychosis and overactive bladder     HISTORY OF PRESENT ILLNESS:  This is a 70 year old female who is being seen for a routine visit. Her Mirapex was recently increased to 1.5 mg BID for her RLS. Tapazole was increased to 5 mg BID with her  tsh <0.015t, free T3 4.69 and free T4 1.63.Marland Kitchen Her mood is stable and Klonopin was decreased to 1 mg Q HS   PAST MEDICAL HISTORY:  Past Medical History  Diagnosis Date  . Renal disorder   . CHF (congestive heart failure) (HCC)   . Arthritis   . Hypertension   . Carpal tunnel syndrome   . Metabolic encephalopathy   . Anemia   . Major depressive disorder (HCC)   . Thrombocytopathia (HCC)   . Demand ischemia of myocardium (HCC)   . Chronic pulmonary embolism (HCC)   . Protein calorie malnutrition (HCC)   . Allergic rhinitis   . Hyperlipidemia   . Hyperthyroidism   . Generalized anxiety disorder   . GERD without esophagitis   . Hypokalemia   . Overactive bladder   . Psychosis   . RLS (restless legs syndrome)   . Chronic diarrhea   . Flatulence     CURRENT MEDICATIONS: Reviewed per MAR/see medication list   Medication List       This list is accurate as of: 12/22/15 12:31 PM.  Always use your most recent med list.               ARTIFICIAL TEARS OP  Apply to eye 3 (three) times daily as needed.     aspirin 81 MG chewable tablet  Chew by mouth daily.     Cholecalciferol 1000 units tablet  Take 1,000 Units by mouth daily.      clonazePAM 1 MG tablet  Commonly known as:  KLONOPIN  Take 1 mg by mouth at bedtime.     DECUBI-VITE PO  Take 1 capsule by mouth daily.     DEVROM 200 MG Chew  Generic drug:  Bismuth Subgallate  Chew 1 tablet by mouth.     diphenoxylate-atropine 2.5-0.025 MG tablet  Commonly known as:  LOMOTIL  Take by mouth. May take up to 6 per day as needed     famotidine 20 MG tablet  Commonly known as:  PEPCID  Take 20 mg by mouth daily.     FEROSUL PO  Take 325 mg by mouth 2 (two) times daily.     HYDROcodone-acetaminophen 5-325 MG tablet  Commonly known as:  NORCO/VICODIN  Take 1 tablet by mouth every 4 (four) hours as needed for moderate pain.     hydrocortisone 25 MG suppository  Commonly known as:  ANUSOL-HC  Place 25 mg rectally 2 (two) times daily.     LIPITOR 40 MG tablet  Generic drug:  atorvastatin  Take 40 mg by mouth at bedtime.     loperamide 2 MG capsule  Commonly known as:  IMODIUM  Take by mouth as needed for diarrhea or loose  stools. Take 1 capsule by mouth four times daily as needed for diarrhea     loratadine 10 MG tablet  Commonly known as:  CLARITIN  Take 10 mg by mouth daily.     meloxicam 7.5 MG tablet  Commonly known as:  MOBIC  Take 7.5 mg by mouth daily.     methimazole 5 MG tablet  Commonly known as:  TAPAZOLE  Take 5 mg by mouth 2 (two) times daily.     metoCLOPramide 5 MG tablet  Commonly known as:  REGLAN  Take 5 mg by mouth 3 (three) times daily.     miconazole 2 % cream  Commonly known as:  MICOTIN  Apply 1 application topically as needed.     nadolol 20 MG tablet  Commonly known as:  CORGARD  Take 20 mg by mouth daily.     nitroGLYCERIN 0.4 MG SL tablet  Commonly known as:  NITROSTAT  Place 0.4 mg under the tongue every 5 (five) minutes as needed for chest pain. For 3 doses as needed for chest pain     nortriptyline 25 MG capsule  Commonly known as:  PAMELOR  Take 25 mg by mouth at bedtime.     oxybutynin 5 MG tablet   Commonly known as:  DITROPAN  Take 5 mg by mouth 3 (three) times daily as needed for bladder spasms.     polycarbophil 625 MG tablet  Commonly known as:  FIBERCON  Take 625 mg by mouth daily.     potassium chloride SA 20 MEQ tablet  Commonly known as:  K-DUR,KLOR-CON  Take 20 mEq by mouth 2 (two) times daily. Take with food.  Do not crush.  May dissolve.     pramipexole 1.5 MG tablet  Commonly known as:  MIRAPEX  Take 1.5 mg by mouth 2 (two) times daily.     risperiDONE 0.25 MG tablet  Commonly known as:  RISPERDAL  Take 0.25 mg by mouth at bedtime.     sertraline 100 MG tablet  Commonly known as:  ZOLOFT  Take 100 mg by mouth daily. Give 1 tablet with a 50 MG and a 25 MG tablet to = 175 mg po daily     sertraline 50 MG tablet  Commonly known as:  ZOLOFT  Take 50 mg by mouth daily. Give 1 tablet with a 100 MG and a 25 MG tablet to = 175 MG po daily     sertraline 25 MG tablet  Commonly known as:  ZOLOFT  Take 25 mg by mouth daily. Give 1 tablet with a 50 MG and a 100 MG tablet to = 175 mg PO daily     torsemide 20 MG tablet  Commonly known as:  DEMADEX  Take 20 mg by mouth daily.     warfarin 4 MG tablet  Commonly known as:  COUMADIN  Take 4 mg by mouth daily.           Allergies  Allergen Reactions  . Remeron [Mirtazapine]      REVIEW OF SYSTEMS:  GENERAL: no change in appetite, no fatigue, no weight changes, no fever, chills or weakness RESPIRATORY: no cough, SOB, DOE, wheezing, hemoptysis CARDIAC: no chest pain,  or palpitations GI: no abdominal pain, heart burn  PHYSICAL EXAMINATION  GENERAL: no acute distress EYES: conjunctivae normal, sclerae normal, normal eye lids NECK: supple, trachea midline, no neck masses, no thyroid tenderness, no thyromegaly LYMPHATICS: no LAN in the neck, no supraclavicular LAN RESPIRATORY: breathing is  even & unlabored, BS CTAB CARDIAC: RRR, no murmur,no extra heart sounds, BLE edema 3+ GI: abdomen soft, normal BS,  no masses, no tenderness, no hepatomegaly, no splenomegaly EXTREMITIES: Able to move all 4 extremities; uses wheelchair to move around PSYCHIATRIC: the patient is alert & oriented to person, affect & behavior appropriate  LABS/RADIOLOGY: Labs reviewed:  Basic Metabolic Panel:  Recent Labs  24/40/10 1029 10/08/15 10/12/15  NA 135 144 138  K 3.7 3.5 4.2  CL 102  --   --   CO2 27  --   --   GLUCOSE 103*  --   --   BUN 26* 23* 29*  CREATININE 1.03* 1.0 1.0  CALCIUM 9.4  --   --    Liver Function Tests:  Recent Labs  10/08/15 10/12/15  AST 16 25  ALT 11 23  ALKPHOS 72 212*   CBC:  Recent Labs  05/10/15 1029 10/08/15 10/12/15  WBC 7.2 5.2 5.5  NEUTROABS 3.9  --   --   HGB 9.7* 10.2* 10.4*  HCT 30.5* 32* 35*  MCV 87.1  --   --   PLT 157 101* 104*     06/24/15  total bilirubin 0.5 direct bilirubin 0.2 indirect bilirubin 0.3 SGOT 31 alkaline phosphatase 257 SGPT 19 and total protein 6.1 albumin 3.3 globulin 2.8 Dever isoenzyme 21 06/21/15  TSH 38.506 05/28/15  urine culture shows >= 100,000 colonies/ML Klebsiella pneumoniae 05/18/15  total T4 2 0.6 TSH <0.008 05/09/15  WBC 8.1 hemoglobin 10.2 hematocrit 32.3 MCV 87.3 platelets 177 sodium 138 potassium 3.6 glucose 91 BUN 27 creatinine 1.09 calcium 9.4 04/29/15  WBC 5.7 hemoglobin 8.2 hematocrit 25.5 MCV 85.6 platelet 98 sodium 140 potassium 3.3 glucose 108 BUN 25 creatinine 1.13 total bilirubin 0.5 alkaline phosphatase 117 SGOT 30 SGPT 22 total protein 4.4 albumin 2.3 calcium 8.9 cholesterol 71 triglycerides 42 HDL 47 LDL 16 TSH <0.008 ferritin 292 04/12/15  sodium 139 potassium 3.5 glucose 104 BUN 26 creatinine 0.76 calcium 9.4 04/07/15  WBC 4.8 hemoglobin 9.1 hematocrit 28.9 MCV 85.3 platelet 95 03/30/15  WBC 5.9 hemoglobin 8.7 hematocrit 27.1 MCV 82.4 platelets 72 03/29/15  sodium 142 potassium 2.9 glucose 90 BUN 24 creatinine 0.80 calcium 9.0  03/21/15  WBC 5.3 hemoglobin 8.9 hematocrit 27.3 MCV 81.5 platelet 94 sodium 139  potassium 2.9 glucose 86 BUN 19 creatinine 0.98 total bilirubin 0.8 alkaline phosphatase 120 SGOT 71 SGPT 38 total protein 4.3 albumin 9.2 02/03/15  WBC 7.5 hemoglobin 9.0 hematocrit 28.4 MCV 95.3 albumin 2.2 01/24/15  sodium 140 potassium 3.7 glucose 86 BUN 23 creatinine 0.89 calcium 9.6 01/21/15  x-ray of right knee shows advanced osteoarthritic changes with loss of joint space. Chronic bone remodeling. No evidence of acute osseous pathology  01/19/15  TSH <0.008 freeT4 4.90  freeT3 17.7 01/13/15  sodium 138 potassium 4.1 glucose 108 BUN 46 creatinine 1.37 calcium 9.7 12/27/14  TSH <0.008  freeT4 5.08  freeT3 19.3 12/07/14  WBC 5.2 hemoglobin 10.2 hematocrit 31.9 MCV 84.4 sodium 139 potassium 3.5 glucose 81 BUN 23 creatinine 0.8 calcium 9.7 total protein 4.6 albumin 2.6 ALP 72 AST 16 GFR>60 freeT4 0.9 12/02/14  WBC 4.9 hemoglobin 10.1 hematocrit 32.7 12/01/14  sodium 142 potassium 3.7 BUN 20 creatinine 0.70 11/28/14  cholesterol 85 LDL 37 HDL 33 triglycerides 74  ASSESSMENT/PLAN:  Pulmonary embolism - continue Coumadin  Chronic diastolic CHF - continue Demadex 20 mg 1 tab by mouth daily and ; weigh Q Mondays and Wednesdays; continue 1800 ml fluid restriction  Hypertension - well controlled; continue Nadolol 20 mg by mouth daily  Hyperlipidemia - continue Lipitor 40 mg by mouth daily at bedtime  Anxiety - mood is is stable; continue Klonopin 1 mg by mouth daily   Depression - continue Zoloft 175 mg by mouth daily and Pamelor 25 mg PO Q HS  Chronic diarrhea -hx of gastric bypass surgery; continue Imodium 2 mg by mouth 4 times a day when necessary  Hypokalemia - K4.2 ; continue KCl 20 MEQ  ER  by mouth twice a day  Anemia, iron deficiency - hgb 10.4 ; continue ferrous sulfate 325 mg by mouth BID   Protein cholerae malnutrition, severe - albumin 2.2; continue supplementation  Overactive Bladder - continue Oxybutynin 5 mg 1 tab PO TID PRN  GERD - continue Pepcid 20 mg 1 tab PO Q  D  Hyperthyroidism - tsh 38.506; continue Methimazole 10 mg 1/2 tab = 5 mg daily  RLS - continue Mirapex 1.5 mg BID  Psychosis - continue Risperidone 0.25 mg Q HS  Flatulence - continue Devrom 200 mg daily  Osteoarthritis - continue Voltaren gel 1% 2 g topically to bilateral shoulders and bilateral knees twice a day when necessary and recently started on Mobic 7.5 mg by mouth daily      Goals of care:  Short-term rehabilitation   [facility], NP [facility] [phone]

## 2015-12-22 NOTE — Progress Notes (Addendum)
Patient ID: Katie Evans, female   DOB: Jun 16, 1946, 70 y.o.   MRN: [id number]    DATE:     10/17/15  Facility:  Nursing Home Location:  [facility] Room Number: 1004-1 LEVEL OF CARE:  SNF (31)   Chief Complaint  Patient presents with  . Medical Management of Chronic Issues    Pulmonary embolism, protein calorie malnutrition,  diastolic CHF, hyperthyroidism, anxiety, anemia, allergic rhinitis, hyperlipidemia, GERD,  hypertension, depression,  RLS, flatulence, psychosis and overactive bladder     HISTORY OF PRESENT ILLNESS:  This is a 70 year old female who is being seen for a routine visit.  She  recently completed antibiotic treatment for UTI. Her mood is stable and Zoloft was recently increased to 175 mg daily. Patient complained of legs being restless @ night and Mirapex dosage was increased to 1.5 mg Q HS. Klonopin dosage was decreased to 0.25 mg BID. She is a long term care resident of [address].   PAST MEDICAL HISTORY:  Past Medical History  Diagnosis Date  . Renal disorder   . CHF (congestive heart failure) (HCC)   . Arthritis   . Hypertension   . Carpal tunnel syndrome   . Metabolic encephalopathy   . Anemia   . Major depressive disorder (HCC)   . Thrombocytopathia (HCC)   . Demand ischemia of myocardium (HCC)   . Chronic pulmonary embolism (HCC)   . Protein calorie malnutrition (HCC)   . Allergic rhinitis   . Hyperlipidemia   . Hyperthyroidism   . Generalized anxiety disorder   . GERD without esophagitis   . Hypokalemia   . Overactive bladder   . Psychosis   . RLS (restless legs syndrome)   . Chronic diarrhea   . Flatulence     CURRENT MEDICATIONS: Reviewed per MAR/see medication list    Medication List       This list is accurate as of: 10/17/15 11:59 PM.  Always use your most recent med list.               aspirin 81 MG tablet  Take 81 mg by mouth daily.     atorvastatin 40 MG tablet  Commonly known as:   LIPITOR  Take 40 mg by mouth. Take 1 tablet by mouth every night at bedtime for HLD     Cholecalciferol 1000 units tablet  Take 1,000 Units by mouth daily.     clonazePAM 1 MG tablet  Commonly known as:  KLONOPIN  Take 1 mg by mouth at bedtime.     clonazePAM 0.5 MG tablet  Commonly known as:  KLONOPIN  Take 0.5 mg by mouth 2 (two) times daily.     DECUBI-VITE PO  Take 1 capsule by mouth daily.     DEMADEX 20 MG tablet  Generic drug:  torsemide  Take 20 mg by mouth.     DEVROM PO  Take 200 mg by mouth daily.     diphenoxylate-atropine 2.5-0.025 MG tablet  Commonly known as:  LOMOTIL  Take one tablet by mouth three times daily as needed. Up to 6 daily     famotidine 20 MG tablet  Commonly known as:  PEPCID  Take 20 mg by mouth 2 (two) times daily. Reported on 12/08/2015     ferrous sulfate 325 (65 FE) MG tablet  Take 325 mg by mouth 2 (two) times daily. Reported on 12/08/2015     HYDROcodone-acetaminophen 5-325 MG tablet  Commonly known as:  NORCO/VICODIN  Take 2 tablets by mouth every 4 (four) hours as needed.     IMODIUM A-D 2 MG tablet  Generic drug:  loperamide  Take 2 mg by mouth 4 (four) times daily as needed.     loratadine 10 MG tablet  Commonly known as:  CLARITIN  Take 10 mg by mouth daily.     nadolol 20 MG tablet  Commonly known as:  CORGARD  Take 20 mg by mouth daily.     nitroGLYCERIN 0.4 MG SL tablet  Commonly known as:  NITROSTAT  Place 0.4 mg under the tongue every 5 (five) minutes as needed for chest pain. For 3 doses as needed for chest pain     nortriptyline 25 MG capsule  Commonly known as:  PAMELOR  Take 25 mg by mouth at bedtime.     oxybutynin 5 MG tablet  Commonly known as:  DITROPAN  Take 0.5 tablets (2.5 mg total) by mouth 3 (three) times daily as needed for bladder spasms.     polycarbophil 625 MG tablet  Commonly known as:  FIBERCON  Take 625 mg by mouth.     potassium chloride 10 MEQ tablet  Commonly known as:   K-DUR,KLOR-CON  Take 10 mEq by mouth 2 (two) times daily.     pramipexole 1 MG tablet  Commonly known as:  MIRAPEX  Take 1 mg by mouth at bedtime.     PROCEL Powd  Take by mouth. Administer 2 scoops by mouth 4 times a day for nutritional support     risperiDONE 0.25 MG tablet  Commonly known as:  RISPERDAL  Take 0.25 mg by mouth at bedtime.     sertraline 100 MG tablet  Commonly known as:  ZOLOFT  Take 100 mg by mouth daily. Take 1 1/2 tab = 150 mg daily For drepression     warfarin 5 MG tablet  Commonly known as:  COUMADIN  Take 5 mg by mouth daily.         Allergies  Allergen Reactions  . Remeron [Mirtazapine]      REVIEW OF SYSTEMS:  GENERAL: no change in appetite, no fatigue, no weight changes, no fever, chills or weakness RESPIRATORY: no cough, SOB, DOE, wheezing, hemoptysis CARDIAC: no chest pain,  or palpitations GI: no abdominal pain, heart burn  PHYSICAL EXAMINATION  GENERAL: no acute distress EYES: conjunctivae normal, sclerae normal, normal eye lids NECK: supple, trachea midline, no neck masses, no thyroid tenderness, no thyromegaly LYMPHATICS: no LAN in the neck, no supraclavicular LAN RESPIRATORY: breathing is even & unlabored, BS CTAB CARDIAC: RRR, no murmur,no extra heart sounds, BLE edema 3+ GI: abdomen soft, normal BS, no masses, no tenderness, no hepatomegaly, no splenomegaly EXTREMITIES: Able to move all 4 extremities PSYCHIATRIC: the patient is alert & oriented to person, affect & behavior appropriate  LABS/RADIOLOGY:  Labs reviewed: 10/17/15   <0.015  free T3 4.69   Free T4 1.63 10/12/15  TSH  <0.015 WBC 5.5 hemoglobin 10.4 hematocrit 34.6 MCV 92.3 platelet 104 sodium 138 potassium 4.2 glucose 92 BUN 29 creatinine 1.00 calcium 9.2 total protein 5.5 albumin 3.44 globulin 2.1 total bilirubin 0.46 alkaline phosphatase 212 SGOT 25 SGPT 23 06/24/15  total bilirubin 0.5 direct bilirubin 0.2 indirect bilirubin 0.3 SGOT 31 alkaline phosphatase 257  SGPT 19 and total protein 6.1 albumin 3.3 globulin 2.8 Dever isoenzyme 21 06/21/15  TSH 38.506 05/28/15  urine culture shows >= 100,000 colonies/ML Klebsiella pneumoniae 05/18/15  total T4 2 0.6 TSH <0.008  05/09/15  WBC 8.1 hemoglobin 10.2 hematocrit 32.3 MCV 87.3 platelets 177 sodium 138 potassium 3.6 glucose 91 BUN 27 creatinine 1.09 calcium 9.4 04/29/15  WBC 5.7 hemoglobin 8.2 hematocrit 25.5 MCV 85.6 platelet 98 sodium 140 potassium 3.3 glucose 108 BUN 25 creatinine 1.13 total bilirubin 0.5 alkaline phosphatase 117 SGOT 30 SGPT 22 total protein 4.4 albumin 2.3 calcium 8.9 cholesterol 71 triglycerides 42 HDL 47 LDL 16 TSH <0.008 ferritin 292 04/12/15  sodium 139 potassium 3.5 glucose 104 BUN 26 creatinine 0.76 calcium 9.4 04/07/15  WBC 4.8 hemoglobin 9.1 hematocrit 28.9 MCV 85.3 platelet 95 03/30/15  WBC 5.9 hemoglobin 8.7 hematocrit 27.1 MCV 82.4 platelets 72 03/29/15  sodium 142 potassium 2.9 glucose 90 BUN 24 creatinine 0.80 calcium 9.0  03/21/15  WBC 5.3 hemoglobin 8.9 hematocrit 27.3 MCV 81.5 platelet 94 sodium 139 potassium 2.9 glucose 86 BUN 19 creatinine 0.98 total bilirubin 0.8 alkaline phosphatase 120 SGOT 71 SGPT 38 total protein 4.3 albumin 9.2 02/03/15  WBC 7.5 hemoglobin 9.0 hematocrit 28.4 MCV 95.3 albumin 2.2 01/24/15  sodium 140 potassium 3.7 glucose 86 BUN 23 creatinine 0.89 calcium 9.6 01/21/15  x-ray of right knee shows advanced osteoarthritic changes with loss of joint space. Chronic bone remodeling. No evidence of acute osseous pathology  01/19/15  TSH <0.008 freeT4 4.90  freeT3 17.7 01/13/15  sodium 138 potassium 4.1 glucose 108 BUN 46 creatinine 1.37 calcium 9.7 12/27/14  TSH <0.008  freeT4 5.08  freeT3 19.3 12/07/14  WBC 5.2 hemoglobin 10.2 hematocrit 31.9 MCV 84.4 sodium 139 potassium 3.5 glucose 81 BUN 23 creatinine 0.8 calcium 9.7 total protein 4.6 albumin 2.6 ALP 72 AST 16 GFR>60 freeT4 0.9 12/02/14  WBC 4.9 hemoglobin 10.1 hematocrit 32.7 12/01/14  sodium 142 potassium 3.7 BUN 20  creatinine 0.70 11/28/14  cholesterol 85 LDL 37 HDL 33 triglycerides 74  ASSESSMENT/PLAN:  Pulmonary embolism - continue Coumadin  Chronic diastolic CHF - continue Demadex 20 mg 1 tab by mouth daily and ; weigh Q Mondays and Wednesdays; continue 1800 ml fluid restriction  Hypertension - well controlled; continue Nadolol 20 mg by mouth daily  Hyperlipidemia - continue Lipitor 40 mg by mouth daily at bedtime  Anxiety - mood is is stable; continue Klonopin 1 mg by mouth daily and Klonopin 0.5  Mg 1 tab PO BID  Depression - continue Zoloft 175 mg by mouth daily and Pamelor 25 mg PO Q HS  Chronic diarrhea -hx of gastric bypass surgery; continue Imodium 2 mg by mouth 4 times a day when necessary  Hypokalemia - K4.2 ; continue KCl 20 MEQ  ER  by mouth twice a day  Anemia, iron deficiency - hgb 10.4 ; continue ferrous sulfate 325 mg by mouth BID   Protein cholerae malnutrition, severe - albumin 2.2; continue supplementation  Overactive Bladder - continue Oxybutynin 5 mg 1 tab PO TID PRN  GERD - continue Pepcid 20 mg 1 tab PO Q D  Hyperthyroidism - tsh 38.506; continue Methimazole 10 mg 1/2 tab = 5 mg daily  RLS - continue Mirapex 1.5 mg PO Q HS  Psychosis - continue Risperidone 0.25 mg Q HS  Flatulence - continue Devrom 200 mg daily  Osteoarthritis - start Voltaren 1% gel 2 g topically to bilateral shoulders and bilateral knees twice a day when necessary     Goals of care:  Short-term rehabilitation   [facility], NP [facility] [phone]

## 2015-12-27 ENCOUNTER — Non-Acute Institutional Stay (SKILLED_NURSING_FACILITY): Payer: Medicare Other | Admitting: Adult Health

## 2015-12-27 ENCOUNTER — Encounter: Payer: Self-pay | Admitting: Adult Health

## 2015-12-27 DIAGNOSIS — Z7901 Long term (current) use of anticoagulants: Secondary | ICD-10-CM | POA: Diagnosis not present

## 2015-12-27 DIAGNOSIS — I2782 Chronic pulmonary embolism: Secondary | ICD-10-CM | POA: Diagnosis not present

## 2015-12-27 NOTE — Progress Notes (Signed)
Patient ID: Katie Evans, female   DOB: [birth year], 70 y.o.   MRN: [id number] Subjective:     Indication: PE Bleeding signs/symptoms: None Thromboembolic signs/symptoms: None  Missed Coumadin doses: None Medication changes: no Dietary changes: no Bacterial/viral infection: no Other concerns: no   Review of Systems A comprehensive review of systems was negative.   Objective:    INR Today: 1.7 Current dose:   4 mg  Assessment:    Subtherapeutic INR for goal of 2-3   Plan:    1. New dose: Increase Coumadin to 4.5 mg daily   2. Next INR:  12/30/15

## 2016-01-09 LAB — BASIC METABOLIC PANEL
BUN: 35 mg/dL — AB (ref 4–21)
CREATININE: 1.2 mg/dL — AB (ref 0.5–1.1)
GLUCOSE: 113 mg/dL
POTASSIUM: 3.5 mmol/L (ref 3.4–5.3)
SODIUM: 141 mmol/L (ref 137–147)

## 2016-01-11 ENCOUNTER — Encounter: Payer: Self-pay | Admitting: Adult Health

## 2016-01-11 ENCOUNTER — Non-Acute Institutional Stay (SKILLED_NURSING_FACILITY): Payer: Medicare Other | Admitting: Adult Health

## 2016-01-11 DIAGNOSIS — I2782 Chronic pulmonary embolism: Secondary | ICD-10-CM

## 2016-01-11 DIAGNOSIS — Z7901 Long term (current) use of anticoagulants: Secondary | ICD-10-CM

## 2016-01-11 NOTE — Progress Notes (Signed)
Patient ID: Katie Evans, female   DOB: 1945-12-28, 70 y.o.   MRN: [id number] Subjective:     Indication: PE Bleeding signs/symptoms: None Thromboembolic signs/symptoms: None  Missed Coumadin doses: held Coumadin X 1 day due to subtherapeutic INR Medication changes: no Dietary changes: no Bacterial/viral infection: no Other concerns: no  The following portions of the patient's history were reviewed and updated as appropriate: allergies, current medications, past family history, past medical history, past social history, past surgical history and problem list.  Review of Systems A comprehensive review of systems was negative.   Objective:    INR Today: 1.7 Current dose: Coumadin 10 mg X 2 days     Assessment:    Subtherapeutic INR for goal of 2-3   Plan:    1. New dose: Coumadin 5 mg daily   2. Next INR: 01/13/16

## 2016-01-12 LAB — TSH: TSH: 0.02 u[IU]/mL — AB (ref 0.41–5.90)

## 2016-01-13 ENCOUNTER — Encounter: Payer: Self-pay | Admitting: Adult Health

## 2016-01-13 ENCOUNTER — Non-Acute Institutional Stay (SKILLED_NURSING_FACILITY): Payer: Medicare Other | Admitting: Adult Health

## 2016-01-13 DIAGNOSIS — E0590 Thyrotoxicosis, unspecified without thyrotoxic crisis or storm: Secondary | ICD-10-CM | POA: Diagnosis not present

## 2016-01-13 DIAGNOSIS — F329 Major depressive disorder, single episode, unspecified: Secondary | ICD-10-CM

## 2016-01-13 DIAGNOSIS — G2581 Restless legs syndrome: Secondary | ICD-10-CM

## 2016-01-13 DIAGNOSIS — R143 Flatulence: Secondary | ICD-10-CM

## 2016-01-13 DIAGNOSIS — M1990 Unspecified osteoarthritis, unspecified site: Secondary | ICD-10-CM

## 2016-01-13 DIAGNOSIS — I5032 Chronic diastolic (congestive) heart failure: Secondary | ICD-10-CM

## 2016-01-13 DIAGNOSIS — I2782 Chronic pulmonary embolism: Secondary | ICD-10-CM | POA: Diagnosis not present

## 2016-01-13 DIAGNOSIS — E785 Hyperlipidemia, unspecified: Secondary | ICD-10-CM

## 2016-01-13 DIAGNOSIS — K219 Gastro-esophageal reflux disease without esophagitis: Secondary | ICD-10-CM

## 2016-01-13 DIAGNOSIS — E876 Hypokalemia: Secondary | ICD-10-CM | POA: Diagnosis not present

## 2016-01-13 DIAGNOSIS — E43 Unspecified severe protein-calorie malnutrition: Secondary | ICD-10-CM | POA: Diagnosis not present

## 2016-01-13 DIAGNOSIS — D509 Iron deficiency anemia, unspecified: Secondary | ICD-10-CM

## 2016-01-13 DIAGNOSIS — F32A Depression, unspecified: Secondary | ICD-10-CM

## 2016-01-13 DIAGNOSIS — N3281 Overactive bladder: Secondary | ICD-10-CM | POA: Diagnosis not present

## 2016-01-13 DIAGNOSIS — I10 Essential (primary) hypertension: Secondary | ICD-10-CM | POA: Diagnosis not present

## 2016-01-13 DIAGNOSIS — F29 Unspecified psychosis not due to a substance or known physiological condition: Secondary | ICD-10-CM

## 2016-01-13 NOTE — Progress Notes (Signed)
Patient ID: Katie Evans, female   DOB: [birth year], 70 y.o.   MRN: [id number]   DATE:   01/13/16  Facility:  Nursing Home Location:  [facility] Room Number: 1005-2 LEVEL OF CARE:  SNF (31)   Chief Complaint  Patient presents with  . Medical Management of Chronic Issues    Pulmonary embolism, osteoarthritis, protein calorie malnutrition,  diastolic CHF, hyperthyroidism, anxiety, anemia, allergic rhinitis, hyperlipidemia, GERD,  hypertension, depression,  RLS, flatulence, psychosis and overactive bladder     HISTORY OF PRESENT ILLNESS:  This is a 70 year old female who is being seen for a routine visit. It was noted upon lab review that tsh 0.023, low. She is currently taking Tapazole for hyperthyroidism. She now takes Norco 5/325 mg BID routinely due to complaints of back and leg pains. No SOB has been noted. On chronic Coumadin due to PE.  PAST MEDICAL HISTORY:  Past Medical History  Diagnosis Date  . Renal disorder   . CHF (congestive heart failure) (HCC)   . Arthritis   . Hypertension   . Carpal tunnel syndrome   . Metabolic encephalopathy   . Anemia   . Major depressive disorder (HCC)   . Thrombocytopathia (HCC)   . Demand ischemia of myocardium (HCC)   . Chronic pulmonary embolism (HCC)   . Protein calorie malnutrition (HCC)   . Allergic rhinitis   . Hyperlipidemia   . Hyperthyroidism   . Generalized anxiety disorder   . GERD without esophagitis   . Hypokalemia   . Overactive bladder   . Psychosis   . RLS (restless legs syndrome)   . Chronic diarrhea   . Flatulence     CURRENT MEDICATIONS: Reviewed per MAR/see medication list   Medication List       This list is accurate as of: 01/13/16 11:59 PM.  Always use your most recent med list.               ARTIFICIAL TEARS OP  Apply to eye 3 (three) times daily as needed. Both eyes     aspirin 81 MG chewable tablet  Chew 81 mg by mouth daily.     chlorhexidine 0.12 % solution   Commonly known as:  PERIDEX  Use as directed in the mouth or throat. Swish after brushing after meals for gingivitis     Cholecalciferol 1000 units tablet  Take 1,000 Units by mouth daily.     clonazePAM 1 MG tablet  Commonly known as:  KLONOPIN  Take 1 mg by mouth at bedtime.     DECUBI-VITE PO  Take 1 capsule by mouth daily.     DEVROM 200 MG Chew  Generic drug:  Bismuth Subgallate  Chew 1 tablet by mouth daily.     diclofenac sodium 1 % Gel  Commonly known as:  VOLTAREN  Apply 2 g topically 2 (two) times daily as needed. Apply to bilateral shoulders and knees BID PRN     diphenoxylate-atropine 2.5-0.025 MG tablet  Commonly known as:  LOMOTIL  Take by mouth. May take up to 6 per day as needed     famotidine 20 MG tablet  Commonly known as:  PEPCID  Take 20 mg by mouth daily.     FEROSUL PO  Take 325 mg by mouth 2 (two) times daily.     HYDROcodone-acetaminophen 5-325 MG tablet  Commonly known as:  NORCO/VICODIN  Take 1 tablet by mouth 2 (two) times daily.     HYDROcodone-acetaminophen  5-325 MG tablet  Commonly known as:  NORCO/VICODIN  Take 1 tablet by mouth every 4 (four) hours as needed for moderate pain.     hydrocortisone 25 MG suppository  Commonly known as:  ANUSOL-HC  Place 25 mg rectally 2 (two) times daily as needed.     LIPITOR 40 MG tablet  Generic drug:  atorvastatin  Take 40 mg by mouth at bedtime.     loperamide 2 MG capsule  Commonly known as:  IMODIUM  Take 2 mg by mouth 4 (four) times daily as needed for diarrhea or loose stools.     loratadine 10 MG tablet  Commonly known as:  CLARITIN  Take 10 mg by mouth daily as needed.     meloxicam 7.5 MG tablet  Commonly known as:  MOBIC  Take 7.5 mg by mouth daily.     methimazole 5 MG tablet  Commonly known as:  TAPAZOLE  Take 5 mg by mouth daily. Tale three (3) tabs to equal 15 mg PO QD     nadolol 20 MG tablet  Commonly known as:  CORGARD  Take 20 mg by mouth daily.     nitroGLYCERIN  0.4 MG SL tablet  Commonly known as:  NITROSTAT  Place 0.4 mg under the tongue every 5 (five) minutes as needed for chest pain. For 3 doses as needed for chest pain     nortriptyline 25 MG capsule  Commonly known as:  PAMELOR  Take 25 mg by mouth at bedtime.     oxybutynin 5 MG tablet  Commonly known as:  DITROPAN  Take 5 mg by mouth 3 (three) times daily as needed for bladder spasms.     polycarbophil 625 MG tablet  Commonly known as:  FIBERCON  Take 625 mg by mouth daily as needed.     potassium chloride SA 20 MEQ tablet  Commonly known as:  K-DUR,KLOR-CON  Take 20 mEq by mouth 2 (two) times daily. Take with food.  Do not crush.  May dissolve.     pramipexole 1.5 MG tablet  Commonly known as:  MIRAPEX  Take 1.5 mg by mouth 2 (two) times daily.     risperiDONE 0.25 MG tablet  Commonly known as:  RISPERDAL  Take 0.25 mg by mouth at bedtime.     sertraline 100 MG tablet  Commonly known as:  ZOLOFT  Take 175 mg by mouth daily. Give 1 tablet with a 50 MG and a 25 MG tablet to = 175 mg po daily     torsemide 20 MG tablet  Commonly known as:  DEMADEX  Take 20 mg by mouth daily.           Allergies  Allergen Reactions  . Remeron [Mirtazapine]      REVIEW OF SYSTEMS:  GENERAL: no change in appetite, no fatigue, no weight changes, no fever, chills or weakness RESPIRATORY: no cough, SOB, DOE, wheezing, hemoptysis CARDIAC: no chest pain,  or palpitations GI: no abdominal pain, heart burn  PHYSICAL EXAMINATION  GENERAL: no acute distress EYES: conjunctivae normal, sclerae normal, normal eye lids NECK: supple, trachea midline, no neck masses, no thyroid tenderness, no thyromegaly LYMPHATICS: no LAN in the neck, no supraclavicular LAN RESPIRATORY: breathing is even & unlabored, BS CTAB CARDIAC: RRR, no murmur,no extra heart sounds, BLE edema 3+ GI: abdomen soft, normal BS, no masses, no tenderness, no hepatomegaly, no splenomegaly EXTREMITIES: Able to move all 4  extremities; uses wheelchair to move around PSYCHIATRIC: the patient is  alert & oriented to person, affect & behavior appropriate  LABS/RADIOLOGY: Labs reviewed:  Basic Metabolic Panel:  Recent Labs  40/98/11 1029  10/12/15 12/21/15 01/09/16  NA 135  < > 138 141 141  K 3.7  < > 4.2 4.2 3.5  CL 102  --   --   --   --   CO2 27  --   --   --   --   GLUCOSE 103*  --   --   --   --   BUN 26*  < > 29* 31* 35*  CREATININE 1.03*  < > 1.0 1.2* 1.2*  CALCIUM 9.4  --   --   --   --   < > = values in this interval not displayed. Liver Function Tests:  Recent Labs  10/08/15 10/12/15  AST 16 25  ALT 11 23  ALKPHOS 72 212*   CBC:  Recent Labs  05/10/15 1029 10/08/15 10/12/15  WBC 7.2 5.2 5.5  NEUTROABS 3.9  --   --   HGB 9.7* 10.2* 10.4*  HCT 30.5* 32* 35*  MCV 87.1  --   --   PLT 157 101* 104*     06/24/15  total bilirubin 0.5 direct bilirubin 0.2 indirect bilirubin 0.3 SGOT 31 alkaline phosphatase 257 SGPT 19 and total protein 6.1 albumin 3.3 globulin 2.8 Dever isoenzyme 21 06/21/15  TSH 38.506 05/28/15  urine culture shows >= 100,000 colonies/ML Klebsiella pneumoniae 05/18/15  total T4 2 0.6 TSH <0.008 05/09/15  WBC 8.1 hemoglobin 10.2 hematocrit 32.3 MCV 87.3 platelets 177 sodium 138 potassium 3.6 glucose 91 BUN 27 creatinine 1.09 calcium 9.4 04/29/15  WBC 5.7 hemoglobin 8.2 hematocrit 25.5 MCV 85.6 platelet 98 sodium 140 potassium 3.3 glucose 108 BUN 25 creatinine 1.13 total bilirubin 0.5 alkaline phosphatase 117 SGOT 30 SGPT 22 total protein 4.4 albumin 2.3 calcium 8.9 cholesterol 71 triglycerides 42 HDL 47 LDL 16 TSH <0.008 ferritin 292 04/12/15  sodium 139 potassium 3.5 glucose 104 BUN 26 creatinine 0.76 calcium 9.4 04/07/15  WBC 4.8 hemoglobin 9.1 hematocrit 28.9 MCV 85.3 platelet 95 03/30/15  WBC 5.9 hemoglobin 8.7 hematocrit 27.1 MCV 82.4 platelets 72 03/29/15  sodium 142 potassium 2.9 glucose 90 BUN 24 creatinine 0.80 calcium 9.0  03/21/15  WBC 5.3 hemoglobin 8.9 hematocrit 27.3  MCV 81.5 platelet 94 sodium 139 potassium 2.9 glucose 86 BUN 19 creatinine 0.98 total bilirubin 0.8 alkaline phosphatase 120 SGOT 71 SGPT 38 total protein 4.3 albumin 9.2 02/03/15  WBC 7.5 hemoglobin 9.0 hematocrit 28.4 MCV 95.3 albumin 2.2   ASSESSMENT/PLAN:  Pulmonary embolism - no SOB; continue Coumadin  Chronic diastolic CHF - continue Demadex 20 mg 1 tab by mouth daily and ; weigh Q Mondays and Wednesdays; continue 1800 ml fluid restriction  Hypertension - well controlled; continue Nadolol 20 mg by mouth daily  Hyperlipidemia - continue Lipitor 40 mg by mouth daily at bedtime  Anxiety - mood is is stable; continue Klonopin 1 mg by mouth daily   Depression - continue Zoloft 175 mg by mouth daily and Pamelor 25 mg PO Q HS  Chronic diarrhea -hx of gastric bypass surgery; continue Imodium 2 mg by mouth 4 times a day when necessary  Hypokalemia -  K 3.5; continue KCl 20 MEQ  ER  by mouth twice a day  Anemia, iron deficiency -  continue ferrous sulfate 325 mg by mouth BID; check CBC   Protein calorie malnutrition, severe -  continue supplementation  Overactive Bladder - continue Oxybutynin 5  mg 1 tab PO TID PRN  GERD - continue Pepcid 20 mg 1 tab PO Q D  Hyperthyroidism - tsh 0.023; recently increased Methimazole 5 mg take 3 tabs = 15 mg daily; for tsh in 5 weeks  RLS - stablecontinue Mirapex 1.5 mg BID  Psychosis - no agitation reported; continue Risperidone 0.25 mg Q HS  Flatulence - continue Devrom 200 mg daily  Osteoarthritis - continue Voltaren gel 1% 2 g topically to bilateral shoulders and bilateral knees twice a day when necessary and on Mobic 7.5 mg by mouth daily; recently started Norco 5/325 mg 1 tab BID      Goals of care:  Long-term care   [facility], NP [facility] [phone]

## 2016-01-19 ENCOUNTER — Non-Acute Institutional Stay (SKILLED_NURSING_FACILITY): Payer: Medicare Other | Admitting: Adult Health

## 2016-01-19 ENCOUNTER — Encounter: Payer: Self-pay | Admitting: Adult Health

## 2016-01-19 DIAGNOSIS — Z7901 Long term (current) use of anticoagulants: Secondary | ICD-10-CM

## 2016-01-19 DIAGNOSIS — I2782 Chronic pulmonary embolism: Secondary | ICD-10-CM

## 2016-01-21 NOTE — Progress Notes (Signed)
Patient ID: Katie Evans, female   DOB: [birth year], 70 y.o.   MRN: [id number] Subjective:     Indication: PE Bleeding signs/symptoms: None Thromboembolic signs/symptoms: None  Missed Coumadin doses: None Medication changes: no Dietary changes: no Bacterial/viral infection: no Other concerns: no  The following portions of the patient's history were reviewed and updated as appropriate: allergies, current medications, past family history, past medical history, past social history, past surgical history and problem list.  Review of Systems A comprehensive review of systems was negative.   Objective:    INR Today: 1.8 Current dose: Coumadin 3 mg     Assessment:    Subtherapeutic INR for goal of 2-3   Plan:    1. New dose: Increase Coumadin 3.5 mg daily   2. Next INR: 01/23/16

## 2016-01-23 LAB — CBC AND DIFFERENTIAL
HEMATOCRIT: 36 % (ref 36–46)
Hemoglobin: 10.8 g/dL — AB (ref 12.0–16.0)
PLATELETS: 137 10*3/uL — AB (ref 150–399)
WBC: 6.5 10*3/mL

## 2016-01-24 ENCOUNTER — Encounter: Payer: Self-pay | Admitting: Adult Health

## 2016-01-24 ENCOUNTER — Non-Acute Institutional Stay (SKILLED_NURSING_FACILITY): Payer: Medicare Other | Admitting: Adult Health

## 2016-01-24 DIAGNOSIS — N952 Postmenopausal atrophic vaginitis: Secondary | ICD-10-CM | POA: Diagnosis not present

## 2016-01-24 NOTE — Progress Notes (Signed)
Patient ID: Katie Evans, female   DOB: 03-09-1946, 70 y.o.   MRN: [id number]   DATE:   01/24/16  Facility:  Nursing Home Location:  [facility] Room Number: 1005-2 LEVEL OF CARE:  SNF (31)   Chief Complaint  Patient presents with  . Acute Visit :  Vagina atrophy and BLE edema        HISTORY OF PRESENT ILLNESS:  This is a 70 year old female who complains of "dryness in her vagina causing her to have spasm on her legs." She had Premarin cream before but was discontinued per patient request.She would like Premarin to be re instituted. No complaints of itching. Patient is requesting for her Torsemide to be increased due to BLE. Edema 3+. No erythema noted.   PAST MEDICAL HISTORY:  Past Medical History  Diagnosis Date  . Renal disorder   . CHF (congestive heart failure) (HCC)   . Arthritis   . Hypertension   . Carpal tunnel syndrome   . Metabolic encephalopathy   . Anemia   . Major depressive disorder (HCC)   . Thrombocytopathia (HCC)   . Demand ischemia of myocardium (HCC)   . Chronic pulmonary embolism (HCC)   . Protein calorie malnutrition (HCC)   . Allergic rhinitis   . Hyperlipidemia   . Hyperthyroidism   . Generalized anxiety disorder   . GERD without esophagitis   . Hypokalemia   . Overactive bladder   . Psychosis   . RLS (restless legs syndrome)   . Chronic diarrhea   . Flatulence     CURRENT MEDICATIONS: Reviewed per MAR/see medication list   Medication List       This list is accurate as of: 01/24/16  6:22 PM.  Always use your most recent med list.               ARTIFICIAL TEARS OP  Apply to eye 3 (three) times daily as needed. Both eyes     aspirin 81 MG chewable tablet  Chew 81 mg by mouth daily.     chlorhexidine 0.12 % solution  Commonly known as:  PERIDEX  Use as directed in the mouth or throat. Swish after brushing after meals for gingivitis     Cholecalciferol 1000 units tablet  Take 1,000 Units by mouth  daily.     clonazePAM 1 MG tablet  Commonly known as:  KLONOPIN  Take 1 mg by mouth at bedtime.     COUMADIN PO  Take 3.5 mg by mouth daily.     DECUBI-VITE PO  Take 1 capsule by mouth daily.     DEVROM 200 MG Chew  Generic drug:  Bismuth Subgallate  Chew 1 tablet by mouth daily.     diclofenac sodium 1 % Gel  Commonly known as:  VOLTAREN  Apply 2 g topically 2 (two) times daily as needed. Apply to bilateral shoulders and knees BID PRN     diphenoxylate-atropine 2.5-0.025 MG tablet  Commonly known as:  LOMOTIL  Take by mouth. May take up to 6 per day as needed     famotidine 20 MG tablet  Commonly known as:  PEPCID  Take 20 mg by mouth daily.     FEROSUL PO  Take 325 mg by mouth 2 (two) times daily.     HYDROcodone-acetaminophen 5-325 MG tablet  Commonly known as:  NORCO/VICODIN  Take 1 tablet by mouth 2 (two) times daily.     HYDROcodone-acetaminophen 5-325 MG tablet  Commonly  known as:  NORCO/VICODIN  Take 1 tablet by mouth every 4 (four) hours as needed for moderate pain.     hydrocortisone 25 MG suppository  Commonly known as:  ANUSOL-HC  Place 25 mg rectally 2 (two) times daily as needed.     LIPITOR 40 MG tablet  Generic drug:  atorvastatin  Take 40 mg by mouth at bedtime.     loperamide 2 MG capsule  Commonly known as:  IMODIUM  Take 2 mg by mouth 4 (four) times daily as needed for diarrhea or loose stools.     loratadine 10 MG tablet  Commonly known as:  CLARITIN  Take 10 mg by mouth daily as needed.     meloxicam 7.5 MG tablet  Commonly known as:  MOBIC  Take 7.5 mg by mouth daily.     methimazole 5 MG tablet  Commonly known as:  TAPAZOLE  Take 5 mg by mouth daily. Tale three (3) tabs to equal 15 mg PO QD     nadolol 20 MG tablet  Commonly known as:  CORGARD  Take 20 mg by mouth daily.     nitroGLYCERIN 0.4 MG SL tablet  Commonly known as:  NITROSTAT  Place 0.4 mg under the tongue every 5 (five) minutes as needed for chest pain. For 3  doses as needed for chest pain     nortriptyline 25 MG capsule  Commonly known as:  PAMELOR  Take 25 mg by mouth at bedtime.     oxybutynin 5 MG tablet  Commonly known as:  DITROPAN  Take 5 mg by mouth 3 (three) times daily as needed for bladder spasms.     polycarbophil 625 MG tablet  Commonly known as:  FIBERCON  Take 625 mg by mouth daily as needed.     potassium chloride SA 20 MEQ tablet  Commonly known as:  K-DUR,KLOR-CON  Take 20 mEq by mouth 2 (two) times daily. Take with food.  Do not crush.  May dissolve.     pramipexole 1.5 MG tablet  Commonly known as:  MIRAPEX  Take 1.5 mg by mouth 2 (two) times daily.     risperiDONE 0.25 MG tablet  Commonly known as:  RISPERDAL  Take 0.25 mg by mouth at bedtime.     SECURA EXTRA PROTECTIVE EX  Apply topically as needed. Apply to sacrum and buttocks     sertraline 100 MG tablet  Commonly known as:  ZOLOFT  Take 175 mg by mouth daily. Give 1 tablet with a 50 MG and a 25 MG tablet to = 175 mg po daily     torsemide 20 MG tablet  Commonly known as:  DEMADEX  Take 20 mg by mouth daily.           Allergies  Allergen Reactions  . Remeron [Mirtazapine]      REVIEW OF SYSTEMS:  GENERAL: no change in appetite, no fatigue, no weight changes, no fever, chills or weakness RESPIRATORY: no cough, SOB, DOE, wheezing, hemoptysis CARDIAC: no chest pain,  or palpitations GI: no abdominal pain, heart burn   PHYSICAL EXAMINATION  GENERAL: no acute distress EYES: conjunctivae normal, sclerae normal, normal eye lids NECK: supple, trachea midline, no neck masses, no thyroid tenderness, no thyromegaly LYMPHATICS: no LAN in the neck, no supraclavicular LAN RESPIRATORY: breathing is even & unlabored, BS CTAB CARDIAC: RRR, no murmur,no extra heart sounds, BLE edema 3+ GI: abdomen soft, normal BS, no masses, no tenderness, no hepatomegaly, no splenomegaly GU:  No  lesions nor discharge from vaginal area EXTREMITIES: Able to move all  4 extremities; uses wheelchair to move around PSYCHIATRIC: the patient is alert & oriented to person, affect & behavior appropriate  LABS/RADIOLOGY: Labs reviewed:  Basic Metabolic Panel:  Recent Labs  40/98/11 1029  10/12/15 12/21/15 01/09/16  NA 135  < > 138 141 141  K 3.7  < > 4.2 4.2 3.5  CL 102  --   --   --   --   CO2 27  --   --   --   --   GLUCOSE 103*  --   --   --   --   BUN 26*  < > 29* 31* 35*  CREATININE 1.03*  < > 1.0 1.2* 1.2*  CALCIUM 9.4  --   --   --   --   < > = values in this interval not displayed. Liver Function Tests:  Recent Labs  10/08/15 10/12/15  AST 16 25  ALT 11 23  ALKPHOS 72 212*   CBC:  Recent Labs  05/10/15 1029 10/08/15 10/12/15  WBC 7.2 5.2 5.5  NEUTROABS 3.9  --   --   HGB 9.7* 10.2* 10.4*  HCT 30.5* 32* 35*  MCV 87.1  --   --   PLT 157 101* 104*     06/24/15  total bilirubin 0.5 direct bilirubin 0.2 indirect bilirubin 0.3 SGOT 31 alkaline phosphatase 257 SGPT 19 and total protein 6.1 albumin 3.3 globulin 2.8 Dever isoenzyme 21 06/21/15  TSH 38.506 05/28/15  urine culture shows >= 100,000 colonies/ML Klebsiella pneumoniae 05/18/15  total T4 2 0.6 TSH <0.008 05/09/15  WBC 8.1 hemoglobin 10.2 hematocrit 32.3 MCV 87.3 platelets 177 sodium 138 potassium 3.6 glucose 91 BUN 27 creatinine 1.09 calcium 9.4 04/29/15  WBC 5.7 hemoglobin 8.2 hematocrit 25.5 MCV 85.6 platelet 98 sodium 140 potassium 3.3 glucose 108 BUN 25 creatinine 1.13 total bilirubin 0.5 alkaline phosphatase 117 SGOT 30 SGPT 22 total protein 4.4 albumin 2.3 calcium 8.9 cholesterol 71 triglycerides 42 HDL 47 LDL 16 TSH <0.008 ferritin 292 04/12/15  sodium 139 potassium 3.5 glucose 104 BUN 26 creatinine 0.76 calcium 9.4 04/07/15  WBC 4.8 hemoglobin 9.1 hematocrit 28.9 MCV 85.3 platelet 95 03/30/15  WBC 5.9 hemoglobin 8.7 hematocrit 27.1 MCV 82.4 platelets 72 03/29/15  sodium 142 potassium 2.9 glucose 90 BUN 24 creatinine 0.80 calcium 9.0  03/21/15  WBC 5.3 hemoglobin 8.9 hematocrit  27.3 MCV 81.5 platelet 94 sodium 139 potassium 2.9 glucose 86 BUN 19 creatinine 0.98 total bilirubin 0.8 alkaline phosphatase 120 SGOT 71 SGPT 38 total protein 4.3 albumin 9.2 02/03/15  WBC 7.5 hemoglobin 9.0 hematocrit 28.4 MCV 95.3 albumin 2.2   ASSESSMENT/PLAN:  Vaginal atrophy - start Premarin 0.625 mg/gm apply 0.5 gm intravaginally 2X/weeK  Bilateral lower extremity edema - increase Torsemide to 20 mg 1 tab PO BID; CBC and BMP in 1 week     [facility] NP [employer] [phone]

## 2016-01-31 LAB — CBC AND DIFFERENTIAL
HEMATOCRIT: 33 % — AB (ref 36–46)
HEMOGLOBIN: 10.2 g/dL — AB (ref 12.0–16.0)
PLATELETS: 107 10*3/uL — AB (ref 150–399)
WBC: 4.8 10^3/mL

## 2016-01-31 LAB — BASIC METABOLIC PANEL
BUN: 44 mg/dL — AB (ref 4–21)
CREATININE: 1.2 mg/dL — AB (ref 0.5–1.1)
Glucose: 129 mg/dL
POTASSIUM: 3.5 mmol/L (ref 3.4–5.3)
SODIUM: 142 mmol/L (ref 137–147)

## 2016-02-01 ENCOUNTER — Non-Acute Institutional Stay (SKILLED_NURSING_FACILITY): Payer: Medicare Other | Admitting: Adult Health

## 2016-02-01 ENCOUNTER — Encounter: Payer: Self-pay | Admitting: Adult Health

## 2016-02-01 DIAGNOSIS — T148 Other injury of unspecified body region: Secondary | ICD-10-CM | POA: Diagnosis not present

## 2016-02-01 DIAGNOSIS — T148XXA Other injury of unspecified body region, initial encounter: Secondary | ICD-10-CM

## 2016-02-01 DIAGNOSIS — D696 Thrombocytopenia, unspecified: Secondary | ICD-10-CM | POA: Diagnosis not present

## 2016-02-01 DIAGNOSIS — M25511 Pain in right shoulder: Secondary | ICD-10-CM | POA: Diagnosis not present

## 2016-02-01 DIAGNOSIS — Z7901 Long term (current) use of anticoagulants: Secondary | ICD-10-CM

## 2016-02-01 NOTE — Progress Notes (Signed)
Patient ID: Katie Evans, female   DOB: [birth year], 70 y.o.   MRN: [id number]   DATE:   02/01/16  Facility:  Nursing Home Location:  [facility] Room Number: 1005-2 LEVEL OF CARE:  SNF (31)   Chief Complaint  Patient presents with  . Acute Visit :  Bruising, thrombocytopenia and right shoulder pain        HISTORY OF PRESENT ILLNESS:  This is a 70 year old female who was noted to have bruising and hematoma on right forearm, bruising on left antecubital area and left upper arm. No bruising noted on the trunk nor the lower extremities. Patient had a blood extraction on 01/31/16. Noted platelet 107, low. She is on chronic Coumadin therapy for pulmonary embolism. INR was 2.4, therapeutic. Latest hgb 10.2. Patient complained of right shoulder pain. Patient demonstrated that she was able to do full ROM on right shoulder but complained that it was painful. She reported that she did not have any incident of falling nor rough handled by staff.  PAST MEDICAL HISTORY:  Past Medical History  Diagnosis Date  . Renal disorder   . CHF (congestive heart failure) (HCC)   . Arthritis   . Hypertension   . Carpal tunnel syndrome   . Metabolic encephalopathy   . Anemia   . Major depressive disorder (HCC)   . Thrombocytopathia (HCC)   . Demand ischemia of myocardium (HCC)   . Chronic pulmonary embolism (HCC)   . Protein calorie malnutrition (HCC)   . Allergic rhinitis   . Hyperlipidemia   . Hyperthyroidism   . Generalized anxiety disorder   . GERD without esophagitis   . Hypokalemia   . Overactive bladder   . Psychosis   . RLS (restless legs syndrome)   . Chronic diarrhea   . Flatulence     CURRENT MEDICATIONS: Reviewed per MAR/see medication list   Medication List       This list is accurate as of: 02/01/16  4:45 PM.  Always use your most recent med list.               ARTIFICIAL TEARS OP  Apply to eye 3 (three) times daily as needed. Both eyes     aspirin 81 MG chewable tablet  Chew 81 mg by mouth daily.     clonazePAM 1 MG tablet  Commonly known as:  KLONOPIN  Take 1 mg by mouth at bedtime.     COUMADIN PO  Take 3.5 mg by mouth daily.     D3-1000 PO  Take 1 tablet by mouth daily.     DEVROM 200 MG Chew  Generic drug:  Bismuth Subgallate  Chew 1 tablet by mouth daily.     diclofenac sodium 1 % Gel  Commonly known as:  VOLTAREN  Apply 2 g topically 2 (two) times daily as needed. Apply to bilateral shoulders and knees BID PRN     diphenoxylate-atropine 2.5-0.025 MG tablet  Commonly known as:  LOMOTIL  Take by mouth. May take up to 6 per day as needed     estrogens (conjugated) 0.625 MG tablet  Commonly known as:  PREMARIN  Place 0.625 mg vaginally 2 (two) times a week.     famotidine 20 MG tablet  Commonly known as:  PEPCID  Take 20 mg by mouth daily.     FEROSUL PO  Take 325 mg by mouth 2 (two) times daily.     HYDROcodone-acetaminophen 5-325 MG tablet  Commonly known  as:  NORCO/VICODIN  Take 1 tablet by mouth 2 (two) times daily.     HYDROcodone-acetaminophen 5-325 MG tablet  Commonly known as:  NORCO/VICODIN  Take 1 tablet by mouth every 4 (four) hours as needed for moderate pain.     hydrocortisone 25 MG suppository  Commonly known as:  ANUSOL-HC  Place 25 mg rectally 2 (two) times daily as needed.     LIPITOR 40 MG tablet  Generic drug:  atorvastatin  Take 40 mg by mouth at bedtime.     loperamide 2 MG capsule  Commonly known as:  IMODIUM  Take 2 mg by mouth 4 (four) times daily as needed for diarrhea or loose stools.     loratadine 10 MG tablet  Commonly known as:  CLARITIN  Take 10 mg by mouth daily as needed.     meloxicam 7.5 MG tablet  Commonly known as:  MOBIC  Take 7.5 mg by mouth daily.     methimazole 5 MG tablet  Commonly known as:  TAPAZOLE  Take 5 mg by mouth daily. Take three (3) tabs to equal 15 mg PO QD     nadolol 20 MG tablet  Commonly known as:  CORGARD  Take 20 mg by  mouth daily.     nitroGLYCERIN 0.4 MG SL tablet  Commonly known as:  NITROSTAT  Place 0.4 mg under the tongue every 5 (five) minutes as needed for chest pain. For 3 doses as needed for chest pain     nortriptyline 25 MG capsule  Commonly known as:  PAMELOR  Take 25 mg by mouth at bedtime.     oxybutynin 5 MG tablet  Commonly known as:  DITROPAN  Take 5 mg by mouth 3 (three) times daily as needed for bladder spasms.     polycarbophil 625 MG tablet  Commonly known as:  FIBERCON  Take 625 mg by mouth daily as needed.     potassium chloride SA 20 MEQ tablet  Commonly known as:  K-DUR,KLOR-CON  Take 20 mEq by mouth 2 (two) times daily. Take with food.  Do not crush.  May dissolve.     pramipexole 1.5 MG tablet  Commonly known as:  MIRAPEX  Take 1.5 mg by mouth 2 (two) times daily.     risperiDONE 0.25 MG tablet  Commonly known as:  RISPERDAL  Take 0.25 mg by mouth at bedtime.     SECURA EXTRA PROTECTIVE EX  Apply topically as needed. Apply to sacrum and buttocks     sertraline 100 MG tablet  Commonly known as:  ZOLOFT  Take 175 mg by mouth daily. Give 1 tablet with a 50 MG and a 25 MG tablet to = 175 mg po daily     torsemide 20 MG tablet  Commonly known as:  DEMADEX  Take 20 mg by mouth 2 (two) times daily.           Allergies  Allergen Reactions  . Remeron [Mirtazapine]      REVIEW OF SYSTEMS:  GENERAL: no change in appetite, no fatigue, no weight changes, no fever, chills or weakness RESPIRATORY: no cough, SOB, DOE, wheezing, hemoptysis CARDIAC: no chest pain,  or palpitations GI: no abdominal pain, heart burn   PHYSICAL EXAMINATION  GENERAL: no acute distress SKIN:   Bruising on right forearm with hematoma; bruising on left antecubital area and left upper arm EYES: conjunctivae normal, sclerae normal, normal eye lids NECK: supple, trachea midline, no neck masses, no thyroid tenderness, no  thyromegaly LYMPHATICS: no LAN in the neck, no supraclavicular  LAN RESPIRATORY: breathing is even & unlabored, BS CTAB CARDIAC: RRR, no murmur,no extra heart sounds, BLE edema 3+ GI: abdomen soft, normal BS, no masses, no tenderness, no hepatomegaly, no splenomegaly GU:  No lesions nor discharge from vaginal area EXTREMITIES: Able to move all 4 extremities; uses wheelchair to move around PSYCHIATRIC: the patient is alert & oriented to person, affect & behavior appropriate  LABS/RADIOLOGY: Labs reviewed: 01/31/16  WBC 4.8 hemoglobin 7.2 hematocrit 33.1 MCV 89.7 platelet 107 sodium 142 potassium 3.5 glucose 129 BUN 44 creatinine 1.23 calcium 9.0 Basic Metabolic Panel:  Recent Labs  16/10/96 1029  10/12/15 12/21/15 01/09/16  NA 135  < > 138 141 141  K 3.7  < > 4.2 4.2 3.5  CL 102  --   --   --   --   CO2 27  --   --   --   --   GLUCOSE 103*  --   --   --   --   BUN 26*  < > 29* 31* 35*  CREATININE 1.03*  < > 1.0 1.2* 1.2*  CALCIUM 9.4  --   --   --   --   < > = values in this interval not displayed. Liver Function Tests:  Recent Labs  10/08/15 10/12/15  AST 16 25  ALT 11 23  ALKPHOS 72 212*   CBC:  Recent Labs  05/10/15 1029 10/08/15 10/12/15 01/23/16  WBC 7.2 5.2 5.5 6.5  NEUTROABS 3.9  --   --   --   HGB 9.7* 10.2* 10.4* 10.8*  HCT 30.5* 32* 35* 36  MCV 87.1  --   --   --   PLT 157 101* 104* 137*     06/24/15  total bilirubin 0.5 direct bilirubin 0.2 indirect bilirubin 0.3 SGOT 31 alkaline phosphatase 257 SGPT 19 and total protein 6.1 albumin 3.3 globulin 2.8 Dever isoenzyme 21 06/21/15  TSH 38.506 05/28/15  urine culture shows >= 100,000 colonies/ML Klebsiella pneumoniae 05/18/15  total T4 2 0.6 TSH <0.008 05/09/15  WBC 8.1 hemoglobin 10.2 hematocrit 32.3 MCV 87.3 platelets 177 sodium 138 potassium 3.6 glucose 91 BUN 27 creatinine 1.09 calcium 9.4 04/29/15  WBC 5.7 hemoglobin 8.2 hematocrit 25.5 MCV 85.6 platelet 98 sodium 140 potassium 3.3 glucose 108 BUN 25 creatinine 1.13 total bilirubin 0.5 alkaline phosphatase 117 SGOT 30 SGPT  22 total protein 4.4 albumin 2.3 calcium 8.9 cholesterol 71 triglycerides 42 HDL 47 LDL 16 TSH <0.008 ferritin 292 04/12/15  sodium 139 potassium 3.5 glucose 104 BUN 26 creatinine 0.76 calcium 9.4 04/07/15  WBC 4.8 hemoglobin 9.1 hematocrit 28.9 MCV 85.3 platelet 95 03/30/15  WBC 5.9 hemoglobin 8.7 hematocrit 27.1 MCV 82.4 platelets 72 03/29/15  sodium 142 potassium 2.9 glucose 90 BUN 24 creatinine 0.80 calcium 9.0  03/21/15  WBC 5.3 hemoglobin 8.9 hematocrit 27.3 MCV 81.5 platelet 94 sodium 139 potassium 2.9 glucose 86 BUN 19 creatinine 0.98 total bilirubin 0.8 alkaline phosphatase 120 SGOT 71 SGPT 38 total protein 4.3 albumin 9.2 02/03/15  WBC 7.5 hemoglobin 9.0 hematocrit 28.4 MCV 95.3 albumin 2.2   ASSESSMENT/PLAN:  Thrombocytopenia - platelet 107, low; patient can be bruised easily when platelet is low; instruct patient to avoid bumping body and staff to assist patient gently; will check CBC in 1 week  Bruising - keep skin clean and dry; monitor for additional bruising    Right shoulder pain - x-ray of right shoulder; offer ice pack as  needed and Norco 5/325 mg 1 tab PO Q 4 hours PRN  Long-term use of anticoagulant - INR 2.4, therapeutic; continue to take Coumadin 3.5 mg dail and repeat INR on 02/07/16     [facility] LL[facility]EDINA-VARGAS,MONINA, NP [facility] [phone]

## 2016-02-09 ENCOUNTER — Encounter: Payer: Self-pay | Admitting: Adult Health

## 2016-02-09 ENCOUNTER — Non-Acute Institutional Stay (SKILLED_NURSING_FACILITY): Payer: Medicare Other | Admitting: Adult Health

## 2016-02-09 DIAGNOSIS — N3281 Overactive bladder: Secondary | ICD-10-CM | POA: Diagnosis not present

## 2016-02-09 DIAGNOSIS — I5032 Chronic diastolic (congestive) heart failure: Secondary | ICD-10-CM | POA: Diagnosis not present

## 2016-02-09 DIAGNOSIS — E785 Hyperlipidemia, unspecified: Secondary | ICD-10-CM

## 2016-02-09 DIAGNOSIS — E876 Hypokalemia: Secondary | ICD-10-CM

## 2016-02-09 DIAGNOSIS — E43 Unspecified severe protein-calorie malnutrition: Secondary | ICD-10-CM

## 2016-02-09 DIAGNOSIS — K219 Gastro-esophageal reflux disease without esophagitis: Secondary | ICD-10-CM

## 2016-02-09 DIAGNOSIS — D509 Iron deficiency anemia, unspecified: Secondary | ICD-10-CM | POA: Diagnosis not present

## 2016-02-09 DIAGNOSIS — F32A Depression, unspecified: Secondary | ICD-10-CM

## 2016-02-09 DIAGNOSIS — F411 Generalized anxiety disorder: Secondary | ICD-10-CM

## 2016-02-09 DIAGNOSIS — M1990 Unspecified osteoarthritis, unspecified site: Secondary | ICD-10-CM

## 2016-02-09 DIAGNOSIS — N952 Postmenopausal atrophic vaginitis: Secondary | ICD-10-CM

## 2016-02-09 DIAGNOSIS — R143 Flatulence: Secondary | ICD-10-CM

## 2016-02-09 DIAGNOSIS — G2581 Restless legs syndrome: Secondary | ICD-10-CM | POA: Diagnosis not present

## 2016-02-09 DIAGNOSIS — J309 Allergic rhinitis, unspecified: Secondary | ICD-10-CM

## 2016-02-09 DIAGNOSIS — I10 Essential (primary) hypertension: Secondary | ICD-10-CM

## 2016-02-09 DIAGNOSIS — F329 Major depressive disorder, single episode, unspecified: Secondary | ICD-10-CM

## 2016-02-09 DIAGNOSIS — K529 Noninfective gastroenteritis and colitis, unspecified: Secondary | ICD-10-CM | POA: Diagnosis not present

## 2016-02-09 DIAGNOSIS — E0590 Thyrotoxicosis, unspecified without thyrotoxic crisis or storm: Secondary | ICD-10-CM | POA: Diagnosis not present

## 2016-02-09 DIAGNOSIS — I2782 Chronic pulmonary embolism: Secondary | ICD-10-CM

## 2016-02-09 LAB — CBC AND DIFFERENTIAL
HEMATOCRIT: 32 % — AB (ref 36–46)
HEMOGLOBIN: 9.9 g/dL — AB (ref 12.0–16.0)
PLATELETS: 127 10*3/uL — AB (ref 150–399)
WBC: 6.3 10*3/mL

## 2016-02-09 NOTE — Progress Notes (Signed)
Patient ID: Katie Roseamela Evans, female   DOB: [birth year], 70 y.o.   MRN: [id number]   DATE:    02/09/16  Facility:  Nursing Home Location:  [facility] Room Number: 1005-2 LEVEL OF CARE:  SNF (31)   Chief Complaint  Patient presents with  . Medical Management of Chronic Issues    Pulmonary embolism, osteoarthritis, protein calorie malnutrition,  diastolic CHF, hyperthyroidism, anxiety, anemia, allergic rhinitis, hyperlipidemia, GERD,  hypertension, depression,  RLS, flatulence, psychosis and overactive bladder     HISTORY OF PRESENT ILLNESS:  This is a 70 year old female who is being seen for a routine visit. She was recently started on Premarin vaginal cream for vaginal atrophy. Torsemide was recently increased to BID due to BLE edema, 3+. Latest hgb 10.2 and continues to be on FeSO4 325 mg BID. 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 Coumadin was ordered to be stopped in preparation for the surgery.   PAST MEDICAL HISTORY:  Past Medical History  Diagnosis Date  . Renal disorder   . CHF (congestive heart failure) (HCC)   . Arthritis   . Hypertension   . Carpal tunnel syndrome   . Metabolic encephalopathy   . Anemia   . Major depressive disorder (HCC)   . Thrombocytopathia (HCC)   . Demand ischemia of myocardium (HCC)   . Chronic pulmonary embolism (HCC)   . Protein calorie malnutrition (HCC)   . Allergic rhinitis   . Hyperlipidemia   . Hyperthyroidism   . Generalized anxiety disorder   . GERD without esophagitis   . Hypokalemia   . Overactive bladder   . Psychosis   . RLS (restless legs syndrome)   . Chronic diarrhea   . Flatulence   . Bruising   . Long term current use of anticoagulant therapy   . Thrombocytopenia (HCC)     CURRENT MEDICATIONS: Reviewed per MAR/see medication list   Medication List       This list is accurate as of: 02/09/16 11:59 PM.  Always use your most recent med list.               ARTIFICIAL TEARS  OP  Apply to eye 3 (three) times daily as needed. Both eyes     aspirin 81 MG chewable tablet  Chew 81 mg by mouth daily.     clonazePAM 1 MG tablet  Commonly known as:  KLONOPIN  Take 1 mg by mouth at bedtime.     D3-1000 PO  Take 1 tablet by mouth daily.     DEVROM 200 MG Chew  Generic drug:  Bismuth Subgallate  Chew 1 tablet by mouth daily.     diclofenac sodium 1 % Gel  Commonly known as:  VOLTAREN  Apply 2 g topically 2 (two) times daily as needed. Apply to bilateral shoulders and knees BID PRN     diphenoxylate-atropine 2.5-0.025 MG tablet  Commonly known as:  LOMOTIL  Take by mouth. May take up to 6 per day as needed     estrogens (conjugated) 0.625 MG tablet  Commonly known as:  PREMARIN  Place 0.625 mg vaginally 2 (two) times a week.     famotidine 20 MG tablet  Commonly known as:  PEPCID  Take 20 mg by mouth daily.     FEROSUL PO  Take 325 mg by mouth 2 (two) times daily.     HYDROcodone-acetaminophen 5-325 MG tablet  Commonly known as:  NORCO/VICODIN  Take 1 tablet  by mouth 2 (two) times daily.     HYDROcodone-acetaminophen 5-325 MG tablet  Commonly known as:  NORCO/VICODIN  Take 1 tablet by mouth every 4 (four) hours as needed for moderate pain.     hydrocortisone 25 MG suppository  Commonly known as:  ANUSOL-HC  Place 25 mg rectally 2 (two) times daily as needed.     LIPITOR 40 MG tablet  Generic drug:  atorvastatin  Take 40 mg by mouth at bedtime.     loperamide 2 MG capsule  Commonly known as:  IMODIUM  Take 2 mg by mouth 4 (four) times daily as needed for diarrhea or loose stools.     loratadine 10 MG tablet  Commonly known as:  CLARITIN  Take 10 mg by mouth daily as needed.     methimazole 5 MG tablet  Commonly known as:  TAPAZOLE  Take 15 mg by mouth daily. Take three (3) tabs to equal 15 mg PO QD     nadolol 20 MG tablet  Commonly known as:  CORGARD  Take 20 mg by mouth daily.     nitroGLYCERIN 0.4 MG SL tablet  Commonly known  as:  NITROSTAT  Place 0.4 mg under the tongue every 5 (five) minutes as needed for chest pain. For 3 doses as needed for chest pain     nortriptyline 25 MG capsule  Commonly known as:  PAMELOR  Take 25 mg by mouth at bedtime.     oxybutynin 5 MG tablet  Commonly known as:  DITROPAN  Take 5 mg by mouth 3 (three) times daily as needed for bladder spasms.     polycarbophil 625 MG tablet  Commonly known as:  FIBERCON  Take 625 mg by mouth daily as needed.     potassium chloride SA 20 MEQ tablet  Commonly known as:  K-DUR,KLOR-CON  Take 20 mEq by mouth 2 (two) times daily. Take with food.  Do not crush.  May dissolve.     pramipexole 1.5 MG tablet  Commonly known as:  MIRAPEX  Take 1.5 mg by mouth 2 (two) times daily.     risperiDONE 0.25 MG tablet  Commonly known as:  RISPERDAL  Take 0.25 mg by mouth at bedtime.     SECURA EXTRA PROTECTIVE EX  Apply topically as needed. Apply to sacrum and buttocks     sertraline 100 MG tablet  Commonly known as:  ZOLOFT  Take 175 mg by mouth daily. Give 1 tablet with a 50 MG and a 25 MG tablet to = 175 mg po daily     torsemide 20 MG tablet  Commonly known as:  DEMADEX  Take 20 mg by mouth 2 (two) times daily.            Allergies  Allergen Reactions  . Remeron [Mirtazapine]      REVIEW OF SYSTEMS:  GENERAL: no change in appetite, no fatigue, no weight changes, no fever, chills or weakness RESPIRATORY: no cough, SOB, DOE, wheezing, hemoptysis CARDIAC: no chest pain,  or palpitations GI: no abdominal pain, heart burn  PHYSICAL EXAMINATION  GENERAL: no acute distress SKIN:  Skin is warm and dry. EYES: conjunctivae normal, sclerae normal, normal eye lids NECK: supple, trachea midline, no neck masses, no thyroid tenderness, no thyromegaly LYMPHATICS: no LAN in the neck, no supraclavicular LAN RESPIRATORY: breathing is even & unlabored, BS CTAB CARDIAC: RRR, no murmur,no extra heart sounds, BLE edema 3+ GI: abdomen soft,  normal BS, no masses, no tenderness, no  hepatomegaly, no splenomegaly EXTREMITIES: Able to move all 4 extremities; uses wheelchair to move around PSYCHIATRIC: the patient is alert & oriented to person, affect & behavior appropriate  LABS/RADIOLOGY: Labs reviewed:  Basic Metabolic Panel:  Recent Labs  16/10/96 1029  12/21/15 01/09/16 01/31/16  NA 135  < > 141 141 142  K 3.7  < > 4.2 3.5 3.5  CL 102  --   --   --   --   CO2 27  --   --   --   --   GLUCOSE 103*  --   --   --   --   BUN 26*  < > 31* 35* 44*  CREATININE 1.03*  < > 1.2* 1.2* 1.2*  CALCIUM 9.4  --   --   --   --   < > = values in this interval not displayed. Liver Function Tests:  Recent Labs  10/08/15 10/12/15  AST 16 25  ALT 11 23  ALKPHOS 72 212*   CBC:  Recent Labs  05/10/15 1029  10/12/15 01/23/16 01/31/16  WBC 7.2  < > 5.5 6.5 4.8  NEUTROABS 3.9  --   --   --   --   HGB 9.7*  < > 10.4* 10.8* 10.2*  HCT 30.5*  < > 35* 36 33*  MCV 87.1  --   --   --   --   PLT 157  < > 104* 137* 107*  < > = values in this interval not displayed.   06/24/15  total bilirubin 0.5 direct bilirubin 0.2 indirect bilirubin 0.3 SGOT 31 alkaline phosphatase 257 SGPT 19 and total protein 6.1 albumin 3.3 globulin 2.8 Dever isoenzyme 21 06/21/15  TSH 38.506 05/28/15  urine culture shows >= 100,000 colonies/ML Klebsiella pneumoniae 05/18/15  total T4 2 0.6 TSH <0.008 05/09/15  WBC 8.1 hemoglobin 10.2 hematocrit 32.3 MCV 87.3 platelets 177 sodium 138 potassium 3.6 glucose 91 BUN 27 creatinine 1.09 calcium 9.4 04/29/15  WBC 5.7 hemoglobin 8.2 hematocrit 25.5 MCV 85.6 platelet 98 sodium 140 potassium 3.3 glucose 108 BUN 25 creatinine 1.13 total bilirubin 0.5 alkaline phosphatase 117 SGOT 30 SGPT 22 total protein 4.4 albumin 2.3 calcium 8.9 cholesterol 71 triglycerides 42 HDL 47 LDL 16 TSH <0.008 ferritin 292 04/12/15  sodium 139 potassium 3.5 glucose 104 BUN 26 creatinine 0.76 calcium 9.4 04/07/15  WBC 4.8 hemoglobin 9.1 hematocrit 28.9 MCV  85.3 platelet 95 03/30/15  WBC 5.9 hemoglobin 8.7 hematocrit 27.1 MCV 82.4 platelets 72 03/29/15  sodium 142 potassium 2.9 glucose 90 BUN 24 creatinine 0.80 calcium 9.0  03/21/15  WBC 5.3 hemoglobin 8.9 hematocrit 27.3 MCV 81.5 platelet 94 sodium 139 potassium 2.9 glucose 86 BUN 19 creatinine 0.98 total bilirubin 0.8 alkaline phosphatase 120 SGOT 71 SGPT 38 total protein 4.3 albumin 9.2 02/03/15  WBC 7.5 hemoglobin 9.0 hematocrit 28.4 MCV 95.3 albumin 2.2   ASSESSMENT/PLAN:  Pulmonary embolism - no SOB;  Coumadin is stopped for now in preparation for surgery  Allergic rhinitis - continue Claritin 10 mg 1 tab PO daily PRN  Chronic diastolic CHF - recently increased  Demadex 20 mg to 1 tab by mouth BID ; weigh Q Mondays and Wednesdays; continue 1800 ml fluid restriction  Hypertension - well controlled; continue Nadolol 20 mg by mouth daily  Hyperlipidemia - continue Lipitor 40 mg by mouth daily at bedtime  Anxiety - mood is is stable; continue Klonopin 1 mg by mouth daily   Depression - mood is stable; continue Zoloft  175 mg by mouth daily and Pamelor 25 mg PO Q HS  Chronic diarrhea -hx of gastric bypass surgery; continue Imodium 2 mg by mouth 4 times a day when necessary  Hypokalemia -  K 3.5; continue KCl 20 MEQ  ER  by mouth twice a day  Anemia, iron deficiency -  continue ferrous sulfate 325 mg by mouth BID; hgb 9.9   Protein calorie malnutrition, severe -  continue supplementation  Overactive Bladder - continue Oxybutynin 5 mg 1 tab PO TID PRN  GERD - continue Pepcid 20 mg 1 tab PO Q D  Hyperthyroidism - continue Methimazole 5 mg take 3 tabs = 15 mg daily  RLS - stablecontinue Mirapex 1.5 mg BID  Psychosis - no agitation reported; continue Risperidone 0.25 mg Q HS  Flatulence - continue Devrom 200 mg daily  Osteoarthritis - continue Voltaren gel 1% 2 g topically to bilateral shoulders and bilateral knees twice a day when necessary and  Mobic on hold for now in preparation  for surgery; continue Norco 5/325 mg 1 tab BID; for 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

## 2016-02-17 LAB — CBC AND DIFFERENTIAL
HCT: 24 % — AB (ref 36–46)
Hemoglobin: 7.5 g/dL — AB (ref 12.0–16.0)
PLATELETS: 98 10*3/uL — AB (ref 150–399)
WBC: 13.2 10^3/mL

## 2016-02-17 LAB — BASIC METABOLIC PANEL
BUN: 25 mg/dL — AB (ref 4–21)
CREATININE: 1.1 mg/dL (ref 0.5–1.1)
GLUCOSE: 159 mg/dL
POTASSIUM: 3.5 mmol/L (ref 3.4–5.3)
SODIUM: 135 mmol/L — AB (ref 137–147)

## 2016-02-17 LAB — PROTIME-INR: Protime: 11.1 seconds (ref 10.0–13.8)

## 2016-02-18 LAB — CBC AND DIFFERENTIAL
HCT: 31 % — AB (ref 36–46)
HEMOGLOBIN: 9.8 g/dL — AB (ref 12.0–16.0)
Platelets: 101 10*3/uL — AB (ref 150–399)
WBC: 10.6 10^3/mL

## 2016-02-20 ENCOUNTER — Non-Acute Institutional Stay (SKILLED_NURSING_FACILITY): Payer: Medicare Other | Admitting: Adult Health

## 2016-02-20 ENCOUNTER — Encounter: Payer: Self-pay | Admitting: Adult Health

## 2016-02-20 DIAGNOSIS — E876 Hypokalemia: Secondary | ICD-10-CM | POA: Diagnosis not present

## 2016-02-20 DIAGNOSIS — E785 Hyperlipidemia, unspecified: Secondary | ICD-10-CM | POA: Diagnosis not present

## 2016-02-20 DIAGNOSIS — M170 Bilateral primary osteoarthritis of knee: Secondary | ICD-10-CM | POA: Diagnosis not present

## 2016-02-20 DIAGNOSIS — F419 Anxiety disorder, unspecified: Secondary | ICD-10-CM

## 2016-02-20 DIAGNOSIS — N3281 Overactive bladder: Secondary | ICD-10-CM

## 2016-02-20 DIAGNOSIS — Z7901 Long term (current) use of anticoagulants: Secondary | ICD-10-CM | POA: Diagnosis not present

## 2016-02-20 DIAGNOSIS — F32A Depression, unspecified: Secondary | ICD-10-CM

## 2016-02-20 DIAGNOSIS — D62 Acute posthemorrhagic anemia: Secondary | ICD-10-CM | POA: Diagnosis not present

## 2016-02-20 DIAGNOSIS — J309 Allergic rhinitis, unspecified: Secondary | ICD-10-CM | POA: Diagnosis not present

## 2016-02-20 DIAGNOSIS — R143 Flatulence: Secondary | ICD-10-CM

## 2016-02-20 DIAGNOSIS — Z86711 Personal history of pulmonary embolism: Secondary | ICD-10-CM | POA: Diagnosis not present

## 2016-02-20 DIAGNOSIS — F329 Major depressive disorder, single episode, unspecified: Secondary | ICD-10-CM

## 2016-02-20 DIAGNOSIS — I5032 Chronic diastolic (congestive) heart failure: Secondary | ICD-10-CM

## 2016-02-20 DIAGNOSIS — K529 Noninfective gastroenteritis and colitis, unspecified: Secondary | ICD-10-CM

## 2016-02-20 DIAGNOSIS — E0590 Thyrotoxicosis, unspecified without thyrotoxic crisis or storm: Secondary | ICD-10-CM

## 2016-02-20 DIAGNOSIS — K219 Gastro-esophageal reflux disease without esophagitis: Secondary | ICD-10-CM

## 2016-02-20 DIAGNOSIS — I10 Essential (primary) hypertension: Secondary | ICD-10-CM | POA: Diagnosis not present

## 2016-02-20 DIAGNOSIS — N952 Postmenopausal atrophic vaginitis: Secondary | ICD-10-CM

## 2016-02-20 DIAGNOSIS — G43909 Migraine, unspecified, not intractable, without status migrainosus: Secondary | ICD-10-CM

## 2016-02-20 NOTE — Progress Notes (Signed)
Patient ID: Katie Evans, female   DOB: 1946/03/27, 70 y.o.   MRN: [id number]    DATE:   02/20/16  MRN:  [id number]  BIRTHDAY: [birth year]  Facility:  Nursing Home Location:  [facility]  Nursing Home Room Number: 1005-2  LEVEL OF CARE:  SNF [phone])  Contact Information    Name Relation Home Work [place] Sister [phone]   [place] Plan   [phone]       Code Status History    This patient does not have a recorded code status. Please follow your organizational policy for patients in this situation.       Chief Complaint  Patient presents with  . Hospitalization Follow-up    HISTORY OF PRESENT ILLNESS:  This is a 70 year old female who has been re-admitted to [facility] today, 02/20/16 from [facility]. She has PMH of arthritis, carpal tunnel syndrome, CHF, hemorrhoids and hypertension. She has osteoarthritis of bilateral knees for which she had bilateral total knee arthroplasties on 02/14/16.  She has been admitted for long-term care.  PAST MEDICAL HISTORY:  Past Medical History  Diagnosis Date  . Renal disorder   . CHF (congestive heart failure) (HCC)   . Arthritis   . Hypertension   . Carpal tunnel syndrome   . Metabolic encephalopathy   . Anemia   . Major depressive disorder (HCC)   . Thrombocytopathia (HCC)   . Demand ischemia of myocardium (HCC)   . Chronic pulmonary embolism (HCC)   . Protein calorie malnutrition (HCC)   . Allergic rhinitis   . Hyperlipidemia   . Hyperthyroidism   . Generalized anxiety disorder   . GERD without esophagitis   . Hypokalemia   . Overactive bladder   . Psychosis   . RLS (restless legs syndrome)   . Chronic diarrhea   . Flatulence   . Bruising   . Long term current use of anticoagulant therapy   . Thrombocytopenia (HCC)      CURRENT MEDICATIONS: Reviewed  Patient's Medications  New Prescriptions   No medications on file  Previous  Medications   ASPIRIN 81 MG CHEWABLE TABLET    Chew 81 mg by mouth daily.    ATORVASTATIN (LIPITOR) 40 MG TABLET    Take 40 mg by mouth at bedtime.   BISMUTH SUBGALLATE (DEVROM) 200 MG CHEW    Chew 1 tablet by mouth daily.    CHOLECALCIFEROL (D3-1000 PO)    Take 1 tablet by mouth daily.   CLONAZEPAM (KLONOPIN) 0.5 MG TABLET    Take 0.5 mg by mouth daily.   DICLOFENAC SODIUM (VOLTAREN) 1 % GEL    Apply 2 g topically 2 (two) times daily as needed. Apply to bilateral shoulders and knees BID PRN   ESTROGENS, CONJUGATED, (PREMARIN) 0.625 MG TABLET    Place 0.625 mg vaginally 2 (two) times a week. Apply one-half gm intravaginally 2x weekly.   FAMOTIDINE (PEPCID) 20 MG TABLET    Take 20 mg by mouth daily.    FERROUS SULFATE (FEROSUL PO)    Take 325 mg by mouth 2 (two) times daily.    HYPROMELLOSE (ARTIFICIAL TEARS OP)    Apply to eye 3 (three) times daily as needed. Both eyes   LOPERAMIDE (IMODIUM) 2 MG CAPSULE    Take 2 mg by mouth 4 (four) times daily as needed for diarrhea or loose stools.    LORATADINE (CLARITIN) 10 MG TABLET    Take 10 mg by mouth  daily as needed.    METHIMAZOLE (TAPAZOLE) 5 MG TABLET    Take 15 mg by mouth daily. Take three (3) tabs to equal 15 mg PO QD   MULTIPLE VITAMINS-MINERALS (DECUBI-VITE) CAPS    Take 1 capsule by mouth daily.   NADOLOL (CORGARD) 40 MG TABLET    Take 40 mg by mouth daily.   NITROGLYCERIN (NITROSTAT) 0.4 MG SL TABLET    Place 0.4 mg under the tongue every 5 (five) minutes as needed for chest pain. For 3 doses as needed for chest pain   NORTRIPTYLINE (PAMELOR) 25 MG CAPSULE    Take 25 mg by mouth at bedtime.   OXYBUTYNIN (DITROPAN) 5 MG TABLET    Take 5 mg by mouth 3 (three) times daily as needed for bladder spasms.   OXYCODONE (OXY-IR) 5 MG CAPSULE    Take 5-10 mg by mouth every 4 (four) hours as needed. Use 5 mg for pain rated 2-5/10 and 10 mg for pain rated 6 or greater/10.   POLYCARBOPHIL (FIBERCON) 625 MG TABLET    Take 625 mg by mouth daily.     POTASSIUM CHLORIDE SA (K-DUR,KLOR-CON) 20 MEQ TABLET    Take 20 mEq by mouth 2 (two) times daily. Take with food.  Do not crush.  May dissolve.   PRAMIPEXOLE (MIRAPEX) 1.5 MG TABLET    Take 1.5 mg by mouth 2 (two) times daily.    RISPERIDONE (RISPERDAL) 0.25 MG TABLET    Take 0.25 mg by mouth at bedtime.   SERTRALINE (ZOLOFT) 100 MG TABLET    Take 100 mg by mouth daily.    TIZANIDINE (ZANAFLEX) 4 MG TABLET    Take 4 mg by mouth every 6 (six) hours as needed for muscle spasms.   TOPIRAMATE (TOPAMAX) 25 MG TABLET    Take 25 mg by mouth daily.   TORSEMIDE (DEMADEX) 20 MG TABLET    Take 20 mg by mouth daily.    WARFARIN (COUMADIN) 5 MG TABLET    Take 5 mg by mouth daily. INR today before starting Coumadin.  Take 5 mg today, 4.5 mg on T-W-Th.  Repeat INR Friday, 02/24/16.   ZINC OXIDE (SECURA EXTRA PROTECTIVE EX)    Apply topically as needed. Apply to sacrum and buttocks  Modified Medications   No medications on file  Discontinued Medications   CLONAZEPAM (KLONOPIN) 1 MG TABLET    Take 1 mg by mouth at bedtime.   DIPHENOXYLATE-ATROPINE (LOMOTIL) 2.5-0.025 MG TABLET    Take by mouth. May take up to 6 per day as needed   HYDROCODONE-ACETAMINOPHEN (NORCO/VICODIN) 5-325 MG TABLET    Take 1 tablet by mouth 2 (two) times daily.    HYDROCODONE-ACETAMINOPHEN (NORCO/VICODIN) 5-325 MG TABLET    Take 1 tablet by mouth every 4 (four) hours as needed for moderate pain.   HYDROCORTISONE (ANUSOL-HC) 25 MG SUPPOSITORY    Place 25 mg rectally 2 (two) times daily as needed.    NADOLOL (CORGARD) 20 MG TABLET    Take 20 mg by mouth daily. Reported on 02/20/2016     Allergies  Allergen Reactions  . Remeron [Mirtazapine]      REVIEW OF SYSTEMS:  GENERAL: no change in appetite, no fatigue, no weight changes, no fever, chills or weakness EYES: Denies change in vision, dry eyes, eye pain, itching or discharge EARS: Denies change in hearing, ringing in ears, or earache NOSE: Denies nasal congestion or  epistaxis MOUTH and THROAT: Denies oral discomfort, gingival pain or bleeding, pain from  teeth or hoarseness   RESPIRATORY: no cough, SOB, DOE, wheezing, hemoptysis CARDIAC: no chest pain, or palpitations GI: no abdominal pain, diarrhea, constipation, heart burn, nausea or vomiting GU: Denies dysuria, frequency, hematuria, incontinence, or discharge PSYCHIATRIC: Denies feeling of depression or anxiety. No report of hallucinations, insomnia, paranoia, or agitation   PHYSICAL EXAMINATION  GENERAL APPEARANCE: Well nourished. In no acute distress. Obese SKIN:  Bilateral knee surgical incision covered with aquacel dressings, dry, no erythema on the sides HEAD: Normal in size and contour. No evidence of trauma EYES: Lids open and close normally. No blepharitis, entropion or ectropion. PERRL. Conjunctivae are clear and sclerae are white. Lenses are without opacity EARS: Pinnae are normal. Patient hears normal voice tunes of the examiner MOUTH and THROAT: Lips are without lesions. Oral mucosa is moist and without lesions. Tongue is normal in shape, size, and color and without lesions NECK: supple, trachea midline, no neck masses, no thyroid tenderness, no thyromegaly LYMPHATICS: no LAN in the neck, no supraclavicular LAN RESPIRATORY: breathing is even & unlabored, BS CTAB CARDIAC: RRR, no murmur,no extra heart sounds GI: abdomen soft, normal BS, no masses, no tenderness, no hepatomegaly, no splenomegaly EXTREMITIES:  Able to move X 4 extremities PSYCHIATRIC: Alert and oriented X 3. Affect and behavior are appropriate  LABS/RADIOLOGY: Labs reviewed: Basic Metabolic Panel:  Recent Labs  16/08/9605/14/16 1029  01/09/16 01/31/16 02/17/16  NA 135  < > 141 142 135*  K 3.7  < > 3.5 3.5 3.5  CL 102  --   --   --   --   CO2 27  --   --   --   --   GLUCOSE 103*  --   --   --   --   BUN 26*  < > 35* 44* 25*  CREATININE 1.03*  < > 1.2* 1.2* 1.1  CALCIUM 9.4  --   --   --   --   < > = values in this  interval not displayed. Liver Function Tests:  Recent Labs  10/08/15 10/12/15  AST 16 25  ALT 11 23  ALKPHOS 72 212*   CBC:  Recent Labs  05/10/15 1029  02/09/16 02/17/16 02/18/16  WBC 7.2  < > 6.3 13.2 10.6  NEUTROABS 3.9  --   --   --   --   HGB 9.7*  < > 9.9* 7.5* 9.8*  HCT 30.5*  < > 32* 24* 31*  MCV 87.1  --   --   --   --   PLT 157  < > 127* 98* 101*  < > = values in this interval not displayed.   ASSESSMENT/PLAN:  Osteoarthritis S/P total bilateral knee replacement - for rehabilitation; continue oxycodone 5 mg 1-2 tabs by mouth every 4 hours when necessary for pain; Zanaflex 4 mg 1 tab by mouth every 6 hours when necessary for muscle spasm; Coumadin for DVT prophylaxis; BLE WBAT;  follow-up with orthopedics, Dr. Denyce RobertStephen Geoffrey Pill, on 02/27/16  Hx of pulmonary embolism - no SOB; continue Coumadin   Long-term use of anticoagulant - INR 1.5, subtherapeutic; give Coumadin 5 mg today then 4.5 mg PO Q D; INR on 02/24/16  Allergic rhinitis - continue Claritin 10 mg 1 tab PO daily PRN  Chronic diastolic CHF - recently increased Demadex 20 mg to 1 tab by mouth BID ; weigh Q Mondays and Wednesdays; continue 1800 ml fluid restriction  Hypertension - well controlled; continue Nadolol 40 mg by mouth daily  Hyperlipidemia - continue Lipitor 40 mg by mouth daily at bedtime  Anxiety - mood is is stable; continue Klonopin 0.5 mg by mouth Q HS and 0.5 mg Q D PRN   Depression - mood is stable; continue Zoloft 175 mg by mouth daily and Pamelor 25 mg PO Q HS  Chronic diarrhea -hx of gastric bypass surgery; continue Imodium 2 mg by mouth 4 times a day when necessary  Hypokalemia - K 4.1 ; continue KCl 20 MEQ ER by mouth twice a day; check CMP  Anemia, acute blood loss  - hgb 9.8 continue ferrous sulfate 325 mg by mouth BID, check CBC  Overactive Bladder - continue Oxybutynin 5 mg 1 tab PO TID PRN  GERD - continue Pepcid 20 mg 1 tab PO Q D  Hyperthyroidism - continue  Methimazole 5 mg take 3 tabs = 15 mg daily, check tsh  RLS - stablecontinue Mirapex 1.5 mg BID  Psychosis - continue Risperidone 0.25 mg Q HS   Flatulence - continue Devrom 200 mg daily  Migraine headache - continue Topamax 25 mg 1 tab daily  Vaginal atrophy - Premarin 0.625 mg/gm apply 0.5 gm intravaginally 2X/week    Goals of care:  Long-term care    [facility], NP [facility] [phone]

## 2016-02-21 ENCOUNTER — Non-Acute Institutional Stay (SKILLED_NURSING_FACILITY): Payer: Medicare Other | Admitting: Internal Medicine

## 2016-02-21 ENCOUNTER — Encounter: Payer: Self-pay | Admitting: Internal Medicine

## 2016-02-21 DIAGNOSIS — G2581 Restless legs syndrome: Secondary | ICD-10-CM | POA: Diagnosis not present

## 2016-02-21 DIAGNOSIS — R748 Abnormal levels of other serum enzymes: Secondary | ICD-10-CM | POA: Diagnosis not present

## 2016-02-21 DIAGNOSIS — E785 Hyperlipidemia, unspecified: Secondary | ICD-10-CM

## 2016-02-21 DIAGNOSIS — E0590 Thyrotoxicosis, unspecified without thyrotoxic crisis or storm: Secondary | ICD-10-CM

## 2016-02-21 DIAGNOSIS — R5381 Other malaise: Secondary | ICD-10-CM | POA: Diagnosis not present

## 2016-02-21 DIAGNOSIS — I25119 Atherosclerotic heart disease of native coronary artery with unspecified angina pectoris: Secondary | ICD-10-CM

## 2016-02-21 DIAGNOSIS — F411 Generalized anxiety disorder: Secondary | ICD-10-CM | POA: Diagnosis not present

## 2016-02-21 DIAGNOSIS — F0634 Mood disorder due to known physiological condition with mixed features: Secondary | ICD-10-CM | POA: Diagnosis not present

## 2016-02-21 DIAGNOSIS — D62 Acute posthemorrhagic anemia: Secondary | ICD-10-CM

## 2016-02-21 DIAGNOSIS — M170 Bilateral primary osteoarthritis of knee: Secondary | ICD-10-CM

## 2016-02-21 DIAGNOSIS — K219 Gastro-esophageal reflux disease without esophagitis: Secondary | ICD-10-CM

## 2016-02-21 NOTE — Progress Notes (Signed)
LOCATION: [facility]  PCP: Vivien Presto, MD   Code Status: DNR  Goals of care: Advanced Directive information No flowsheet data found.    Extended Emergency Contact Information Primary Emergency Contact: Grast,Lauran Address: [address]           [place], [place] [address] [place] Home Phone: [phone] Work Phone: [phone] Mobile Phone: [phone] Relation: Sister Secondary Emergency Contact: Pearson [place] Mobile Phone: [phone] Relation: Sister   Allergies  Allergen Reactions  . Remeron [Mirtazapine]     Chief Complaint  Patient presents with  . Readmit To SNF    Readmission     HPI:  Patient is a 70 y.o. female seen today for long term care post hospital admission from 02/14/16-02/20/16 for bilateral knee OA. She underwent bilateral total knee arthroplasty. She is seen in her room today. She complaints of being in a lot of pain. She mentions being on norco before and that helping her pain better.   Review of Systems:  Constitutional: Negative for fever, chills, diaphoresis.  HENT: Negative for headache, congestion, nasal discharge, hearing loss, sore throat, difficulty swallowing.   Eyes: Negative for blurred vision, double vision and discharge.  Respiratory: Negative for cough, shortness of breath and wheezing.   Cardiovascular: Negative for chest pain, palpitations. Positive for leg swelling.  Gastrointestinal: Negative for heartburn, nausea, vomiting, abdominal pain. Positive for poor appetite. Last bowel movement was yesterday.  Genitourinary: Negative for dysuria and flank pain.  Musculoskeletal: Negative for back pain, fall.  Skin: Negative for itching, rash.  Neurological: Negative for weakness. Positive for dizziness. Psychiatric/Behavioral: Positive for depression and anxiety   Past Medical History  Diagnosis Date  . Renal disorder   . CHF (congestive heart failure) (HCC)   . Arthritis    . Hypertension   . Carpal tunnel syndrome   . Metabolic encephalopathy   . Anemia   . Major depressive disorder (HCC)   . Thrombocytopathia (HCC)   . Demand ischemia of myocardium (HCC)   . Chronic pulmonary embolism (HCC)   . Protein calorie malnutrition (HCC)   . Allergic rhinitis   . Hyperlipidemia   . Hyperthyroidism   . Generalized anxiety disorder   . GERD without esophagitis   . Hypokalemia   . Overactive bladder   . Psychosis   . RLS (restless legs syndrome)   . Chronic diarrhea   . Flatulence   . Bruising   . Long term current use of anticoagulant therapy   . Thrombocytopenia [facility])    Past Surgical History  Procedure Laterality Date  . Tonsillectomy    . Appendectomy    . Back surgery    . Gastric bypass    . Shoulder surgery    . Abdominal hysterectomy    . Spine surgery      spinal fusion   Social History:   reports that she has never smoked. She has never used smokeless tobacco. She reports that she does not drink alcohol or use illicit drugs.  No family history on file.  Medications:   Medication List       This list is accurate as of: 02/21/16  4:40 PM.  Always use your most recent med list.               ARTIFICIAL TEARS OP  Apply to eye 3 (three) times daily as needed. Both eyes     aspirin 81 MG chewable tablet  Chew 81 mg by  mouth daily.     clonazePAM 0.5 MG tablet  Commonly known as:  KLONOPIN  Take 0.5 mg by mouth daily as needed.     D3-1000 PO  Take 1 tablet by mouth daily.     DECUBI-VITE Caps  Take 1 capsule by mouth daily.     DEVROM 200 MG Chew  Generic drug:  Bismuth Subgallate  Chew 1 tablet by mouth daily.     diclofenac sodium 1 % Gel  Commonly known as:  VOLTAREN  Apply 2 g topically 2 (two) times daily as needed. Apply to bilateral shoulders and knees BID PRN     estrogens (conjugated) 0.625 MG tablet  Commonly known as:  PREMARIN  Place 0.625 mg vaginally 2 (two) times a week. Apply one-half gm  intravaginally 2x weekly.     famotidine 20 MG tablet  Commonly known as:  PEPCID  Take 20 mg by mouth daily.     FEROSUL PO  Take 325 mg by mouth 2 (two) times daily.     LIPITOR 40 MG tablet  Generic drug:  atorvastatin  Take 40 mg by mouth at bedtime.     loperamide 2 MG capsule  Commonly known as:  IMODIUM  Take 2 mg by mouth 4 (four) times daily as needed for diarrhea or loose stools.     loratadine 10 MG tablet  Commonly known as:  CLARITIN  Take 10 mg by mouth daily as needed.     methimazole 5 MG tablet  Commonly known as:  TAPAZOLE  Take 15 mg by mouth daily. Take three (3) tabs to equal 15 mg PO QD     nadolol 40 MG tablet  Commonly known as:  CORGARD  Take 40 mg by mouth daily.     nitroGLYCERIN 0.4 MG SL tablet  Commonly known as:  NITROSTAT  Place 0.4 mg under the tongue every 5 (five) minutes as needed for chest pain. For 3 doses as needed for chest pain     nortriptyline 25 MG capsule  Commonly known as:  PAMELOR  Take 25 mg by mouth at bedtime.     oxybutynin 5 MG tablet  Commonly known as:  DITROPAN  Take 5 mg by mouth 3 (three) times daily as needed for bladder spasms.     oxycodone 5 MG capsule  Commonly known as:  OXY-IR  Take 5-10 mg by mouth every 4 (four) hours as needed. Use 5 mg for pain rated 2-5/10 and 10 mg for pain rated 6 or greater/10.     polycarbophil 625 MG tablet  Commonly known as:  FIBERCON  Take 625 mg by mouth daily as needed (chronic diarrhea).     potassium chloride SA 20 MEQ tablet  Commonly known as:  K-DUR,KLOR-CON  Take 20 mEq by mouth 2 (two) times daily. Take with food.  Do not crush.  May dissolve.     pramipexole 1.5 MG tablet  Commonly known as:  MIRAPEX  Take 1.5 mg by mouth 2 (two) times daily.     risperiDONE 0.25 MG tablet  Commonly known as:  RISPERDAL  Take 0.25 mg by mouth at bedtime.     SECURA EXTRA PROTECTIVE EX  Apply topically as needed. Apply to sacrum and buttocks     sertraline 100 MG  tablet  Commonly known as:  ZOLOFT  Take 100 mg by mouth daily. Give tablet with a 50 mg and 25 mg tab to= 175 mg by mouth daily  sertraline 25 MG tablet  Commonly known as:  ZOLOFT  Take 25 mg by mouth daily. Give 50 mg and 100 mg tabs to = 175 mg by mouth daily     sertraline 50 MG tablet  Commonly known as:  ZOLOFT  Take 50 mg by mouth daily. Give 1 tablet with a 100 mg and 25 mg tabs to = 175 by mouth daily     tiZANidine 4 MG tablet  Commonly known as:  ZANAFLEX  Take 4 mg by mouth every 6 (six) hours as needed for muscle spasms.     topiramate 25 MG tablet  Commonly known as:  TOPAMAX  Take 25 mg by mouth daily.     torsemide 20 MG tablet  Commonly known as:  DEMADEX  Take 20 mg by mouth 2 (two) times daily.     warfarin 5 MG tablet  Commonly known as:  COUMADIN  Take 5 mg by mouth daily. INR today before starting Coumadin.  Take 5 mg today, 4.5 mg on T-W-Th.  Repeat INR Friday, 02/24/16.        Immunizations: Immunization History  Administered Date(s) Administered  . PPD Test 12/03/2014     Physical Exam: Filed Vitals:   02/21/16 1622  BP: 102/64  Pulse: 71  Temp: 98.2 F (36.8 C)  TempSrc: Oral  Resp: 16  Height:  (1.575 m)  Weight: 199 lb 9.6 oz (90.538 kg)  SpO2: 96%   Body mass index is 36.5 kg/(m^2).  General- elderly female, obese, in no acute distress Head- normocephalic, atraumatic Nose- no maxillary or frontal sinus tenderness, no nasal discharge Throat- moist mucus membrane  Eyes- PERRLA, EOMI, no pallor, no icterus, no discharge, normal conjunctiva, normal sclera Neck- no cervical lymphadenopathy Cardiovascular- normal s1,s2, no murmur, leg edema present Respiratory- bilateral clear to auscultation, no wheeze, no rhonchi, no crackles, no use of accessory muscles Abdomen- bowel sounds present, soft, non tender Musculoskeletal- able to move all 4 extremities, limited ROM with both knees  Neurological- alert and oriented to person,  place and time but poor attention span  Skin- warm and dry, aquacel dressing to both her knees, mild redness to right knee Psychiatry- normal mood and affect    Labs reviewed: Basic Metabolic Panel:  Recent Labs  40/98/11 1029  01/09/16 01/31/16 02/17/16  NA 135  < > 141 142 135*  K 3.7  < > 3.5 3.5 3.5  CL 102  --   --   --   --   CO2 27  --   --   --   --   GLUCOSE 103*  --   --   --   --   BUN 26*  < > 35* 44* 25*  CREATININE 1.03*  < > 1.2* 1.2* 1.1  CALCIUM 9.4  --   --   --   --   < > = values in this interval not displayed. Liver Function Tests:  Recent Labs  10/08/15 10/12/15  AST 16 25  ALT 11 23  ALKPHOS 72 212*   No results for input(s): LIPASE, AMYLASE in the last 8760 hours. No results for input(s): AMMONIA in the last 8760 hours. CBC:  Recent Labs  05/10/15 1029  02/09/16 02/17/16 02/18/16  WBC 7.2  < > 6.3 13.2 10.6  NEUTROABS 3.9  --   --   --   --   HGB 9.7*  < > 9.9* 7.5* 9.8*  HCT 30.5*  < > 32* 24*  31*  MCV 87.1  --   --   --   --   PLT 157  < > 127* 98* 101*  < > = values in this interval not displayed.    Assessment/Plan  Physical deconditioning Will have patient work with PT/OT as tolerated to regain strength and restore function.  Fall precautions are in place.  Bilateral knee OA S/p bilateral total knee arthroplasty. Discontinue oxyIR. Start norco 5-325 mg 1-2 tab q4h prn pain. get physiatry consult. Will have her work with physical therapy and occupational therapy team to help with gait training and muscle strengthening exercises.fall precautions. Skin care. Encourage to be out of bed. WBAT. Continue zanaflex 4 mg q6h prn muscle spasm. Continue coumadin for dvt prophylaxis. Add ted hose to help with leg edema  Blood loss anemia Post op, monitor cbc. Continue iron supplement  Elevated ALP  Monitor lft. Denies abdominal pain.   Anxiety state Monitor clinically, continue klonopin 0.5 mg daily as needed  gerd Stable, continue  famotidine 20 mg daily  HLD Continue lipitor  Hyperthyroidism Continue methimazole, no changes made  CAD Remains chest pain free. Continue nadolol, aspirin, statin and prn NTG  Chronic depression  Continue nortriptyline, risperidone, zoloft, topamax and monitor  RLS Continue mirapex for now  History of PE Stable breathing, continue coumadin with goal inr 2-3    Goals of care: short term rehabilitation but possible LTC   Labs/tests ordered: cbc, cmp, tsh  Family/ staff Communication: reviewed care plan with patient and nursing supervisor    Oneal GroutMAHIMA Cartina Brousseau, MD Internal Medicine [facility] [address] [place], [place] [address] Cell Phone (Monday-Friday 8 am - 5 pm): [phone] On Call: [phone] and follow prompts after 5 pm and on weekends Office Phone: [phone] Office Fax: [phone]

## 2016-02-22 LAB — CBC AND DIFFERENTIAL
HEMATOCRIT: 28 % — AB (ref 36–46)
HEMOGLOBIN: 8.9 g/dL — AB (ref 12.0–16.0)
Platelets: 147 10*3/uL — AB (ref 150–399)
WBC: 8.9 10^3/mL

## 2016-02-22 LAB — BASIC METABOLIC PANEL
BUN: 32 mg/dL — AB (ref 4–21)
Creatinine: 1.3 mg/dL — AB (ref 0.5–1.1)
GLUCOSE: 125 mg/dL
Potassium: 3.8 mmol/L (ref 3.4–5.3)
SODIUM: 136 mmol/L — AB (ref 137–147)

## 2016-02-22 LAB — HEPATIC FUNCTION PANEL
ALT: 11 U/L (ref 7–35)
AST: 18 U/L (ref 13–35)
Alkaline Phosphatase: 138 U/L — AB (ref 25–125)
Bilirubin, Total: 1.2 mg/dL

## 2016-02-22 LAB — LIPID PANEL
CHOLESTEROL: 70 mg/dL (ref 0–200)
HDL: 29 mg/dL — AB (ref 35–70)
LDL CALC: 27 mg/dL
TRIGLYCERIDES: 72 mg/dL (ref 40–160)

## 2016-02-22 LAB — TSH: TSH: 0.02 u[IU]/mL — AB (ref 0.41–5.90)

## 2016-02-24 ENCOUNTER — Non-Acute Institutional Stay (SKILLED_NURSING_FACILITY): Payer: Medicare Other | Admitting: Adult Health

## 2016-02-24 ENCOUNTER — Encounter: Payer: Self-pay | Admitting: Adult Health

## 2016-02-24 DIAGNOSIS — Z7901 Long term (current) use of anticoagulants: Secondary | ICD-10-CM

## 2016-02-24 DIAGNOSIS — R400 Somnolence: Secondary | ICD-10-CM

## 2016-02-24 LAB — HEPATIC FUNCTION PANEL
ALK PHOS: 145 U/L — AB (ref 25–125)
ALT: 11 U/L (ref 7–35)
AST: 16 U/L (ref 13–35)
Bilirubin, Total: 0.8 mg/dL

## 2016-02-24 LAB — CBC AND DIFFERENTIAL
HEMATOCRIT: 28 % — AB (ref 36–46)
HEMOGLOBIN: 8.6 g/dL — AB (ref 12.0–16.0)
Neutrophils Absolute: 5 /uL
PLATELETS: 200 10*3/uL (ref 150–399)
WBC: 7.9 10*3/mL

## 2016-02-24 LAB — BASIC METABOLIC PANEL
BUN: 35 mg/dL — AB (ref 4–21)
CREATININE: 1.3 mg/dL — AB (ref 0.5–1.1)
Glucose: 155 mg/dL
Potassium: 3.7 mmol/L (ref 3.4–5.3)
Sodium: 139 mmol/L (ref 137–147)

## 2016-02-24 NOTE — Progress Notes (Signed)
Patient ID: Simrah Chatham, female   DOB: 1946-09-11, 70 y.o.   MRN: [id number]    DATE:   02/24/16  MRN:  [id number]  BIRTHDAY: 1946/10/16  Facility:  Nursing Home Location:  [facility]  Nursing Home Room Number: 1005-2  LEVEL OF CARE:  SNF [phone]  Contact Information    Name Relation Home Work [place] Sister [phone]   Fuller Plan   [phone]       Code Status History    This patient does not have a recorded code status. Please follow your organizational policy for patients in this situation.       Chief Complaint  Patient presents with  . Acute Visit    Medication management, Coumadin therapy    HISTORY OF PRESENT ILLNESS:  This is a 70 year old female who has been noted to be sleepy. She is on multiple medications that can possibly may her sleepy - Zanaflex, Pamelor, Risperdal, Zoloft, Mirapex and Topamax. She had a recent bilateral total knee arthroplasties and currently having short-term rehabilitation. Latest INR  3.6, supratherapeutic. No signs of bleeding.   PAST MEDICAL HISTORY:  Past Medical History  Diagnosis Date  . Renal disorder   . CHF (congestive heart failure) (HCC)   . Arthritis   . Hypertension   . Carpal tunnel syndrome   . Metabolic encephalopathy   . Anemia   . Major depressive disorder (HCC)   . Thrombocytopathia (HCC)   . Demand ischemia of myocardium (HCC)   . Chronic pulmonary embolism (HCC)   . Protein calorie malnutrition (HCC)   . Allergic rhinitis   . Hyperlipidemia   . Hyperthyroidism   . Generalized anxiety disorder   . GERD without esophagitis   . Hypokalemia   . Overactive bladder   . Psychosis   . RLS (restless legs syndrome)   . Chronic diarrhea   . Flatulence   . Bruising   . Long term current use of anticoagulant therapy   . Thrombocytopenia (HCC)      CURRENT MEDICATIONS: Reviewed  Patient's Medications  New Prescriptions   No medications  on file  Previous Medications   ASPIRIN 81 MG CHEWABLE TABLET    Chew 81 mg by mouth daily.    ATORVASTATIN (LIPITOR) 40 MG TABLET    Take 40 mg by mouth at bedtime.   BISMUTH SUBGALLATE (DEVROM) 200 MG CHEW    Chew 1 tablet by mouth daily.    CHOLECALCIFEROL (D3-1000 PO)    Take 1 tablet by mouth daily.   CLONAZEPAM (KLONOPIN) 0.5 MG TABLET    Take 0.5 mg by mouth daily. Take 1 tablet by mouth at bedtime and 1 tablet QD PRN for anxiety/restlessness   DICLOFENAC SODIUM (VOLTAREN) 1 % GEL    Apply 2 g topically 2 (two) times daily as needed. Apply to bilateral shoulders and knees BID PRN   ESTROGENS, CONJUGATED, (PREMARIN) 0.625 MG TABLET    Place 0.625 mg vaginally 2 (two) times a week. Apply one-half gm intravaginally 2x weekly.   FAMOTIDINE (PEPCID) 20 MG TABLET    Take 20 mg by mouth daily.    FERROUS SULFATE (FEROSUL PO)    Take 325 mg by mouth 2 (two) times daily.    HYDROCODONE-ACETAMINOPHEN (NORCO/VICODIN) 5-325 MG TABLET    Take 1-2 tablets by mouth every 4 (four) hours as needed for moderate pain or severe pain.   HYPROMELLOSE (ARTIFICIAL TEARS OP)    Apply to eye 3 (three)  times daily as needed. Both eyes   LOPERAMIDE (IMODIUM) 2 MG CAPSULE    Take 2 mg by mouth 4 (four) times daily as needed for diarrhea or loose stools.    LORATADINE (CLARITIN) 10 MG TABLET    Take 10 mg by mouth daily as needed.    METHIMAZOLE (TAPAZOLE) 5 MG TABLET    Take 15 mg by mouth daily. Take three (3) tabs to equal 15 mg PO QD   MULTIPLE VITAMINS-MINERALS (DECUBI-VITE) CAPS    Take 1 capsule by mouth daily.   NADOLOL (CORGARD) 40 MG TABLET    Take 40 mg by mouth daily.   NITROGLYCERIN (NITROSTAT) 0.4 MG SL TABLET    Place 0.4 mg under the tongue every 5 (five) minutes as needed for chest pain. For 3 doses as needed for chest pain   NORTRIPTYLINE (PAMELOR) 25 MG CAPSULE    Take 25 mg by mouth at bedtime.   OXYBUTYNIN (DITROPAN) 5 MG TABLET    Take 5 mg by mouth 3 (three) times daily as needed for bladder  spasms.   POLYCARBOPHIL (FIBERCON) 625 MG TABLET    Take 625 mg by mouth daily as needed (chronic diarrhea).    POTASSIUM CHLORIDE SA (K-DUR,KLOR-CON) 20 MEQ TABLET    Take 20 mEq by mouth 2 (two) times daily. Take with food.  Do not crush.  May dissolve.   PRAMIPEXOLE (MIRAPEX) 1.5 MG TABLET    Take 1.5 mg by mouth 2 (two) times daily.    PROTEIN (PROCEL 100) POWD    Take 2 scoop by mouth 2 (two) times daily.   RISPERIDONE (RISPERDAL) 0.25 MG TABLET    Take 0.125 mg by mouth daily. Take 1/2 of a 0.25 mg tablet to = 0.125 mg daily   SERTRALINE (ZOLOFT) 100 MG TABLET    Take 150 mg by mouth daily. Give tablet with a 50 mg tablet to = 150 mg by mouth daily   TIZANIDINE (ZANAFLEX) 4 MG TABLET    Take 2 mg by mouth every 6 (six) hours as needed for muscle spasms. Take 1/2 of a 4 mg tablet to = 2 mg q6h prn.   TOPIRAMATE (TOPAMAX) 25 MG TABLET    Take 25 mg by mouth daily.   TORSEMIDE (DEMADEX) 20 MG TABLET    Take 20 mg by mouth 2 (two) times daily.    ZINC OXIDE (SECURA EXTRA PROTECTIVE EX)    Apply topically as needed. Apply to sacrum and buttocks  Modified Medications   No medications on file  Discontinued Medications   OXYCODONE (OXY-IR) 5 MG CAPSULE    Take 5-10 mg by mouth every 4 (four) hours as needed. Use 5 mg for pain rated 2-5/10 and 10 mg for pain rated 6 or greater/10.   SERTRALINE (ZOLOFT) 25 MG TABLET    Take 25 mg by mouth daily. Give 50 mg and 100 mg tabs to = 175 mg by mouth daily   SERTRALINE (ZOLOFT) 50 MG TABLET    Take 50 mg by mouth daily. Give 1 tablet with a 100 mg and 25 mg tabs to = 175 by mouth daily   TIZANIDINE (ZANAFLEX) 4 MG TABLET    Take 4 mg by mouth every 6 (six) hours as needed for muscle spasms.   WARFARIN (COUMADIN) 5 MG TABLET    Take 5 mg by mouth daily. INR today before starting Coumadin.  Take 5 mg today, 4.5 mg on T-W-Th.  Repeat INR Friday, 02/24/16.   WARFARIN  SODIUM (COUMADIN PO)    Take 4.5 mg by mouth daily.     Allergies  Allergen Reactions  .  Remeron [Mirtazapine]      REVIEW OF SYSTEMS:  GENERAL: no change in appetite, no fatigue, no weight changes, no fever, chills or weakness EYES: Denies change in vision, dry eyes, eye pain, itching or discharge EARS: Denies change in hearing, ringing in ears, or earache NOSE: Denies nasal congestion or epistaxis MOUTH and THROAT: Denies oral discomfort, gingival pain or bleeding, pain from teeth or hoarseness   RESPIRATORY: no cough, SOB, DOE, wheezing, hemoptysis CARDIAC: no chest pain, or palpitations GI: no abdominal pain, diarrhea, constipation, heart burn, nausea or vomiting GU: Denies dysuria, frequency, hematuria, incontinence, or discharge PSYCHIATRIC: Denies feeling of depression or anxiety. No report of hallucinations, insomnia, paranoia, or agitation   PHYSICAL EXAMINATION  GENERAL APPEARANCE: Well nourished. In no acute distress. Obese SKIN:  Bilateral knee surgical incision covered with aquacel dressings, dry, no erythema on the sides HEAD: Normal in size and contour. No evidence of trauma EYES: Lids open and close normally. No blepharitis, entropion or ectropion. PERRL. Conjunctivae are clear and sclerae are white. Lenses are without opacity EARS: Pinnae are normal. Patient hears normal voice tunes of the examiner MOUTH and THROAT: Lips are without lesions. Oral mucosa is moist and without lesions. Tongue is normal in shape, size, and color and without lesions NECK: supple, trachea midline, no neck masses, no thyroid tenderness, no thyromegaly LYMPHATICS: no LAN in the neck, no supraclavicular LAN RESPIRATORY: breathing is even & unlabored, BS CTAB CARDIAC: RRR, no murmur,no extra heart sounds GI: abdomen soft, normal BS, no masses, no tenderness, no hepatomegaly, no splenomegaly EXTREMITIES:  Able to move X 4 extremities PSYCHIATRIC: Alert and oriented X 3. Affect and behavior are appropriate  LABS/RADIOLOGY: Labs reviewed: Basic Metabolic Panel:  Recent Labs   05/10/15 1029  01/09/16 01/31/16 02/17/16  NA 135  < > 141 142 135*  K 3.7  < > 3.5 3.5 3.5  CL 102  --   --   --   --   CO2 27  --   --   --   --   GLUCOSE 103*  --   --   --   --   BUN 26*  < > 35* 44* 25*  CREATININE 1.03*  < > 1.2* 1.2* 1.1  CALCIUM 9.4  --   --   --   --   < > = values in this interval not displayed. Liver Function Tests:  Recent Labs  10/08/15 10/12/15  AST 16 25  ALT 11 23  ALKPHOS 72 212*   CBC:  Recent Labs  05/10/15 1029  02/09/16 02/17/16 02/18/16  WBC 7.2  < > 6.3 13.2 10.6  NEUTROABS 3.9  --   --   --   --   HGB 9.7*  < > 9.9* 7.5* 9.8*  HCT 30.5*  < > 32* 24* 31*  MCV 87.1  --   --   --   --   PLT 157  < > 127* 98* 101*  < > = values in this interval not displayed.   ASSESSMENT/PLAN:  Somnolence - decrease Zoloft from 175 mg to 150 mg daily, decrease risperidone from 0.25 mg to 0.125 mg Q HS; psychiatric consultation  Long-term use of anti-coagulant - INR 3.6, supratherapeutic; hold Coumadin then check INR 02/25/16     [facility], NP [employer] [phone]

## 2016-02-27 ENCOUNTER — Non-Acute Institutional Stay (SKILLED_NURSING_FACILITY): Payer: Medicare Other | Admitting: Adult Health

## 2016-02-27 ENCOUNTER — Encounter: Payer: Self-pay | Admitting: Adult Health

## 2016-02-27 DIAGNOSIS — N390 Urinary tract infection, site not specified: Secondary | ICD-10-CM

## 2016-02-27 DIAGNOSIS — Z7901 Long term (current) use of anticoagulants: Secondary | ICD-10-CM

## 2016-02-27 LAB — TSH: TSH: 0.02 u[IU]/mL — AB (ref 0.41–5.90)

## 2016-02-27 NOTE — Progress Notes (Signed)
Patient ID: Katie Evans, female   DOB: May 18, 1946, 70 y.o.   MRN: [id number]    DATE:   02/27/16  MRN:  [id number]  BIRTHDAY: May 18, 1946  Facility:  Nursing Home Location:  [facility]  Nursing Home Room Number: 1005-2  LEVEL OF CARE:  SNF [phone])  Contact Information    Name Relation Home Work [place]   Grast,Lauran Sister [phone]   Fuller Planaylor,Dana Sister   [phone]       Code Status History    This patient does not have a recorded code status. Please follow your organizational policy for patients in this situation.       Chief Complaint  Patient presents with  . Acute Visit    Coumadin therapy, UTI    HISTORY OF PRESENT ILLNESS:  This is a 70 year old female who has been noted to be sleepy last week and urinalysis was done. Urine culture showed > 100,000 CFU/ml Klebsiella pneumoniae. No complaints of hematuria nor dysuria and fever. Latest INR is 1.7; subtherapeutic. Coumadin was held 3 days due to supratherapeutic INR. She had a recent bilateral knee arthroplasty and a history of pulmonary embolism. No SOB noted.   PAST MEDICAL HISTORY:  Past Medical History  Diagnosis Date  . Renal disorder   . CHF (congestive heart failure) (HCC)   . Arthritis   . Hypertension   . Carpal tunnel syndrome   . Metabolic encephalopathy   . Anemia   . Major depressive disorder (HCC)   . Thrombocytopathia (HCC)   . Demand ischemia of myocardium (HCC)   . Chronic pulmonary embolism (HCC)   . Protein calorie malnutrition (HCC)   . Allergic rhinitis   . Hyperlipidemia   . Hyperthyroidism   . Generalized anxiety disorder   . GERD without esophagitis   . Hypokalemia   . Overactive bladder   . Psychosis   . RLS (restless legs syndrome)   . Chronic diarrhea   . Flatulence   . Bruising   . Long term current use of anticoagulant therapy   . Thrombocytopenia (HCC)      CURRENT MEDICATIONS: Reviewed  Patient's Medications  New  Prescriptions   No medications on file  Previous Medications   ASPIRIN 81 MG CHEWABLE TABLET    Chew 81 mg by mouth daily.    ATORVASTATIN (LIPITOR) 40 MG TABLET    Take 40 mg by mouth at bedtime.   BISMUTH SUBGALLATE (DEVROM) 200 MG CHEW    Chew 1 tablet by mouth daily.    CHOLECALCIFEROL (D3-1000 PO)    Take 1 tablet by mouth daily.   CLONAZEPAM (KLONOPIN) 0.5 MG TABLET    Take 0.5 mg by mouth daily. Take 1 tablet by mouth at bedtime and 1 tablet QD PRN for anxiety/restlessness   DICLOFENAC SODIUM (VOLTAREN) 1 % GEL    Apply 2 g topically 2 (two) times daily as needed. Apply to bilateral shoulders and knees BID PRN   ESTROGENS, CONJUGATED, (PREMARIN) 0.625 MG TABLET    Place 0.625 mg vaginally 2 (two) times a week. Apply one-half gm intravaginally 2x weekly.   FAMOTIDINE (PEPCID) 20 MG TABLET    Take 20 mg by mouth daily.    FERROUS SULFATE (FEROSUL PO)    Take 325 mg by mouth 2 (two) times daily.    HYDROCODONE-ACETAMINOPHEN (NORCO/VICODIN) 5-325 MG TABLET    Take 1-2 tablets by mouth every 4 (four) hours as needed for moderate pain or severe pain.   HYPROMELLOSE (ARTIFICIAL  TEARS OP)    Apply to eye 3 (three) times daily as needed. Both eyes   LOPERAMIDE (IMODIUM) 2 MG CAPSULE    Take 2 mg by mouth 4 (four) times daily as needed for diarrhea or loose stools.    LORATADINE (CLARITIN) 10 MG TABLET    Take 10 mg by mouth daily as needed.    METHIMAZOLE (TAPAZOLE) 5 MG TABLET    Take 15 mg by mouth daily. Take three (3) tabs to equal 15 mg PO QD   MULTIPLE VITAMINS-MINERALS (DECUBI-VITE) CAPS    Take 1 capsule by mouth daily.   NADOLOL (CORGARD) 40 MG TABLET    Take 40 mg by mouth daily.   NITROGLYCERIN (NITROSTAT) 0.4 MG SL TABLET    Place 0.4 mg under the tongue every 5 (five) minutes as needed for chest pain. For 3 doses as needed for chest pain   NORTRIPTYLINE (PAMELOR) 25 MG CAPSULE    Take 25 mg by mouth at bedtime.   OXYBUTYNIN (DITROPAN) 5 MG TABLET    Take 5 mg by mouth 3 (three) times  daily as needed for bladder spasms.   POLYCARBOPHIL (FIBERCON) 625 MG TABLET    Take 625 mg by mouth daily as needed (chronic diarrhea).    POTASSIUM CHLORIDE SA (K-DUR,KLOR-CON) 20 MEQ TABLET    Take 20 mEq by mouth 2 (two) times daily. Take with food.  Do not crush.  May dissolve.   PRAMIPEXOLE (MIRAPEX) 1.5 MG TABLET    Take 1.5 mg by mouth 2 (two) times daily.    RISPERIDONE (RISPERDAL) 0.25 MG TABLET    Take 0.125 mg by mouth daily. Take 1/2 of a 0.25 mg tablet to = 0.125 mg daily   SERTRALINE (ZOLOFT) 100 MG TABLET    Take 150 mg by mouth daily. Give tablet with a 50 mg tablet to = 150 mg by mouth daily   TIZANIDINE (ZANAFLEX) 4 MG TABLET    Take 2 mg by mouth every 6 (six) hours as needed for muscle spasms. Take 1/2 of a 4 mg tablet to = 2 mg q6h prn.   TOPIRAMATE (TOPAMAX) 25 MG TABLET    Take 25 mg by mouth daily.   TORSEMIDE (DEMADEX) 20 MG TABLET    Take 20 mg by mouth 2 (two) times daily.    WARFARIN (COUMADIN) 4 MG TABLET    Take 4 mg by mouth daily.   ZINC OXIDE (SECURA EXTRA PROTECTIVE EX)    Apply topically as needed. Apply to sacrum and buttocks  Modified Medications   No medications on file  Discontinued Medications   PROTEIN (PROCEL 100) POWD    Take 2 scoop by mouth 2 (two) times daily.     Allergies  Allergen Reactions  . Remeron [Mirtazapine]      REVIEW OF SYSTEMS:  GENERAL: no change in appetite, no fatigue, no weight changes, no fever, chills or weakness EYES: Denies change in vision, dry eyes, eye pain, itching or discharge EARS: Denies change in hearing, ringing in ears, or earache NOSE: Denies nasal congestion or epistaxis MOUTH and THROAT: Denies oral discomfort, gingival pain or bleeding, pain from teeth or hoarseness   RESPIRATORY: no cough, SOB, DOE, wheezing, hemoptysis CARDIAC: no chest pain, or palpitations GI: no abdominal pain, diarrhea, constipation, heart burn, nausea or vomiting GU: Denies dysuria, frequency, hematuria, incontinence, or  discharge PSYCHIATRIC: Denies feeling of depression or anxiety. No report of hallucinations, insomnia, paranoia, or agitation   PHYSICAL EXAMINATION  GENERAL APPEARANCE:  Well nourished. In no acute distress. Obese SKIN:  Bilateral knee surgical incision covered with aquacel dressings, dry, no erythema on the sides HEAD: Normal in size and contour. No evidence of trauma EYES: Lids open and close normally. No blepharitis, entropion or ectropion. PERRL. Conjunctivae are clear and sclerae are white. Lenses are without opacity EARS: Pinnae are normal. Patient hears normal voice tunes of the examiner MOUTH and THROAT: Lips are without lesions. Oral mucosa is moist and without lesions. Tongue is normal in shape, size, and color and without lesions NECK: supple, trachea midline, no neck masses, no thyroid tenderness, no thyromegaly LYMPHATICS: no LAN in the neck, no supraclavicular LAN RESPIRATORY: breathing is even & unlabored, BS CTAB CARDIAC: RRR, no murmur,no extra heart sounds GI: abdomen soft, normal BS, no masses, no tenderness, no hepatomegaly, no splenomegaly EXTREMITIES:  Able to move X 4 extremities PSYCHIATRIC: Alert and oriented X 3. Affect and behavior are appropriate  LABS/RADIOLOGY: Labs reviewed: 02/22/16  urine culture shows Klebsiella pneumoniae > 100,000 CFU/ml Basic Metabolic Panel:  Recent Labs  16/10/96 1029  01/31/16 02/17/16 02/22/16  NA 135  < > 142 135* 136*  K 3.7  < > 3.5 3.5 3.8  CL 102  --   --   --   --   CO2 27  --   --   --   --   GLUCOSE 103*  --   --   --   --   BUN 26*  < > 44* 25* 32*  CREATININE 1.03*  < > 1.2* 1.1 1.3*  CALCIUM 9.4  --   --   --   --   < > = values in this interval not displayed. Liver Function Tests:  Recent Labs  10/08/15 10/12/15 02/22/16  AST ALT ALKPHOS 72 212* 138*   CBC:  Recent Labs  05/10/15 1029  02/17/16 02/18/16 02/22/16  WBC 7.2  < > 13.2 10.6 8.9  NEUTROABS 3.9  --   --   --   --    HGB 9.7*  < > 7.5* 9.8* 8.9*  HCT 30.5*  < > 24* 31* 28*  MCV 87.1  --   --   --   --   PLT 157  < > 98* 101* 147*  < > = values in this interval not displayed.   ASSESSMENT/PLAN:  UTI  -start Macrobid 100 mg by mouth twice a day 7 days and    Florastor 250 mg 1 capsule by mouth twice a day 10 days  Long-term use of anti-coagulant - INR 1.7, subtherapeutic; decrease Coumadin to 4 mg by mouth daily and INR on 03/01/16     [facility], NP [employer] [phone]

## 2016-02-28 ENCOUNTER — Non-Acute Institutional Stay (SKILLED_NURSING_FACILITY): Payer: Medicare Other | Admitting: Adult Health

## 2016-02-28 ENCOUNTER — Other Ambulatory Visit: Payer: Self-pay

## 2016-02-28 ENCOUNTER — Encounter: Payer: Self-pay | Admitting: Adult Health

## 2016-02-28 DIAGNOSIS — E0590 Thyrotoxicosis, unspecified without thyrotoxic crisis or storm: Secondary | ICD-10-CM | POA: Diagnosis not present

## 2016-02-28 LAB — CBC AND DIFFERENTIAL
HEMATOCRIT: 28 % — AB (ref 36–46)
HEMOGLOBIN: 8.4 g/dL — AB (ref 12.0–16.0)
Neutrophils Absolute: 5 /uL
Platelets: 282 10*3/uL (ref 150–399)
WBC: 7.5 10*3/mL

## 2016-02-28 MED ORDER — HYDROCODONE-ACETAMINOPHEN 5-325 MG PO TABS: 1.0000 | ORAL_TABLET | ORAL | Status: DC | PRN

## 2016-02-28 NOTE — Telephone Encounter (Signed)
[facility]  [address] [place] [address]  Phone: [phone]  Fax: [phone]  

## 2016-02-28 NOTE — Progress Notes (Signed)
Patient ID: Katie Evans, female   DOB: [birth year], 70 y.o.   MRN: [id number]    DATE:   02/28/16  MRN:  [id number]  BIRTHDAY: 1946/06/14  Facility:  Nursing Home Location:  [facility]  Nursing Home Room Number: 1005-2  LEVEL OF CARE:  SNF [phone])  Contact Information    Name Relation Home Work Moonshine Sister [phone]   Fuller Plan   [phone]       Code Status History    This patient does not have a recorded code status. Please follow your organizational policy for patients in this situation.       Chief Complaint  Patient presents with  . Acute Visit    Hyperthyroidism    HISTORY OF PRESENT ILLNESS:  This is a 70 year old female who has tsh <0.015. No complaints of palpitation nor weight loss. She is currently taking Tapazole for hyperthyroidism.  PAST MEDICAL HISTORY:  Past Medical History  Diagnosis Date  . Renal disorder   . CHF (congestive heart failure) (HCC)   . Arthritis   . Hypertension   . Carpal tunnel syndrome   . Metabolic encephalopathy   . Anemia   . Major depressive disorder (HCC)   . Thrombocytopathia (HCC)   . Demand ischemia of myocardium (HCC)   . Chronic pulmonary embolism (HCC)   . Protein calorie malnutrition (HCC)   . Allergic rhinitis   . Hyperlipidemia   . Hyperthyroidism   . Generalized anxiety disorder   . GERD without esophagitis   . Hypokalemia   . Overactive bladder   . Psychosis   . RLS (restless legs syndrome)   . Chronic diarrhea   . Flatulence   . Bruising   . Long term current use of anticoagulant therapy   . Thrombocytopenia (HCC)      CURRENT MEDICATIONS: Reviewed  Patient's Medications  New Prescriptions   No medications on file  Previous Medications   ASPIRIN 81 MG CHEWABLE TABLET    Chew 81 mg by mouth daily.    ATORVASTATIN (LIPITOR) 40 MG TABLET    Take 40 mg by mouth at bedtime.   BISMUTH SUBGALLATE (DEVROM) 200 MG CHEW    Chew  1 tablet by mouth daily.    CHOLECALCIFEROL (D3-1000 PO)    Take 1 tablet by mouth daily.   CLONAZEPAM (KLONOPIN) 0.5 MG TABLET    Take 0.5 mg by mouth daily. Take 1 tablet by mouth at bedtime and 1 tablet QD PRN for anxiety/restlessness   DICLOFENAC SODIUM (VOLTAREN) 1 % GEL    Apply 2 g topically 2 (two) times daily as needed. Apply to bilateral shoulders and knees BID PRN   ESTROGENS, CONJUGATED, (PREMARIN) 0.625 MG TABLET    Place 0.625 mg vaginally 2 (two) times a week. Apply one-half gm intravaginally 2x weekly.   FAMOTIDINE (PEPCID) 20 MG TABLET    Take 20 mg by mouth daily.    FERROUS SULFATE (FEROSUL PO)    Take 325 mg by mouth 2 (two) times daily.    HYDROCODONE-ACETAMINOPHEN (NORCO/VICODIN) 5-325 MG TABLET    Take 1-2 tablets by mouth every 4 (four) hours as needed for moderate pain.   HYPROMELLOSE (ARTIFICIAL TEARS OP)    Apply to eye 3 (three) times daily as needed. Both eyes   LOPERAMIDE (IMODIUM) 2 MG CAPSULE    Take 2 mg by mouth 4 (four) times daily as needed for diarrhea or loose stools.    LORATADINE (CLARITIN)  10 MG TABLET    Take 10 mg by mouth daily as needed.    METHIMAZOLE (TAPAZOLE) 5 MG TABLET    Take 10 mg by mouth daily. Take two (2) tabs to equal 10 mg PO BID   MULTIPLE VITAMINS-MINERALS (DECUBI-VITE) CAPS    Take 1 capsule by mouth daily.   NADOLOL (CORGARD) 40 MG TABLET    Take 40 mg by mouth daily.   NITROFURANTOIN, MACROCRYSTAL-MONOHYDRATE, (MACROBID) 100 MG CAPSULE    Take 100 mg by mouth 2 (two) times daily. Take for seven (7) days   NITROGLYCERIN (NITROSTAT) 0.4 MG SL TABLET    Place 0.4 mg under the tongue every 5 (five) minutes as needed for chest pain. For 3 doses as needed for chest pain   NORTRIPTYLINE (PAMELOR) 25 MG CAPSULE    Take 25 mg by mouth at bedtime.   OXYBUTYNIN (DITROPAN) 5 MG TABLET    Take 5 mg by mouth 3 (three) times daily as needed for bladder spasms.   POLYCARBOPHIL (FIBERCON) 625 MG TABLET    Take 625 mg by mouth daily as needed (chronic  diarrhea).    POTASSIUM CHLORIDE SA (K-DUR,KLOR-CON) 20 MEQ TABLET    Take 20 mEq by mouth 2 (two) times daily. Take with food.  Do not crush.  Katie dissolve.   PRAMIPEXOLE (MIRAPEX) 1.5 MG TABLET    Take 1.5 mg by mouth 2 (two) times daily.    RISPERIDONE (RISPERDAL) 0.25 MG TABLET    Take 0.125 mg by mouth at bedtime. Take 1/2 of a 0.25 mg tablet to = 0.125 mg daily   SACCHAROMYCES BOULARDII (FLORASTOR) 250 MG CAPSULE    Take 250 mg by mouth 2 (two) times daily. Take for 10 days   SERTRALINE (ZOLOFT) 100 MG TABLET    Take 150 mg by mouth daily. Give tablet with a 50 mg tablet to = 150 mg by mouth daily   TIZANIDINE (ZANAFLEX) 4 MG TABLET    Take 2 mg by mouth every 6 (six) hours as needed for muscle spasms. Take 1/2 of a 4 mg tablet to = 2 mg q6h prn.   TOPIRAMATE (TOPAMAX) 25 MG TABLET    Take 25 mg by mouth daily.   TORSEMIDE (DEMADEX) 20 MG TABLET    Take 20 mg by mouth 2 (two) times daily.    WARFARIN (COUMADIN) 4 MG TABLET    Take 4 mg by mouth daily.   ZINC OXIDE (SECURA EXTRA PROTECTIVE EX)    Apply topically as needed. Apply to sacrum and buttocks  Modified Medications   No medications on file  Discontinued Medications   HYDROCODONE-ACETAMINOPHEN (NORCO/VICODIN) 5-325 MG TABLET    Take 1 tablet by mouth every 4 (four) hours as needed for moderate pain or severe pain.   PHYTONADIONE (VITAMIN K) 5 MG TABLET    Take 5 mg by mouth once.     Allergies  Allergen Reactions  . Remeron [Mirtazapine]      REVIEW OF SYSTEMS:  GENERAL: no change in appetite, no fatigue, no weight changes, no fever, chills or weakness EYES: Denies change in vision, dry eyes, eye pain, itching or discharge EARS: Denies change in hearing, ringing in ears, or earache NOSE: Denies nasal congestion or epistaxis MOUTH and THROAT: Denies oral discomfort, gingival pain or bleeding, pain from teeth or hoarseness   RESPIRATORY: no cough, SOB, DOE, wheezing, hemoptysis CARDIAC: no chest pain, or palpitations GI:  no abdominal pain, diarrhea, constipation, heart burn, nausea or vomiting GU:  Denies dysuria, frequency, hematuria, incontinence, or discharge PSYCHIATRIC: Denies feeling of depression or anxiety. No report of hallucinations, insomnia, paranoia, or agitation   PHYSICAL EXAMINATION  GENERAL APPEARANCE: Well nourished. In no acute distress. Obese SKIN:  Bilateral knee surgical incision covered with aquacel dressings, dry, no erythema on the sides HEAD: Normal in size and contour. No evidence of trauma EYES: Lids open and close normally. No blepharitis, entropion or ectropion. PERRL. Conjunctivae are clear and sclerae are white. Lenses are without opacity EARS: Pinnae are normal. Patient hears normal voice tunes of the examiner MOUTH and THROAT: Lips are without lesions. Oral mucosa is moist and without lesions. Tongue is normal in shape, size, and color and without lesions NECK: supple, trachea midline, no neck masses, no thyroid tenderness, no thyromegaly LYMPHATICS: no LAN in the neck, no supraclavicular LAN RESPIRATORY: breathing is even & unlabored, BS CTAB CARDIAC: RRR, no murmur,no extra heart sounds GI: abdomen soft, normal BS, no masses, no tenderness, no hepatomegaly, no splenomegaly EXTREMITIES:  Able to move X 4 extremities PSYCHIATRIC: Alert and oriented X 3. Affect and behavior are appropriate  LABS/RADIOLOGY: Labs reviewed: 02/27/16  tsh <0.015 02/22/16  urine culture shows Klebsiella pneumoniae > 100,000 CFU/ml Basic Metabolic Panel:  Recent Labs  62/95/28 1029  02/17/16 02/22/16 02/24/16  NA 135  < > 135* 136* 139  K 3.7  < > 3.5 3.8 3.7  CL 102  --   --   --   --   CO2 27  --   --   --   --   GLUCOSE 103*  --   --   --   --   BUN 26*  < > 25* 32* 35*  CREATININE 1.03*  < > 1.1 1.3* 1.3*  CALCIUM 9.4  --   --   --   --   < > = values in this interval not displayed. Liver Function Tests:  Recent Labs  10/12/15 02/22/16 02/24/16  AST ALT ALKPHOS 212* 138* 145*   CBC:  Recent Labs  05/10/15 1029  02/22/16 02/24/16 02/28/16  WBC 7.2  < > 8.9 7.9 7.5  NEUTROABS 3.9  --   --  5 5  HGB 9.7*  < > 8.9* 8.6* 8.4*  HCT 30.5*  < > 28* 28* 28*  MCV 87.1  --   --   --   --   PLT 157  < > 147* 200 282  < > = values in this interval not displayed.   ASSESSMENT/PLAN:  Hyperthyroidism - TSH <0.015, increase Tapazole to 10 mg 1 tab by mouth twice a day; TSH in 6 weeks     [facility], NP [facility] [phone]

## 2016-03-01 ENCOUNTER — Non-Acute Institutional Stay (SKILLED_NURSING_FACILITY): Payer: Medicare Other | Admitting: Adult Health

## 2016-03-01 ENCOUNTER — Encounter: Payer: Self-pay | Admitting: Adult Health

## 2016-03-01 DIAGNOSIS — I2782 Chronic pulmonary embolism: Secondary | ICD-10-CM | POA: Diagnosis not present

## 2016-03-01 DIAGNOSIS — Z7901 Long term (current) use of anticoagulants: Secondary | ICD-10-CM | POA: Diagnosis not present

## 2016-03-01 NOTE — Progress Notes (Signed)
Patient ID: Katie Roseamela Evans, female   DOB: 1946/04/28, 70 y.o.   MRN: [id number] Subjective:     Indication: DVT prophylaxis and PE Bleeding signs/symptoms: None Thromboembolic signs/symptoms: None  Missed Coumadin doses: None Medication changes: Yes, on Macrobid for UTI Dietary changes: no Bacterial/viral infection: Yes, on Macrobid for UTI Other concerns: no  The following portions of the patient's history were reviewed and updated as appropriate: allergies, current medications, past family history, past medical history, past social history, past surgical history and problem list.  Review of Systems A comprehensive review of systems was negative.   Objective:    INR Today: 8.0 Current dose: Coumadin 4 mg daily     Assessment:    Supratherapeutic INR for goal of 2-3   Plan:    1. New dose: Hold Coumadin   2. Next INR: 03/02/16

## 2016-03-06 ENCOUNTER — Encounter: Payer: Self-pay | Admitting: Adult Health

## 2016-03-06 ENCOUNTER — Non-Acute Institutional Stay (SKILLED_NURSING_FACILITY): Payer: Medicare Other | Admitting: Adult Health

## 2016-03-06 DIAGNOSIS — Z7901 Long term (current) use of anticoagulants: Secondary | ICD-10-CM

## 2016-03-06 DIAGNOSIS — I2782 Chronic pulmonary embolism: Secondary | ICD-10-CM | POA: Diagnosis not present

## 2016-03-06 NOTE — Progress Notes (Signed)
Patient ID: Katie Evans, female   DOB: Jun 27, 1946, 70 y.o.   MRN: [id number] Subjective:     Indication: PE Bleeding signs/symptoms: None Thromboembolic signs/symptoms: None  Missed Coumadin doses: This week - 1 due to supratherapeutic INR Medication changes: no Dietary changes: no Bacterial/viral infection: no Other concerns: no  The following portions of the patient's history were reviewed and updated as appropriate: allergies, current medications, past family history, past medical history, past social history, past surgical history and problem list.  Review of Systems A comprehensive review of systems was negative.   Objective:    INR Today: 1.8 Current dose: Coumadin 5 mg daily     Assessment:    Therapeutic INR for goal of 2-3   Plan:    1. New dose:  Discontinue Coumadin 5 mg daily and start Coumadin 2.5 mg daily   2. Next INR: 03/08/16

## 2016-03-12 ENCOUNTER — Other Ambulatory Visit: Payer: Self-pay | Admitting: Internal Medicine

## 2016-03-12 DIAGNOSIS — Z1231 Encounter for screening mammogram for malignant neoplasm of breast: Secondary | ICD-10-CM

## 2016-03-14 LAB — HEMOGLOBIN A1C: Hemoglobin A1C: 5.6

## 2016-03-21 ENCOUNTER — Non-Acute Institutional Stay (SKILLED_NURSING_FACILITY): Payer: Medicare Other | Admitting: Adult Health

## 2016-03-21 ENCOUNTER — Encounter: Payer: Self-pay | Admitting: Adult Health

## 2016-03-21 DIAGNOSIS — K529 Noninfective gastroenteritis and colitis, unspecified: Secondary | ICD-10-CM

## 2016-03-21 DIAGNOSIS — J309 Allergic rhinitis, unspecified: Secondary | ICD-10-CM

## 2016-03-21 DIAGNOSIS — Z86711 Personal history of pulmonary embolism: Secondary | ICD-10-CM | POA: Diagnosis not present

## 2016-03-21 DIAGNOSIS — F419 Anxiety disorder, unspecified: Secondary | ICD-10-CM

## 2016-03-21 DIAGNOSIS — F32A Depression, unspecified: Secondary | ICD-10-CM

## 2016-03-21 DIAGNOSIS — K219 Gastro-esophageal reflux disease without esophagitis: Secondary | ICD-10-CM

## 2016-03-21 DIAGNOSIS — M170 Bilateral primary osteoarthritis of knee: Secondary | ICD-10-CM

## 2016-03-21 DIAGNOSIS — F329 Major depressive disorder, single episode, unspecified: Secondary | ICD-10-CM | POA: Diagnosis not present

## 2016-03-21 DIAGNOSIS — G43909 Migraine, unspecified, not intractable, without status migrainosus: Secondary | ICD-10-CM

## 2016-03-21 DIAGNOSIS — E0590 Thyrotoxicosis, unspecified without thyrotoxic crisis or storm: Secondary | ICD-10-CM | POA: Diagnosis not present

## 2016-03-21 DIAGNOSIS — G2581 Restless legs syndrome: Secondary | ICD-10-CM

## 2016-03-21 DIAGNOSIS — I10 Essential (primary) hypertension: Secondary | ICD-10-CM | POA: Diagnosis not present

## 2016-03-21 DIAGNOSIS — E43 Unspecified severe protein-calorie malnutrition: Secondary | ICD-10-CM

## 2016-03-21 DIAGNOSIS — I5032 Chronic diastolic (congestive) heart failure: Secondary | ICD-10-CM

## 2016-03-21 DIAGNOSIS — E785 Hyperlipidemia, unspecified: Secondary | ICD-10-CM

## 2016-03-21 DIAGNOSIS — N952 Postmenopausal atrophic vaginitis: Secondary | ICD-10-CM

## 2016-03-21 DIAGNOSIS — F29 Unspecified psychosis not due to a substance or known physiological condition: Secondary | ICD-10-CM

## 2016-03-21 DIAGNOSIS — R143 Flatulence: Secondary | ICD-10-CM

## 2016-03-21 DIAGNOSIS — E876 Hypokalemia: Secondary | ICD-10-CM

## 2016-03-21 DIAGNOSIS — D62 Acute posthemorrhagic anemia: Secondary | ICD-10-CM

## 2016-03-21 NOTE — Progress Notes (Signed)
Patient ID: Katie Roseamela Evans, female   DOB: 1946/04/29, 70 y.o.   MRN: [id number]    DATE:    03/21/16  MRN:  [id number]  BIRTHDAY: 1946/04/29  Facility:  Nursing Home Location:  [facility]  Nursing Home Room Number: 1005-2  LEVEL OF CARE:  SNF [phone](31)  Contact Information    Name Relation Home Work [place]   Grast,Lauran Sister [phone]   Fuller Planaylor,Dana Sister   [phone]       Code Status History    This patient does not have a recorded code status. Please follow your organizational policy for patients in this situation.       Chief Complaint  Patient presents with  . Medical Management of Chronic Issues    HISTORY OF PRESENT ILLNESS:  This is a 70 year old female who is being seen for a routine visit. She is a long-term resident @ [facility] and currently having rehabilitation. She recently completed antibiotic for UTI. Tapazole dosage was recently increased due to tsh < 0.015. She was started on Procel due to albumin 2.37, low. Zoloft dosage was decreased due to somnolence.  She has been re-ad[facility] today, 02/20/16 [facility]. She has PMH of arthritis, carpal tunnel syndrome, CHF, hemorrhoids and hypertension. She has osteoarthritis of bilateral knees for which she had bilateral total knee arthroplasties on 02/14/16.  PAST MEDICAL HISTORY:  Past Medical History  Diagnosis Date  . Renal disorder   . CHF (congestive heart failure) (HCC)   . Arthritis   . Hypertension   . Carpal tunnel syndrome   . Metabolic encephalopathy   . Anemia   . Major depressive disorder (HCC)   . Thrombocytopathia (HCC)   . Demand ischemia of myocardium (HCC)   . Chronic pulmonary embolism (HCC)   . Protein calorie malnutrition (HCC)   . Allergic rhinitis   . Hyperlipidemia   . Hyperthyroidism   . Generalized anxiety disorder   . GERD without esophagitis   . Hypokalemia   . Overactive bladder   .  Psychosis   . RLS (restless legs syndrome)   . Chronic diarrhea   . Flatulence   . Bruising   . Long term current use of anticoagulant therapy   . Thrombocytopenia (HCC)      CURRENT MEDICATIONS: Reviewed  Patient's Medications  New Prescriptions   No medications on file  Previous Medications   ASPIRIN 81 MG CHEWABLE TABLET    Chew 81 mg by mouth daily.    ATORVASTATIN (LIPITOR) 40 MG TABLET    Take 40 mg by mouth at bedtime.   BISMUTH SUBGALLATE (DEVROM) 200 MG CHEW    Chew 1 tablet by mouth daily.    CHOLECALCIFEROL (D3-1000 PO)    Take 1 tablet by mouth daily.   DICLOFENAC SODIUM (VOLTAREN) 1 % GEL    Apply 2 g topically 2 (two) times daily as needed. Apply to bilateral shoulders and knees BID PRN   ESTROGENS, CONJUGATED, (PREMARIN) 0.625 MG TABLET    Place 0.625 mg vaginally 2 (two) times a week. Apply one-half gm intravaginally 2x weekly.   FAMOTIDINE (PEPCID) 20 MG TABLET    Take 20 mg by mouth daily.    FERROUS SULFATE (FEROSUL PO)    Take 325 mg by mouth 2 (two) times daily.    HYPROMELLOSE (ARTIFICIAL TEARS OP)    Apply to eye 3 (three) times daily as needed. Both eyes   LOPERAMIDE (IMODIUM) 2 MG CAPSULE  Take 2 mg by mouth 4 (four) times daily as needed for diarrhea or loose stools.    LORATADINE (CLARITIN) 10 MG TABLET    Take 10 mg by mouth daily as needed.    METHIMAZOLE (TAPAZOLE) 5 MG TABLET    Take 10 mg by mouth daily. Take two (2) tabs to equal 10 mg PO BID   MULTIPLE VITAMINS-MINERALS (DECUBI-VITE) CAPS    Take 1 capsule by mouth daily.   NADOLOL (CORGARD) 40 MG TABLET    Take 40 mg by mouth daily.   NITROGLYCERIN (NITROSTAT) 0.4 MG SL TABLET    Place 0.4 mg under the tongue every 5 (five) minutes as needed for chest pain. For 3 doses as needed for chest pain   NORTRIPTYLINE (PAMELOR) 25 MG CAPSULE    Take 25 mg by mouth at bedtime.   OXYBUTYNIN (DITROPAN) 5 MG TABLET    Take 5 mg by mouth 3 (three) times daily as needed for bladder spasms.   POLYCARBOPHIL  (FIBERCON) 625 MG TABLET    Take 625 mg by mouth daily as needed (chronic diarrhea).    POTASSIUM CHLORIDE SA (K-DUR,KLOR-CON) 20 MEQ TABLET    Take 20 mEq by mouth 2 (two) times daily. Take with food.  Do not crush.  May dissolve.   PRAMIPEXOLE (MIRAPEX) 1.5 MG TABLET    Take 1.5 mg by mouth 2 (two) times daily.    RISPERIDONE (RISPERDAL) 0.25 MG TABLET    Take 0.125 mg by mouth at bedtime. Take 1/2 of a 0.25 mg tablet to = 0.125 mg daily   SERTRALINE (ZOLOFT) 100 MG TABLET    Take 150 mg by mouth daily. Give tablet with a 50 mg tablet to = 150 mg by mouth daily   TIZANIDINE (ZANAFLEX) 4 MG TABLET    Take 2 mg by mouth every 6 (six) hours as needed for muscle spasms. Take 1/2 of a 4 mg tablet to = 2 mg q6h prn.   TOPIRAMATE (TOPAMAX) 25 MG TABLET    Take 25 mg by mouth daily.   TORSEMIDE (DEMADEX) 20 MG TABLET    Take 20 mg by mouth 2 (two) times daily.    WARFARIN (COUMADIN) 2.5 MG TABLET    Take 2.5 mg by mouth daily.   ZINC OXIDE (SECURA EXTRA PROTECTIVE EX)    Apply topically as needed. Apply to sacrum and buttocks  Modified Medications   Modified Medication Previous Medication   HYDROCODONE-ACETAMINOPHEN (NORCO/VICODIN) 5-325 MG TABLET HYDROcodone-acetaminophen (NORCO/VICODIN) 5-325 MG tablet      Take 1 tablet by mouth every 4 (four) hours as needed for moderate pain.    Take 1-2 tablets by mouth every 4 (four) hours as needed for moderate pain.  Discontinued Medications   No medications on file     Allergies  Allergen Reactions  . Remeron [Mirtazapine]      REVIEW OF SYSTEMS:  GENERAL: no change in appetite, no fatigue, no weight changes, no fever, chills or weakness EYES: Denies change in vision, dry eyes, eye pain, itching or discharge EARS: Denies change in hearing, ringing in ears, or earache NOSE: Denies nasal congestion or epistaxis MOUTH and THROAT: Denies oral discomfort, gingival pain or bleeding, pain from teeth or hoarseness   RESPIRATORY: no cough, SOB, DOE,  wheezing, hemoptysis CARDIAC: no chest pain, or palpitations GI: no abdominal pain, diarrhea, constipation, heart burn, nausea or vomiting GU: Denies dysuria, frequency, hematuria, incontinence, or discharge PSYCHIATRIC: Denies feeling of depression or anxiety. No report of hallucinations,  insomnia, paranoia, or agitation   PHYSICAL EXAMINATION  GENERAL APPEARANCE: Well nourished. In no acute distress. Obese SKIN:  Bilateral knee surgical incision are dry, no erythema HEAD: Normal in size and contour. No evidence of trauma EYES: Lids open and close normally. No blepharitis, entropion or ectropion. PERRL. Conjunctivae are clear and sclerae are white. Lenses are without opacity EARS: Pinnae are normal. Patient hears normal voice tunes of the examiner MOUTH and THROAT: Lips are without lesions. Oral mucosa is moist and without lesions. Tongue is normal in shape, size, and color and without lesions NECK: supple, trachea midline, no neck masses, no thyroid tenderness, no thyromegaly LYMPHATICS: no LAN in the neck, no supraclavicular LAN RESPIRATORY: breathing is even & unlabored, BS CTAB CARDIAC: RRR, no murmur,no extra heart sounds GI: abdomen soft, normal BS, no masses, no tenderness, no hepatomegaly, no splenomegaly EXTREMITIES:  Able to move X 4 extremities PSYCHIATRIC: Alert and oriented X 3. Affect and behavior are appropriate  LABS/RADIOLOGY: Labs reviewed: Basic Metabolic Panel:  Recent Labs  16/10/96 1029  02/17/16 02/22/16 02/24/16  NA 135  < > 135* 136* 139  K 3.7  < > 3.5 3.8 3.7  CL 102  --   --   --   --   CO2 27  --   --   --   --   GLUCOSE 103*  --   --   --   --   BUN 26*  < > 25* 32* 35*  CREATININE 1.03*  < > 1.1 1.3* 1.3*  CALCIUM 9.4  --   --   --   --   < > = values in this interval not displayed. Liver Function Tests:  Recent Labs  10/12/15 02/22/16 02/24/16  AST 25 18 16   ALT 23 11 11   ALKPHOS 212* 138* 145*   CBC:  Recent Labs  05/10/15 1029   02/22/16 02/24/16 02/28/16  WBC 7.2  < > 8.9 7.9 7.5  NEUTROABS 3.9  --   --  5 5  HGB 9.7*  < > 8.9* 8.6* 8.4*  HCT 30.5*  < > 28* 28* 28*  MCV 87.1  --   --   --   --   PLT 157  < > 147* 200 282  < > = values in this interval not displayed.   ASSESSMENT/PLAN:  Osteoarthritis S/P total bilateral knee replacement - continue rehabilitation; continue oxycodone 5 mg 1-2 tabs by mouth every 4 hours when necessary for pain; Zanaflex 4 mg 1 tab by mouth every 6 hours when necessary for muscle spasm; Coumadin for DVT prophylaxis; BLE WBAT;  follow-up with orthopedics, Dr. Denyce Robert Pill  Hx of pulmonary embolism - no SOB; continue Coumadin   Allergic rhinitis - continue Claritin 10 mg 1 tab PO daily PRN  Chronic diastolic CHF - continue Demadex 20 mg to 1 tab by mouth BID ; weigh Q Mondays and Wednesdays; continue 1800 ml fluid restriction  Hypertension - well controlled; continue Nadolol 40 mg by mouth daily  Hyperlipidemia - continue Lipitor 40 mg by mouth daily at bedtime  Anxiety - mood is is stable; continue Klonopin 0.5 mg by mouth Q HS and 0.5 mg Q D PRN   Depression - mood is stable; recently decreased Zoloft from 175 mg  To 150 mg by mouth daily and Pamelor 25 mg PO Q HS  Chronic diarrhea -hx of gastric bypass surgery; continue Imodium 2 mg by mouth 4 times a day when necessary  Hypokalemia - K 3.7 ; continue KCl 20 MEQ ER by mouth twice a day  Anemia, acute blood loss  - hgb 8.4 continue ferrous sulfate 325 mg by mouth BID  Overactive Bladder - continue Oxybutynin 5 mg 1 tab PO TID PRN  GERD - continue Pepcid 20 mg 1 tab PO Q D  Hyperthyroidism - recently  increased Methimazole to 10 mg BID  RLS - stablecontinue Mirapex 1.5 mg BID  Psychosis - continue Risperidone 0.25 mg Q HS   Flatulence - continue Devrom 200 mg daily  Migraine headache - continue Topamax 25 mg 1 tab daily  Vaginal atrophy - Premarin 0.625 mg/gm apply 0.5 gm intravaginally  2X/week    Goals of care:  Long-term care    [facility], NP [facility] [phone]

## 2016-03-22 ENCOUNTER — Ambulatory Visit: Payer: Medicare HMO

## 2016-03-23 ENCOUNTER — Other Ambulatory Visit: Payer: Self-pay

## 2016-03-23 LAB — BASIC METABOLIC PANEL
BUN: 32 mg/dL — AB (ref 4–21)
CREATININE: 1.4 mg/dL — AB (ref 0.5–1.1)
Glucose: 113 mg/dL
POTASSIUM: 3.5 mmol/L (ref 3.4–5.3)
Sodium: 139 mmol/L (ref 137–147)

## 2016-03-23 MED ORDER — HYDROCODONE-ACETAMINOPHEN 5-325 MG PO TABS: 1.0000 | ORAL_TABLET | ORAL | Status: DC | PRN

## 2016-03-23 NOTE — Telephone Encounter (Signed)
Rx fax to [facility]  Phone: [phone]  Fax : [phone]  

## 2016-04-03 ENCOUNTER — Encounter: Payer: Self-pay | Admitting: Adult Health

## 2016-04-03 ENCOUNTER — Non-Acute Institutional Stay (SKILLED_NURSING_FACILITY): Payer: Medicare Other | Admitting: Adult Health

## 2016-04-03 DIAGNOSIS — Z7901 Long term (current) use of anticoagulants: Secondary | ICD-10-CM | POA: Diagnosis not present

## 2016-04-03 DIAGNOSIS — Z86711 Personal history of pulmonary embolism: Secondary | ICD-10-CM | POA: Diagnosis not present

## 2016-04-03 LAB — BASIC METABOLIC PANEL
BUN: 29 mg/dL — AB (ref 4–21)
Creatinine: 1.3 mg/dL — AB (ref 0.5–1.1)
Glucose: 100 mg/dL
Potassium: 3.8 mmol/L (ref 3.4–5.3)
SODIUM: 139 mmol/L (ref 137–147)

## 2016-04-03 NOTE — Progress Notes (Signed)
Patient ID: Katie Roseamela Evans, female   DOB: [birth year], 70 y.o.   MRN: [id number] Subjective:     Indication: PE Bleeding signs/symptoms: None Thromboembolic signs/symptoms: None  Missed Coumadin doses: None Medication changes: no Dietary changes: no Bacterial/viral infection: no Other concerns: no  The following portions of the patient's history were reviewed and updated as appropriate: allergies, current medications, past family history, past medical history, past social history, past surgical history and problem list.  Review of Systems A comprehensive review of systems was negative.   Objective:    INR Today: 1.9 Current dose: Coumadin 2.5 mg daily     Assessment:    Subtherapeutic INR for goal of 2-3   Plan:    1. New dose: Increase Coumadin to 3 mg PO Q Tuesdays and Coumadin 2.5 mg PO Q Sun, Sat, Mon, Wed, Thurs and Fri   2. Next INR: 04/06/16

## 2016-04-09 LAB — CBC AND DIFFERENTIAL
HCT: 32 % — AB (ref 36–46)
HEMOGLOBIN: 9.6 g/dL — AB (ref 12.0–16.0)
NEUTROS ABS: 3 /uL
PLATELETS: 161 10*3/uL (ref 150–399)
WBC: 5.9 10^3/mL

## 2016-04-10 LAB — TSH: TSH: 3.37 u[IU]/mL (ref 0.41–5.90)

## 2016-04-16 ENCOUNTER — Ambulatory Visit
Admission: RE | Admit: 2016-04-16 | Discharge: 2016-04-16 | Disposition: A | Discharge status: Home / Self Care | Payer: Medicare Other | Source: Ambulatory Visit | Attending: Internal Medicine | Admitting: Internal Medicine

## 2016-04-16 DIAGNOSIS — Z1231 Encounter for screening mammogram for malignant neoplasm of breast: Secondary | ICD-10-CM

## 2016-04-17 ENCOUNTER — Encounter: Payer: Self-pay | Admitting: Adult Health

## 2016-04-17 ENCOUNTER — Non-Acute Institutional Stay (SKILLED_NURSING_FACILITY): Payer: Medicare Other | Admitting: Adult Health

## 2016-04-17 DIAGNOSIS — Z86711 Personal history of pulmonary embolism: Secondary | ICD-10-CM | POA: Diagnosis not present

## 2016-04-17 DIAGNOSIS — Z7901 Long term (current) use of anticoagulants: Secondary | ICD-10-CM | POA: Diagnosis not present

## 2016-04-17 NOTE — Progress Notes (Signed)
Patient ID: Katie Evans, female   DOB: 1946-10-25, 70 y.o.   MRN: [id number] Subjective:     Indication: PE Bleeding signs/symptoms: None Thromboembolic signs/symptoms: None  Missed Coumadin doses: None Medication changes: no Dietary changes: no Bacterial/viral infection: no Other concerns: no  The following portions of the patient's history were reviewed and updated as appropriate: allergies, current medications, past family history, past medical history, past social history, past surgical history and problem list.  Review of Systems A comprehensive review of systems was negative.   Objective:    INR Today: 1.4 Current dose: Coumadin 3 mg PO Q Tuesday and 2.5 mg  Q D except Tuesday     Assessment:    Subtherapeutic INR for goal of 2-3   Plan:    1. New dose: Coumadin 5 mg PO X 1 today then Coumadin 3 mg PO daily    2. Next INR: 04/20/16

## 2016-04-20 ENCOUNTER — Non-Acute Institutional Stay (SKILLED_NURSING_FACILITY): Payer: Medicare Other | Admitting: Adult Health

## 2016-04-20 ENCOUNTER — Encounter: Payer: Self-pay | Admitting: Adult Health

## 2016-04-20 DIAGNOSIS — Z7901 Long term (current) use of anticoagulants: Secondary | ICD-10-CM

## 2016-04-20 DIAGNOSIS — Z86711 Personal history of pulmonary embolism: Secondary | ICD-10-CM | POA: Diagnosis not present

## 2016-04-20 NOTE — Progress Notes (Signed)
Patient ID: Katie Evans, female   DOB: [birth year], 70 y.o.   MRN: [id number] Subjective:     Indication: PE Bleeding signs/symptoms: None Thromboembolic signs/symptoms: None  Missed Coumadin doses: None Medication changes: no Dietary changes: no Bacterial/viral infection: no Other concerns: no  The following portions of the patient's history were reviewed and updated as appropriate: allergies, current medications, past family history, past medical history, past social history, past surgical history and problem list.  Review of Systems A comprehensive review of systems was negative.   Objective:    INR Today: 1.2 Current dose: Coumadin 3 mg daily     Assessment:    Subtherapeutic INR for goal of 2-3   Plan:    1. New dose: Discontinue Coumadin 3 mg daily, give Coumadin 10 mg PO X 1 today then start Coumadin 4 mg daily   2. Next INR: 04/24/16

## 2016-04-24 ENCOUNTER — Non-Acute Institutional Stay (SKILLED_NURSING_FACILITY): Payer: Medicare Other | Admitting: Adult Health

## 2016-04-24 ENCOUNTER — Encounter: Payer: Self-pay | Admitting: Adult Health

## 2016-04-24 DIAGNOSIS — I5032 Chronic diastolic (congestive) heart failure: Secondary | ICD-10-CM

## 2016-04-24 DIAGNOSIS — G2581 Restless legs syndrome: Secondary | ICD-10-CM

## 2016-04-24 DIAGNOSIS — N3281 Overactive bladder: Secondary | ICD-10-CM

## 2016-04-24 DIAGNOSIS — K219 Gastro-esophageal reflux disease without esophagitis: Secondary | ICD-10-CM | POA: Diagnosis not present

## 2016-04-24 DIAGNOSIS — Z86711 Personal history of pulmonary embolism: Secondary | ICD-10-CM

## 2016-04-24 DIAGNOSIS — M170 Bilateral primary osteoarthritis of knee: Secondary | ICD-10-CM | POA: Diagnosis not present

## 2016-04-24 DIAGNOSIS — F29 Unspecified psychosis not due to a substance or known physiological condition: Secondary | ICD-10-CM

## 2016-04-24 DIAGNOSIS — G43909 Migraine, unspecified, not intractable, without status migrainosus: Secondary | ICD-10-CM

## 2016-04-24 DIAGNOSIS — K529 Noninfective gastroenteritis and colitis, unspecified: Secondary | ICD-10-CM

## 2016-04-24 DIAGNOSIS — E876 Hypokalemia: Secondary | ICD-10-CM

## 2016-04-24 DIAGNOSIS — I10 Essential (primary) hypertension: Secondary | ICD-10-CM

## 2016-04-24 DIAGNOSIS — D62 Acute posthemorrhagic anemia: Secondary | ICD-10-CM

## 2016-04-24 DIAGNOSIS — F329 Major depressive disorder, single episode, unspecified: Secondary | ICD-10-CM

## 2016-04-24 DIAGNOSIS — E0590 Thyrotoxicosis, unspecified without thyrotoxic crisis or storm: Secondary | ICD-10-CM | POA: Diagnosis not present

## 2016-04-24 DIAGNOSIS — N952 Postmenopausal atrophic vaginitis: Secondary | ICD-10-CM | POA: Diagnosis not present

## 2016-04-24 DIAGNOSIS — R143 Flatulence: Secondary | ICD-10-CM

## 2016-04-24 DIAGNOSIS — F419 Anxiety disorder, unspecified: Secondary | ICD-10-CM

## 2016-04-24 DIAGNOSIS — A4902 Methicillin resistant Staphylococcus aureus infection, unspecified site: Secondary | ICD-10-CM

## 2016-04-24 DIAGNOSIS — J309 Allergic rhinitis, unspecified: Secondary | ICD-10-CM

## 2016-04-24 DIAGNOSIS — E785 Hyperlipidemia, unspecified: Secondary | ICD-10-CM

## 2016-04-24 DIAGNOSIS — F32A Depression, unspecified: Secondary | ICD-10-CM

## 2016-04-24 NOTE — Progress Notes (Addendum)
Patient ID: Katie Evans, female   DOB: September 25, 1946, 70 y.o.   MRN: [id number]    DATE:    04/24/16  MRN:  [id number]  BIRTHDAY: 04-24-1946  Facility:  Nursing Home Location:  [facility]  Nursing Home Room Number: 1205-P  LEVEL OF CARE:  [phone])  Contact Information    Name Relation Home Work [place] Sister [phone]   [place] Plan   [phone]       Code Status History    This patient does not have a recorded code status. Please follow your organizational policy for patients in this situation.      Chief Complaint  Patient presents with  . Medical management of chronic illnesses    HISTORY OF PRESENT ILLNESS:  This is a 70 year old female who is being seen for a routine visit. She is a long-term resident @ [address] and currently having rehabilitation. Was recently started on Tetracycline, Rifampin and ciprofloxacin for right knee wound with MRSA. Her Zoloft dosage was recently increased to 200 mg daily.  She has been re-admitted to [facility] today, 02/20/16 from [facility]. She has PMH of arthritis, carpal tunnel syndrome, CHF, hemorrhoids and hypertension. She has osteoarthritis of bilateral knees for which she had bilateral total knee arthroplasties on 02/14/16.  PAST MEDICAL HISTORY:  Past Medical History  Diagnosis Date  . Renal disorder   . CHF (congestive heart failure) (HCC)   . Arthritis   . Hypertension   . Carpal tunnel syndrome   . Metabolic encephalopathy   . Anemia   . Major depressive disorder (HCC)   . Thrombocytopathia (HCC)   . Demand ischemia of myocardium (HCC)   . Chronic pulmonary embolism (HCC)   . Protein calorie malnutrition (HCC)   . Allergic rhinitis   . Hyperlipidemia   . Hyperthyroidism   . Generalized anxiety disorder   . GERD without esophagitis   . Hypokalemia   . Overactive bladder   . Psychosis   . RLS (restless legs syndrome)   .  Chronic diarrhea   . Flatulence   . Bruising   . Long term current use of anticoagulant therapy   . Thrombocytopenia (HCC)      CURRENT MEDICATIONS: Reviewed  Patient's Medications  New Prescriptions   No medications on file  Previous Medications   ASPIRIN 81 MG CHEWABLE TABLET    Chew 81 mg by mouth daily.    ATORVASTATIN (LIPITOR) 40 MG TABLET    Take 40 mg by mouth at bedtime.   BISMUTH SUBGALLATE (DEVROM) 200 MG CHEW    Chew 1 tablet by mouth daily.    CHOLECALCIFEROL (D3-1000 PO)    Take 1 tablet by mouth daily.   CIPROFLOXACIN (CIPRO) 500 MG TABLET    Take 500 mg by mouth 2 (two) times daily.   CLONAZEPAM (KLONOPIN) 0.5 MG TABLET    Take 0.5 mg by mouth. Give 1 tablet qhs and 1 qd prn for anxiety/restlessness   DICLOFENAC SODIUM (VOLTAREN) 1 % GEL    Apply 2 g topically 2 (two) times daily as needed. Apply to bilateral shoulders and knees BID PRN   ESTROGENS, CONJUGATED, (PREMARIN) 0.625 MG TABLET    Place 0.625 mg vaginally 2 (two) times a week. Apply one-half gm intravaginally 2x weekly.   FAMOTIDINE (PEPCID) 20 MG TABLET    Take 20 mg by mouth daily.    FERROUS SULFATE (FEROSUL PO)    Take 325 mg by  mouth 2 (two) times daily.    HYDROCODONE-ACETAMINOPHEN (NORCO/VICODIN) 5-325 MG TABLET    Take 1-2 tablets by mouth every 4 (four) hours as needed for moderate pain.   HYPROMELLOSE (ARTIFICIAL TEARS OP)    Apply to eye 3 (three) times daily as needed. Both eyes   LOPERAMIDE (IMODIUM) 2 MG CAPSULE    Take 2 mg by mouth 4 (four) times daily as needed for diarrhea or loose stools.    LORATADINE (CLARITIN) 10 MG TABLET    Take 10 mg by mouth daily as needed.    METHIMAZOLE (TAPAZOLE) 5 MG TABLET    Take 10 mg by mouth daily. Take two (2) tabs to equal 10 mg PO BID   MULTIPLE VITAMINS-MINERALS (DECUBI-VITE) CAPS    Take 1 capsule by mouth daily.   NADOLOL (CORGARD) 40 MG TABLET    Take 40 mg by mouth daily.   NITROGLYCERIN (NITROSTAT) 0.4 MG SL TABLET    Place 0.4 mg under the tongue  every 5 (five) minutes as needed for chest pain. For 3 doses as needed for chest pain   NORTRIPTYLINE (PAMELOR) 25 MG CAPSULE    Take 25 mg by mouth at bedtime.   OXYBUTYNIN (DITROPAN) 5 MG TABLET    Take 5 mg by mouth 3 (three) times daily as needed for bladder spasms.   POLYCARBOPHIL (FIBERCON) 625 MG TABLET    Take 625 mg by mouth daily as needed (chronic diarrhea).    POTASSIUM CHLORIDE SA (K-DUR,KLOR-CON) 20 MEQ TABLET    Take 20 mEq by mouth 2 (two) times daily. Take with food.  Do not crush.  May dissolve.   PRAMIPEXOLE (MIRAPEX) 1.5 MG TABLET    Take 1.5 mg by mouth 2 (two) times daily.    RIFAMPIN (RIFADIN) 300 MG CAPSULE    Take 300 mg by mouth 2 (two) times daily. Take for 28 doses   RISPERIDONE (RISPERDAL) 0.25 MG TABLET    Take 0.125 mg by mouth at bedtime. Take 1/2 of a 0.25 mg tablet to = 0.125 mg daily   SERTRALINE (ZOLOFT) 100 MG TABLET    Take 200 mg by mouth daily. Take two 100 mg tablets to = 200 mg po QD for depression   TETRACYCLINE (ACHROMYCIN,SUMYCIN) 500 MG CAPSULE    Take 500 mg by mouth every 6 (six) hours.   TIZANIDINE (ZANAFLEX) 2 MG TABLET    Take 2 mg by mouth every 6 (six) hours as needed for muscle spasms.   TOPIRAMATE (TOPAMAX) 25 MG TABLET    Take 25 mg by mouth daily.   TORSEMIDE (DEMADEX) 20 MG TABLET    Take 20 mg by mouth daily.    WARFARIN SODIUM (COUMADIN PO)    Take 4 mg by mouth daily. Take a one time dose of 10 mg 04/20/16, and then start 4 mg tablet qd.   ZINC OXIDE (SECURA EXTRA PROTECTIVE EX)    Apply topically as needed. Apply to sacrum and buttocks  Modified Medications   No medications on file  Discontinued Medications   TIZANIDINE (ZANAFLEX) 4 MG TABLET    Take 2 mg by mouth every 6 (six) hours as needed for muscle spasms. Take 1/2 of a 4 mg tablet to = 2 mg q6h prn.   WARFARIN (COUMADIN) 3 MG TABLET    Take 3 mg by mouth daily. Take 5 mg x1 on 04-17-16, then take 3 mg qd.  Repeat INR 04/20/16     Allergies  Allergen Reactions  . Remeron  [  Mirtazapine]      REVIEW OF SYSTEMS:  GENERAL: no change in appetite, no fatigue, no weight changes, no fever, chills or weakness EYES: Denies change in vision, dry eyes, eye pain, itching or discharge EARS: Denies change in hearing, ringing in ears, or earache NOSE: Denies nasal congestion or epistaxis MOUTH and THROAT: Denies oral discomfort, gingival pain or bleeding, pain from teeth or hoarseness   RESPIRATORY: no cough, SOB, DOE, wheezing, hemoptysis CARDIAC: no chest pain, or palpitations GI: no abdominal pain, diarrhea, constipation, heart burn, nausea or vomiting GU: Denies dysuria, frequency, hematuria, incontinence, or discharge PSYCHIATRIC: Denies feeling of depression or anxiety. No report of hallucinations, insomnia, paranoia, or agitation   PHYSICAL EXAMINATION  GENERAL APPEARANCE: Well nourished. In no acute distress. Obese SKIN:  Right knee surgical incision has open wound  Draining serous drainage and covered with dry dressing, left knee surgical incision  is dry/healed HEAD: Normal in size and contour. No evidence of trauma EYES: Lids open and close normally. No blepharitis, entropion or ectropion. PERRL. Conjunctivae are clear and sclerae are white. Lenses are without opacity EARS: Pinnae are normal. Patient hears normal voice tunes of the examiner MOUTH and THROAT: Lips are without lesions. Oral mucosa is moist and without lesions. Tongue is normal in shape, size, and color and without lesions NECK: supple, trachea midline, no neck masses, no thyroid tenderness, no thyromegaly LYMPHATICS: no LAN in the neck, no supraclavicular LAN RESPIRATORY: breathing is even & unlabored, BS CTAB CARDIAC: RRR, no murmur,no extra heart sounds GI: abdomen soft, normal BS, no masses, no tenderness, no hepatomegaly, no splenomegaly EXTREMITIES:  Able to move X 4 extremities PSYCHIATRIC: Alert and oriented X 3. Affect and behavior are appropriate  LABS/RADIOLOGY: Labs  reviewed: Basic Metabolic Panel:  Recent Labs  81/19/14 1029  02/24/16 03/23/16 04/03/16  NA 135  < > 139 139 139  K 3.7  < > 3.7 3.5 3.8  CL 102  --   --   --   --   CO2 27  --   --   --   --   GLUCOSE 103*  --   --   --   --   BUN 26*  < > 35* 32* 29*  CREATININE 1.03*  < > 1.3* 1.4* 1.3*  CALCIUM 9.4  --   --   --   --   < > = values in this interval not displayed. Liver Function Tests:  Recent Labs  10/12/15 02/22/16 02/24/16  AST 25 18 16   ALT 23 11 11   ALKPHOS 212* 138* 145*   CBC:  Recent Labs  05/10/15 1029  02/24/16 02/28/16 04/09/16  WBC 7.2  < > 7.9 7.5 5.9  NEUTROABS 3.9  --  5 5 3   HGB 9.7*  < > 8.6* 8.4* 9.6*  HCT 30.5*  < > 28* 28* 32*  MCV 87.1  --   --   --   --   PLT 157  < > 200 282 161  < > = values in this interval not displayed.    ASSESSMENT/PLAN:  Osteoarthritis S/P total bilateral knee replacement - continue rehabilitation; continue oxycodone 5 mg 1-2 tabs by mouth every 4 hours when necessary for pain; Zanaflex 4 mg 1 tab by mouth every 6 hours when necessary for muscle spasm; Coumadin for DVT prophylaxis; BLE WBAT;  follow-up with orthopedics, Dr. Denyce Robert Pill  Right knee wound with MRSA - continue Tetracycline, Cipro and Rifampin, continue Florastor 250 mg  1 capsule PO BID X 20 days; contact precautions, currently in a private room; continue wound treatment daily  Hx of pulmonary embolism - no SOB; continue Coumadin   Allergic rhinitis - continue Claritin 10 mg 1 tab PO daily PRN  Chronic diastolic CHF - continue Demadex 20 mg to 1 tab by mouth BID ; weigh Q Mondays and Wednesdays; continue 1800 ml fluid restriction  Hypertension - well controlled; continue Nadolol 40 mg by mouth daily  Hyperlipidemia - continue Lipitor 40 mg by mouth daily at bedtime Lab Results  Component Value Date   CHOL 70 02/22/2016   HDL 29* 02/22/2016   LDLCALC 27 02/22/2016   TRIG 72 02/22/2016    Anxiety - mood is is stable; continue Klonopin  0.5 mg by mouth Q HS and 0.5 mg Q D PRN   Depression - mood is stable; recently increased Zoloft from 150 mg  to 200 mg by mouth daily and continue Pamelor 25 mg PO Q HS; follows-up with [facility] for psychiatric care  Chronic diarrhea -hx of gastric bypass surgery; continue Imodium 2 mg by mouth 4 times a day when necessary  Hypokalemia - K 3.8 ; continue KCl 20 MEQ ER by mouth twice a day  Anemia, acute blood loss  - hgb 9.5 continue ferrous sulfate 325 mg by mouth BID  Overactive Bladder - continue Oxybutynin 5 mg 1 tab PO TID PRN  GERD - continue Pepcid 20 mg 1 tab PO Q D  Hyperthyroidism - recently  increased Methimazole to 10 mg BID Lab Results  Component Value Date   TSH 3.37 04/10/2016   RLS - stable; continue Mirapex 1.5 mg BID  Psychosis - continue Risperidone 0.25 mg Q HS   Flatulence - continue Devrom 200 mg daily  Migraine headache - continue Topamax 25 mg 1 tab daily  Vaginal atrophy - Premarin 0.625 mg/gm apply 0.5 gm intravaginally 2X/week    Goals of care:  Long-term care    Kenard GowerMonina Medina-Vargas, NP [facility] [phone]

## 2016-04-30 ENCOUNTER — Encounter: Payer: Self-pay | Admitting: Adult Health

## 2016-04-30 ENCOUNTER — Non-Acute Institutional Stay (SKILLED_NURSING_FACILITY): Payer: Medicare Other | Admitting: Adult Health

## 2016-04-30 DIAGNOSIS — I2782 Chronic pulmonary embolism: Secondary | ICD-10-CM | POA: Diagnosis not present

## 2016-04-30 DIAGNOSIS — Z7901 Long term (current) use of anticoagulants: Secondary | ICD-10-CM

## 2016-04-30 NOTE — Progress Notes (Signed)
Patient ID: Katie Evans, female   DOB: 10-Nov-1946, 70 y.o.   MRN: [id number] Subjective:     Indication: PE Bleeding signs/symptoms: None Thromboembolic signs/symptoms: None  Missed Coumadin doses: None Medication changes: no Dietary changes: no Bacterial/viral infection: yes - MRSA Infection Other concerns: no  The following portions of the patient's history were reviewed and updated as appropriate: allergies, current medications, past family history, past medical history, past social history, past surgical history and problem list.  Review of Systems A comprehensive review of systems was negative.   Objective:    INR Today: 1.6 Current dose: Coumadin 5 mg daily     Assessment:    Subtherapeutic INR for goal of 2-3   Plan:    1. New dose: Increase Coumadin to 5.5 mg daily   2. Next INR: 05/04/16

## 2016-05-04 ENCOUNTER — Non-Acute Institutional Stay (SKILLED_NURSING_FACILITY): Payer: Medicare Other | Admitting: Adult Health

## 2016-05-04 ENCOUNTER — Encounter: Payer: Self-pay | Admitting: Adult Health

## 2016-05-04 DIAGNOSIS — Z7901 Long term (current) use of anticoagulants: Secondary | ICD-10-CM

## 2016-05-04 DIAGNOSIS — I2782 Chronic pulmonary embolism: Secondary | ICD-10-CM | POA: Diagnosis not present

## 2016-05-04 NOTE — Progress Notes (Signed)
Patient ID: Katie Evans, female   DOB: [birth year], 70 y.o.   MRN: [id number] Subjective:     Indication: PE Bleeding signs/symptoms: None Thromboembolic signs/symptoms: None  Missed Coumadin doses: None Medication changes: no Dietary changes: no Bacterial/viral infection: yes - MRSA of right knee wound Other concerns: no  The following portions of the patient's history were reviewed and updated as appropriate: allergies, current medications, past family history, past medical history, past social history, past surgical history and problem list.  Review of Systems A comprehensive review of systems was negative.   Objective:    INR Today: 1.4 Current dose: Coumadin 5.5 mg daily     Assessment:    Subtherapeutic INR for goal of 2-3   Plan:    1. New dose: Increase Coumadin to 6 mg daily   2. Next INR: 05/08/16

## 2016-05-09 ENCOUNTER — Other Ambulatory Visit: Payer: Self-pay | Admitting: *Deleted

## 2016-05-09 MED ORDER — LORAZEPAM 2 MG/ML IJ SOLN: INTRAMUSCULAR | Status: DC

## 2016-05-09 NOTE — Telephone Encounter (Signed)
[facility] 

## 2016-05-11 ENCOUNTER — Encounter: Payer: Self-pay | Admitting: Adult Health

## 2016-05-11 ENCOUNTER — Non-Acute Institutional Stay (SKILLED_NURSING_FACILITY): Payer: Medicare Other | Admitting: Adult Health

## 2016-05-11 DIAGNOSIS — Z7901 Long term (current) use of anticoagulants: Secondary | ICD-10-CM

## 2016-05-11 DIAGNOSIS — I2782 Chronic pulmonary embolism: Secondary | ICD-10-CM | POA: Diagnosis not present

## 2016-05-11 NOTE — Progress Notes (Signed)
Patient ID: Katie Evans, female   DOB: [birth year], 70 y.o.   MRN: [id number] Subjective:     Indication: PE Bleeding signs/symptoms: None Thromboembolic signs/symptoms: None  Missed Coumadin doses: None Medication changes: no Dietary changes: no Bacterial/viral infection: yes - MRSA of right knee Other concerns: no  The following portions of the patient's history were reviewed and updated as appropriate: allergies, current medications, past family history, past medical history, past social history, past surgical history and problem list.  Review of Systems A comprehensive review of systems was negative.   Objective:    INR Today: 1.6 Current dose: Coumadin 7.5 mg     Assessment:    Subtherapeutic INR for goal of 2-3   Plan:    1. New dose: Increase Coumadin to 8 mg daily   2. Next INR: 05/15/16

## 2016-05-15 ENCOUNTER — Encounter: Payer: Self-pay | Admitting: Adult Health

## 2016-05-15 ENCOUNTER — Non-Acute Institutional Stay (SKILLED_NURSING_FACILITY): Payer: Medicare Other | Admitting: Adult Health

## 2016-05-15 DIAGNOSIS — I2782 Chronic pulmonary embolism: Secondary | ICD-10-CM | POA: Diagnosis not present

## 2016-05-15 DIAGNOSIS — Z7901 Long term (current) use of anticoagulants: Secondary | ICD-10-CM

## 2016-05-15 NOTE — Progress Notes (Signed)
Patient ID: Katie Evans, female   DOB: 1946-09-27, 70 y.o.   MRN: [id number] Subjective:     Indication: PE Bleeding signs/symptoms: None Thromboembolic signs/symptoms: None  Missed Coumadin doses: None Medication changes: no Dietary changes: no Bacterial/viral infection: yes - MRSA of right knee Other concerns: no  The following portions of the patient's history were reviewed and updated as appropriate: allergies, current medications, past family history, past medical history, past social history, past surgical history and problem list.  Review of Systems A comprehensive review of systems was negative.   Objective:    INR Today: 1.6 - no change in INR even with Coumadin dosage increased 4 days ago Current dose: Coumadin 8 mg     Assessment:    Subtherapeutic INR for goal of 2-3   Plan:    1. New dose: Give Coumadin 10 mg PO X 1 today then increase Coumadin to 9 mg daily   2. Next INR: 05/18/16

## 2016-05-21 ENCOUNTER — Non-Acute Institutional Stay (SKILLED_NURSING_FACILITY): Payer: Medicare Other | Admitting: Adult Health

## 2016-05-21 ENCOUNTER — Encounter: Payer: Self-pay | Admitting: Adult Health

## 2016-05-21 DIAGNOSIS — F29 Unspecified psychosis not due to a substance or known physiological condition: Secondary | ICD-10-CM

## 2016-05-21 DIAGNOSIS — K529 Noninfective gastroenteritis and colitis, unspecified: Secondary | ICD-10-CM | POA: Diagnosis not present

## 2016-05-21 DIAGNOSIS — E876 Hypokalemia: Secondary | ICD-10-CM

## 2016-05-21 DIAGNOSIS — E785 Hyperlipidemia, unspecified: Secondary | ICD-10-CM

## 2016-05-21 DIAGNOSIS — G2581 Restless legs syndrome: Secondary | ICD-10-CM

## 2016-05-21 DIAGNOSIS — J309 Allergic rhinitis, unspecified: Secondary | ICD-10-CM | POA: Diagnosis not present

## 2016-05-21 DIAGNOSIS — I10 Essential (primary) hypertension: Secondary | ICD-10-CM | POA: Diagnosis not present

## 2016-05-21 DIAGNOSIS — M25511 Pain in right shoulder: Secondary | ICD-10-CM

## 2016-05-21 DIAGNOSIS — N952 Postmenopausal atrophic vaginitis: Secondary | ICD-10-CM | POA: Diagnosis not present

## 2016-05-21 DIAGNOSIS — F329 Major depressive disorder, single episode, unspecified: Secondary | ICD-10-CM | POA: Diagnosis not present

## 2016-05-21 DIAGNOSIS — E0590 Thyrotoxicosis, unspecified without thyrotoxic crisis or storm: Secondary | ICD-10-CM | POA: Diagnosis not present

## 2016-05-21 DIAGNOSIS — G43909 Migraine, unspecified, not intractable, without status migrainosus: Secondary | ICD-10-CM

## 2016-05-21 DIAGNOSIS — K219 Gastro-esophageal reflux disease without esophagitis: Secondary | ICD-10-CM

## 2016-05-21 DIAGNOSIS — I25119 Atherosclerotic heart disease of native coronary artery with unspecified angina pectoris: Secondary | ICD-10-CM

## 2016-05-21 DIAGNOSIS — I5032 Chronic diastolic (congestive) heart failure: Secondary | ICD-10-CM | POA: Diagnosis not present

## 2016-05-21 DIAGNOSIS — G629 Polyneuropathy, unspecified: Secondary | ICD-10-CM

## 2016-05-21 DIAGNOSIS — R143 Flatulence: Secondary | ICD-10-CM

## 2016-05-21 DIAGNOSIS — M25512 Pain in left shoulder: Secondary | ICD-10-CM

## 2016-05-21 DIAGNOSIS — M170 Bilateral primary osteoarthritis of knee: Secondary | ICD-10-CM | POA: Diagnosis not present

## 2016-05-21 DIAGNOSIS — F419 Anxiety disorder, unspecified: Secondary | ICD-10-CM

## 2016-05-21 DIAGNOSIS — N3281 Overactive bladder: Secondary | ICD-10-CM

## 2016-05-21 DIAGNOSIS — I2782 Chronic pulmonary embolism: Secondary | ICD-10-CM

## 2016-05-21 DIAGNOSIS — D62 Acute posthemorrhagic anemia: Secondary | ICD-10-CM

## 2016-05-21 DIAGNOSIS — F32A Depression, unspecified: Secondary | ICD-10-CM

## 2016-05-21 DIAGNOSIS — B372 Candidiasis of skin and nail: Secondary | ICD-10-CM

## 2016-05-21 NOTE — Progress Notes (Addendum)
Patient ID: Katie Evans, female   DOB: 08/28/1946, 70 y.o.   MRN: [id number]    DATE:    05/21/16  MRN:  [id number]  BIRTHDAY: 08/28/1946  Facility:  Nursing Home Location:  [facility]  Nursing Home Room Number: 1005-B  LEVEL OF CARE:  SNF [phone]  Contact Information    Name Relation Home Work [place]   Grast,Lauran Sister [phone]   Fuller Planaylor,Dana Sister   [phone]       Code Status History    This patient does not have a recorded code status. Please follow your organizational policy for patients in this situation.      Chief Complaint  Patient presents with  . Medical management of chronic illnesses    HISTORY OF PRESENT ILLNESS:  This is a 70 year old female who is being seen for a routine visit. She is a long-term resident @ [address] [facility]. She has recently been transitioned from PT to RNP to prevent decline. She had a recent bilateral knee arthroplasties. She was cultured +MRSA on right knee wound drainage and had just finished Tetracycline course. Repeat culture of right knee wound drainage is negative for MRSA.    She was noted to have erythematous rashes on bilateral abdominal folds and bilateral groins.  [facility] has been [facility], [facility]. She has PMH of arthritis, carpal tunnel syndrome, CHF, hemorrhoids and hypertension. She has osteoarthritis of bilateral knees for which she had bilateral total knee arthroplasties on 02/14/16.  PAST MEDICAL HISTORY:  Past Medical History  Diagnosis Date  . Renal disorder   . CHF (congestive heart failure) (HCC)   . Arthritis   . Hypertension   . Carpal tunnel syndrome   . Metabolic encephalopathy   . Anemia   . Major depressive disorder (HCC)   . Thrombocytopathia (HCC)   . Demand ischemia of myocardium (HCC)   . Chronic pulmonary embolism (HCC)   . Protein calorie malnutrition (HCC)   . Allergic rhinitis   .  Hyperlipidemia   . Hyperthyroidism   . Generalized anxiety disorder   . GERD without esophagitis   . Hypokalemia   . Overactive bladder   . Psychosis   . RLS (restless legs syndrome)   . Chronic diarrhea   . Flatulence   . Bruising   . Long term current use of anticoagulant therapy   . Thrombocytopenia (HCC)      CURRENT MEDICATIONS: Reviewed    Medication List       This list is accurate as of: 05/21/16  4:56 PM.  Always use your most recent med list.               ARTIFICIAL TEARS OP  Apply to eye 3 (three) times daily as needed. Both eyes     aspirin 81 MG chewable tablet  Chew 81 mg by mouth daily.     clonazePAM 0.5 MG tablet  Commonly known as:  KLONOPIN  Take 0.5 mg by mouth daily.     clonazePAM 1 MG tablet  Commonly known as:  KLONOPIN  Take 1 mg by mouth at bedtime.     conjugated estrogens vaginal cream  Commonly known as:  PREMARIN  Place 1 Applicatorful vaginally 2 (two) times a week. On Wednesdays and Fridays Q 12 AM per patient request     COUMADIN PO  Take 9 mg by mouth daily.     D3-1000 PO  Take 1 tablet by mouth  daily.     DECUBI-VITE Caps  Take 1 capsule by mouth daily.     DEVROM 200 MG Chew  Generic drug:  Bismuth Subgallate  Chew 1 tablet by mouth daily.     diclofenac sodium 1 % Gel  Commonly known as:  VOLTAREN  Apply 2 g topically 2 (two) times daily as needed. Apply to bilateral shoulders and knees BID PRN     famotidine 20 MG tablet  Commonly known as:  PEPCID  Take 20 mg by mouth daily.     FEROSUL PO  Take 325 mg by mouth 2 (two) times daily.     gabapentin 100 MG capsule  Commonly known as:  NEURONTIN  Take 100 mg by mouth at bedtime.     HYDROcodone-acetaminophen 5-325 MG tablet  Commonly known as:  NORCO/VICODIN  Take 1-2 tablets by mouth every 4 (four) hours as needed for moderate pain.     LIPITOR 40 MG tablet  Generic drug:  atorvastatin  Take 40 mg by mouth at bedtime.     loperamide 2 MG capsule   Commonly known as:  IMODIUM  Take 2 mg by mouth 4 (four) times daily as needed for diarrhea or loose stools.     loratadine 10 MG tablet  Commonly known as:  CLARITIN  Take 10 mg by mouth daily as needed.     methimazole 10 MG tablet  Commonly known as:  TAPAZOLE  Take 10 mg by mouth 2 (two) times daily.     nadolol 40 MG tablet  Commonly known as:  CORGARD  Take 40 mg by mouth daily.     nitroGLYCERIN 0.4 MG SL tablet  Commonly known as:  NITROSTAT  Place 0.4 mg under the tongue every 5 (five) minutes as needed for chest pain. For 3 doses as needed for chest pain     nystatin cream  Commonly known as:  MYCOSTATIN  Apply 1 application topically 3 (three) times daily. Apply to bilateral groins and abdominal folds every shift x2 weeks     Nystatin Powd  Apply 1 application topically 3 (three) times daily. Apply 1B units powder to bilateral groins and abdominal folds each shift x2 weeks     oxybutynin 5 MG tablet  Commonly known as:  DITROPAN  Take 5 mg by mouth 3 (three) times daily as needed for bladder spasms.     polycarbophil 625 MG tablet  Commonly known as:  FIBERCON  Take 625 mg by mouth daily as needed (chronic diarrhea).     potassium chloride SA 20 MEQ tablet  Commonly known as:  K-DUR,KLOR-CON  Take 20 mEq by mouth 2 (two) times daily. Take with food.  Do not crush.  May dissolve.     pramipexole 1.5 MG tablet  Commonly known as:  MIRAPEX  Take 1.5 mg by mouth 2 (two) times daily.     risperiDONE 0.25 MG tablet  Commonly known as:  RISPERDAL  Take 0.125 mg by mouth at bedtime. Take 1/2 of a 0.25 mg tablet to = 0.125 mg daily     SECURA EXTRA PROTECTIVE EX  Apply topically as needed. Apply to sacrum and buttocks     sertraline 100 MG tablet  Commonly known as:  ZOLOFT  Take 200 mg by mouth daily. Take two 100 mg tablets to = 200 mg po QD for depression     shark liver oil-cocoa butter 0.25-3-85.5 % suppository  Commonly known as:  PREPARATION H  Place  1  suppository rectally 2 (two) times daily as needed for hemorrhoids.     tiZANidine 2 MG tablet  Commonly known as:  ZANAFLEX  Take 2 mg by mouth every 6 (six) hours as needed for muscle spasms.     topiramate 25 MG tablet  Commonly known as:  TOPAMAX  Take 25 mg by mouth daily.     torsemide 20 MG tablet  Commonly known as:  DEMADEX  Take 20 mg by mouth daily.          Allergies  Allergen Reactions  . Remeron [Mirtazapine]      REVIEW OF SYSTEMS:  GENERAL: no change in appetite, no fatigue, no weight changes, no fever, chills or weakness EYES: Denies change in vision, dry eyes, eye pain, itching or discharge EARS: Denies change in hearing, ringing in ears, or earache NOSE: Denies nasal congestion or epistaxis MOUTH and THROAT: Denies oral discomfort, gingival pain or bleeding, pain from teeth or hoarseness   RESPIRATORY: no cough, SOB, DOE, wheezing, hemoptysis CARDIAC: no chest pain, or palpitations GI: no abdominal pain, diarrhea, constipation, heart burn, nausea or vomiting GU: Denies dysuria, frequency, hematuria, incontinence, or discharge PSYCHIATRIC: Denies feeling of depression or anxiety. No report of hallucinations, insomnia, paranoia, or agitation   PHYSICAL EXAMINATION  GENERAL APPEARANCE: Well nourished. In no acute distress. Obese SKIN:  Right knee surgical incision has open wound  Is covered with dressing; erythematous rashes on bilateral groin and abdominal folds HEAD: Normal in size and contour. No evidence of trauma EYES: Lids open and close normally. No blepharitis, entropion or ectropion. PERRL. Conjunctivae are clear and sclerae are white. Lenses are without opacity EARS: Pinnae are normal. Patient hears normal voice tunes of the examiner MOUTH and THROAT: Lips are without lesions. Oral mucosa is moist and without lesions. Tongue is normal in shape, size, and color and without lesions NECK: supple, trachea midline, no neck masses, no thyroid  tenderness, no thyromegaly LYMPHATICS: no LAN in the neck, no supraclavicular LAN RESPIRATORY: breathing is even & unlabored, BS CTAB CARDIAC: RRR, no murmur,no extra heart sounds GI: abdomen soft, normal BS, no masses, no tenderness, no hepatomegaly, no splenomegaly EXTREMITIES:  Able to move X 4 extremities PSYCHIATRIC: Alert and oriented X 3. Affect and behavior are appropriate  LABS/RADIOLOGY: Labs reviewed: Basic Metabolic Panel:  Recent Labs  40/98/11 03/23/16 04/03/16  NA 139 139 139  K 3.7 3.5 3.8  BUN 35* 32* 29*  CREATININE 1.3* 1.4* 1.3*   Liver Function Tests:  Recent Labs  10/12/15 02/22/16 02/24/16  AST 25 18 16   ALT 23 11 11   ALKPHOS 212* 138* 145*   CBC:  Recent Labs  02/24/16 02/28/16 04/09/16  WBC 7.9 7.5 5.9  NEUTROABS 5 5 3   HGB 8.6* 8.4* 9.6*  HCT 28* 28* 32*  PLT 200 282 161      ASSESSMENT/PLAN:  CAD - stable; continue ASA 81 mg EC 1 tab daily and NTG PRN  Neuropathy - recently started on Neurontin 100 mg 1 capsule PO Q HS  Candidal skin infection - Start nystatin cream 100,000 units/gram to bilateral groin and abdominal folds every shift 2 weeks and nystatin powder 100,000 units/gram to bilateral groin and abdominal folds every shift 2 weeks    Osteoarthritis S/P total bilateral knee replacement - continue rehabilitation; continue Norco 5/325 mg 1-2 tabs by mouth every 4 hours when necessary,  for pain; Zanaflex 2 mg 1 tab by mouth every 6 hours when necessary for muscle spasm; Coumadin for  DVT prophylaxis; BLE WBAT;  follow-up with orthopedics, Dr. Denyce RobertStephen Geoffrey Pill  Hx of pulmonary embolism - no SOB; continue Coumadin   Allergic rhinitis - continue Claritin 10 mg 1 tab PO daily PRN  Chronic diastolic CHF - continue Demadex 20 mg to 1 tab by mouth daily ; weigh Q Mondays and Wednesdays; continue 1800 ml fluid restriction; check CMP  Hypertension - well controlled; continue Nadolol 40 mg by mouth daily  Hyperlipidemia - continue  Lipitor 40 mg by mouth daily at bedtime Lab Results  Component Value Date   CHOL 70 02/22/2016   HDL 29* 02/22/2016   LDLCALC 27 02/22/2016   TRIG 72 02/22/2016    Anxiety - mood is stable; recently increased Klonopin to 1 mg by mouth Q HS and 0.5 mg Q D PRN   Depression - mood is stable; continue Zoloft  200 mg by mouth daily and recently discontinued Pamelor ; follows-up with [facility] for psychiatric care  Chronic diarrhea -hx of gastric bypass surgery; continue Imodium 2 mg by mouth 4 times a day when necessary  Hypokalemia -  continue KCl 20 MEQ ER by mouth twice a day Lab Results  Component Value Date   K 3.8 04/03/2016    Anemia, acute blood loss  - continue ferrous sulfate 325 mg by mouth BID; check CBC Lab Results  Component Value Date   WBC 5.9 04/09/2016   HGB 9.6* 04/09/2016   HCT 32* 04/09/2016   MCV 87.1 05/10/2015   PLT 161 04/09/2016    Overactive Bladder - continue Oxybutynin 5 mg 1 tab PO TID PRN  GERD - continue Pepcid 20 mg 1 tab PO Q D  Hyperthyroidism - continue Methimazole to 10 mg BID Lab Results  Component Value Date   TSH 3.37 04/10/2016   RLS - stable; continue Mirapex 1.5 mg BID  Psychosis - continue Risperidone 0.25 mg  Take 1/2 tab = 0.125 mg PO Q HS   Flatulence - continue Devrom 200 mg daily  Migraine headache - continue Topamax 25 mg 1 tab daily  Vaginal atrophy - Premarin 0.625 mg/gm apply 0.5 gm intravaginally 2X/week  Bilateral shoulder pain - continue Voltare 1% gel 2 gm to bilateral shoulders topically BID PRN    Goals of care:  Long-term care    Kenard GowerMonina Medina-Vargas, NP [facility] [phone]

## 2016-05-30 ENCOUNTER — Other Ambulatory Visit: Payer: Self-pay | Admitting: *Deleted

## 2016-05-30 MED ORDER — CLONAZEPAM 0.5 MG PO TABS: 0.5000 mg | ORAL_TABLET | Freq: Every day | ORAL | Status: DC

## 2016-06-11 ENCOUNTER — Other Ambulatory Visit: Payer: Self-pay | Admitting: Internal Medicine

## 2016-06-11 DIAGNOSIS — M25511 Pain in right shoulder: Secondary | ICD-10-CM

## 2016-06-12 ENCOUNTER — Telehealth: Payer: Self-pay | Admitting: *Deleted

## 2016-06-12 ENCOUNTER — Encounter: Payer: Self-pay | Admitting: Adult Health

## 2016-06-12 ENCOUNTER — Non-Acute Institutional Stay (SKILLED_NURSING_FACILITY): Payer: Medicare Other | Admitting: Adult Health

## 2016-06-12 DIAGNOSIS — R143 Flatulence: Secondary | ICD-10-CM

## 2016-06-12 DIAGNOSIS — I25119 Atherosclerotic heart disease of native coronary artery with unspecified angina pectoris: Secondary | ICD-10-CM

## 2016-06-12 DIAGNOSIS — E0590 Thyrotoxicosis, unspecified without thyrotoxic crisis or storm: Secondary | ICD-10-CM | POA: Diagnosis not present

## 2016-06-12 DIAGNOSIS — M19011 Primary osteoarthritis, right shoulder: Secondary | ICD-10-CM | POA: Diagnosis not present

## 2016-06-12 DIAGNOSIS — J309 Allergic rhinitis, unspecified: Secondary | ICD-10-CM

## 2016-06-12 DIAGNOSIS — G629 Polyneuropathy, unspecified: Secondary | ICD-10-CM

## 2016-06-12 DIAGNOSIS — I10 Essential (primary) hypertension: Secondary | ICD-10-CM

## 2016-06-12 DIAGNOSIS — N3281 Overactive bladder: Secondary | ICD-10-CM

## 2016-06-12 DIAGNOSIS — F419 Anxiety disorder, unspecified: Secondary | ICD-10-CM

## 2016-06-12 DIAGNOSIS — D62 Acute posthemorrhagic anemia: Secondary | ICD-10-CM

## 2016-06-12 DIAGNOSIS — M170 Bilateral primary osteoarthritis of knee: Secondary | ICD-10-CM

## 2016-06-12 DIAGNOSIS — E876 Hypokalemia: Secondary | ICD-10-CM | POA: Diagnosis not present

## 2016-06-12 DIAGNOSIS — F329 Major depressive disorder, single episode, unspecified: Secondary | ICD-10-CM

## 2016-06-12 DIAGNOSIS — K529 Noninfective gastroenteritis and colitis, unspecified: Secondary | ICD-10-CM

## 2016-06-12 DIAGNOSIS — F32A Depression, unspecified: Secondary | ICD-10-CM

## 2016-06-12 DIAGNOSIS — Z7901 Long term (current) use of anticoagulants: Secondary | ICD-10-CM

## 2016-06-12 DIAGNOSIS — T148XXA Other injury of unspecified body region, initial encounter: Secondary | ICD-10-CM

## 2016-06-12 DIAGNOSIS — G43909 Migraine, unspecified, not intractable, without status migrainosus: Secondary | ICD-10-CM | POA: Diagnosis not present

## 2016-06-12 DIAGNOSIS — G2581 Restless legs syndrome: Secondary | ICD-10-CM

## 2016-06-12 DIAGNOSIS — K219 Gastro-esophageal reflux disease without esophagitis: Secondary | ICD-10-CM

## 2016-06-12 DIAGNOSIS — I2782 Chronic pulmonary embolism: Secondary | ICD-10-CM | POA: Diagnosis not present

## 2016-06-12 DIAGNOSIS — E785 Hyperlipidemia, unspecified: Secondary | ICD-10-CM

## 2016-06-12 DIAGNOSIS — N952 Postmenopausal atrophic vaginitis: Secondary | ICD-10-CM

## 2016-06-12 DIAGNOSIS — I5032 Chronic diastolic (congestive) heart failure: Secondary | ICD-10-CM

## 2016-06-12 DIAGNOSIS — F29 Unspecified psychosis not due to a substance or known physiological condition: Secondary | ICD-10-CM

## 2016-06-12 DIAGNOSIS — T148 Other injury of unspecified body region: Secondary | ICD-10-CM

## 2016-06-12 DIAGNOSIS — L089 Local infection of the skin and subcutaneous tissue, unspecified: Secondary | ICD-10-CM

## 2016-06-12 NOTE — Telephone Encounter (Signed)
Katie Evans with [facility] called with questions regarding an MRI that was ordered. Patient is not seen at our office. Given Katie Evans's number. She will give them a call to clarify order.

## 2016-06-12 NOTE — Progress Notes (Signed)
Patient ID: Katie Evans, female   DOB: Nov 29, 1945, 69 y.o.   MRN: [id number]    DATE:    06/12/16  MRN:  [id number]  BIRTHDAY: 06-Dec-1945  Facility:  Nursing Home Location:  [facility]  Nursing Home Room Number: 1005-B  LEVEL OF CARE:  SNF [phone])      Contact Information    Name Relation Home Work [place] Sister [phone]   Fuller Plan   [phone]       Code Status History    This patient does not have a recorded code status. Please follow your organizational policy for patients in this situation.      Chief Complaint  Patient presents with  . Medical management of chronic illnesses    HISTORY OF PRESENT ILLNESS:  This is a 70 year old female who is being seen for a routine visit. She is a long-term resident @ [address] Latest INR 3.1, supratherapeutic. No bleeding nor bruising noted. She takes Coumadin for hx of PE . No SOB. He was recently started on Doxycycline for right knee open wound. She was ordered to have MRI of right shoulder. Patient verbalized that she is not claustrophobic. She has been complaining of right shoulder pain. X-ray revealed that she has severe right shoulder osteoarthritis. She was given right shoulder injection on  05/14/16.  She is currently on RNP to prevent decline. She had a recent bilateral knee arthroplasties.    PAST MEDICAL HISTORY:  Past Medical History  Diagnosis Date  . Renal disorder   . CHF (congestive heart failure) (HCC)   . Arthritis   . Hypertension   . Carpal tunnel syndrome   . Metabolic encephalopathy   . Anemia   . Major depressive disorder (HCC)   . Thrombocytopathia (HCC)   . Demand ischemia of myocardium (HCC)   . Chronic pulmonary embolism (HCC)   . Protein calorie malnutrition (HCC)   . Allergic rhinitis   . Hyperlipidemia   . Hyperthyroidism   . Generalized anxiety disorder   . GERD without esophagitis   . Hypokalemia   .  Overactive bladder   . Psychosis   . RLS (restless legs syndrome)   . Chronic diarrhea   . Flatulence   . Bruising   . Long term current use of anticoagulant therapy   . Thrombocytopenia (HCC)   . Vaginal atrophy      CURRENT MEDICATIONS: Reviewed    Medication List       This list is accurate as of: 06/12/16  7:40 PM.  Always use your most recent med list.               ARTIFICIAL TEARS OP  Apply to eye 3 (three) times daily as needed. Both eyes     aspirin 81 MG chewable tablet  Chew 81 mg by mouth daily.     clonazePAM 1 MG tablet  Commonly known as:  KLONOPIN  Take 1 mg by mouth at bedtime.     clonazePAM 0.5 MG tablet  Commonly known as:  KLONOPIN  Take 0.5 mg by mouth daily as needed.     conjugated estrogens vaginal cream  Commonly known as:  PREMARIN  Place 1 Applicatorful vaginally 2 (two) times a week. On Wednesdays and Fridays Q 12 AM per patient request     COUMADIN PO  Take 5 mg by mouth daily.     D3-1000 PO  Take 1 tablet by mouth daily.  DECUBI-VITE Caps  Take 1 capsule by mouth daily.     DEVROM 200 MG Chew  Generic drug:  Bismuth Subgallate  Chew 1 tablet by mouth daily.     diclofenac sodium 1 % Gel  Commonly known as:  VOLTAREN  Apply 2 g topically 2 (two) times daily as needed. Apply to bilateral shoulders and knees BID PRN     doxycycline 100 MG tablet  Commonly known as:  VIBRA-TABS  Take 100 mg by mouth 2 (two) times daily.     famotidine 20 MG tablet  Commonly known as:  PEPCID  Take 20 mg by mouth daily.     FEROSUL PO  Take 325 mg by mouth 2 (two) times daily.     gabapentin 100 MG capsule  Commonly known as:  NEURONTIN  Take 100 mg by mouth at bedtime.     HYDROcodone-acetaminophen 5-325 MG tablet  Commonly known as:  NORCO/VICODIN  Take 1-2 tablets by mouth every 4 (four) hours as needed for moderate pain.     LIPITOR 40 MG tablet  Generic drug:  atorvastatin  Take 40 mg by mouth at bedtime.     loperamide  2 MG capsule  Commonly known as:  IMODIUM  Take 2 mg by mouth 4 (four) times daily as needed for diarrhea or loose stools.     loratadine 10 MG tablet  Commonly known as:  CLARITIN  Take 10 mg by mouth daily as needed.     methimazole 10 MG tablet  Commonly known as:  TAPAZOLE  Take 10 mg by mouth 2 (two) times daily.     nadolol 40 MG tablet  Commonly known as:  CORGARD  Take 40 mg by mouth daily.     nitroGLYCERIN 0.4 MG SL tablet  Commonly known as:  NITROSTAT  Place 0.4 mg under the tongue every 5 (five) minutes as needed for chest pain. For 3 doses as needed for chest pain     oxybutynin 5 MG tablet  Commonly known as:  DITROPAN  Take 5 mg by mouth 3 (three) times daily as needed for bladder spasms.     polycarbophil 625 MG tablet  Commonly known as:  FIBERCON  Take 625 mg by mouth daily as needed (chronic diarrhea).     potassium chloride SA 20 MEQ tablet  Commonly known as:  K-DUR,KLOR-CON  Take 20 mEq by mouth 2 (two) times daily. Take with food.  Do not crush.  May dissolve.     pramipexole 1.5 MG tablet  Commonly known as:  MIRAPEX  Take 1.5 mg by mouth 2 (two) times daily.     risperiDONE 0.25 MG tablet  Commonly known as:  RISPERDAL  Take 0.125 mg by mouth at bedtime. Take 1/2 of a 0.25 mg tablet to = 0.125 mg daily     SECURA EXTRA PROTECTIVE EX  Apply topically as needed. Apply to sacrum and buttocks     sertraline 100 MG tablet  Commonly known as:  ZOLOFT  Take 200 mg by mouth daily. Take two 100 mg tablets to = 200 mg po QD for depression     shark liver oil-cocoa butter 0.25-3-85.5 % suppository  Commonly known as:  PREPARATION H  Place 1 suppository rectally 2 (two) times daily as needed for hemorrhoids.     tiZANidine 2 MG tablet  Commonly known as:  ZANAFLEX  Take 2 mg by mouth every 6 (six) hours as needed for muscle spasms.  topiramate 25 MG tablet  Commonly known as:  TOPAMAX  Take 25 mg by mouth daily.     torsemide 20 MG tablet   Commonly known as:  DEMADEX  Take 20 mg by mouth daily.          Allergies  Allergen Reactions  . Remeron [Mirtazapine]      REVIEW OF SYSTEMS:  GENERAL: no change in appetite, no fatigue, no weight changes, no fever, chills or weakness EYES: Denies change in vision, dry eyes, eye pain, itching or discharge EARS: Denies change in hearing, ringing in ears, or earache NOSE: Denies nasal congestion or epistaxis MOUTH and THROAT: Denies oral discomfort, gingival pain or bleeding, pain from teeth or hoarseness   RESPIRATORY: no cough, SOB, DOE, wheezing, hemoptysis CARDIAC: no chest pain, or palpitations GI: no abdominal pain, diarrhea, constipation, heart burn, nausea or vomiting GU: Denies dysuria, frequency, hematuria, incontinence, or discharge PSYCHIATRIC: Denies feeling of depression or anxiety. No report of hallucinations, insomnia, paranoia, or agitation   PHYSICAL EXAMINATION  GENERAL APPEARANCE: Well nourished. In no acute distress. Obese SKIN:  Right knee surgical incision has open wound  Is covered with dressing; HEAD: Normal in size and contour. No evidence of trauma EYES: Lids open and close normally. No blepharitis, entropion or ectropion. PERRL. Conjunctivae are clear and sclerae are white. Lenses are without opacity EARS: Pinnae are normal. Patient hears normal voice tunes of the examiner MOUTH and THROAT: Lips are without lesions. Oral mucosa is moist and without lesions. Tongue is normal in shape, size, and color and without lesions NECK: supple, trachea midline, no neck masses, no thyroid tenderness, no thyromegaly LYMPHATICS: no LAN in the neck, no supraclavicular LAN RESPIRATORY: breathing is even & unlabored, BS CTAB CARDIAC: RRR, no murmur,no extra heart sounds, BLE edema 2+ GI: abdomen soft, normal BS, no masses, no tenderness, no hepatomegaly, no splenomegaly EXTREMITIES:  Able to move X 4 extremities PSYCHIATRIC: Alert and oriented X 3. Affect and  behavior are appropriate  LABS/RADIOLOGY: Labs reviewed: Basic Metabolic Panel:  Recent Labs  16/10/96 03/23/16 04/03/16  NA 139 139 139  K 3.7 3.5 3.8  BUN 35* 32* 29*  CREATININE 1.3* 1.4* 1.3*   Liver Function Tests:  Recent Labs  10/12/15 02/22/16 02/24/16  AST 25 18 16   ALT 23 11 11   ALKPHOS 212* 138* 145*   CBC:  Recent Labs  02/24/16 02/28/16 04/09/16  WBC 7.9 7.5 5.9  NEUTROABS 5 5 3   HGB 8.6* 8.4* 9.6*  HCT 28* 28* 32*  PLT 200 282 161      ASSESSMENT/PLAN:  CAD - stable; continue ASA 81 mg EC 1 tab daily and NTG PRN  Neuropathy - recently started on Neurontin 100 mg 1 capsule PO Q HS  Osteoarthritis S/P total bilateral knee replacement - continueRNP; continue Norco 5/325 mg 1-2 tabs by mouth every 4 hours when necessary,  for pain; Zanaflex 2 mg 1 tab by mouth every 6 hours when necessary for muscle spasm; Coumadin for DVT prophylaxis; BLE WBAT;  follow-up with orthopedics, Dr. Denyce Robert Pill  Hx of pulmonary embolism - no SOB; continue Coumadin   Long-term use of anticoagulant - INR 3.1, supratherapeutic; start Coumadin 4 mg by mouth Q TTH and 5 mg by mouth every M-W-F-Sat_Sun; check INR on 06/15/16  Allergic rhinitis - continue Claritin 10 mg 1 tab PO daily PRN  Chronic diastolic CHF - continue Demadex 20 mg to 1 tab by mouth daily ; weigh Q Mondays and Wednesdays;  continue 1800 ml fluid restriction; check CMP  Hypertension - well controlled; continue Nadolol 40 mg by mouth daily  Hyperlipidemia - continue Lipitor 40 mg by mouth daily at bedtime Lab Results  Component Value Date   CHOL 70 02/22/2016   HDL 29* 02/22/2016   LDLCALC 27 02/22/2016   TRIG 72 02/22/2016    Anxiety - mood is stable; continue Klonopin to 1 mg by mouth Q HS and 0.5 mg Q D PRN   Depression - mood is stable; continue Zoloft  200 mg by mouth daily and recently discontinued Pamelor ; follows-up with [facility] for psychiatric care  Chronic diarrhea -hx of gastric bypass  surgery; continue Imodium 2 mg by mouth 4 times a day when necessary  Hypokalemia -  continue KCl 20 MEQ ER by mouth twice a day Lab Results  Component Value Date   K 3.8 04/03/2016    Anemia, acute blood loss  - continue ferrous sulfate 325 mg by mouth BID; check CBC Lab Results  Component Value Date   HGB 9.6* 04/09/2016    Overactive Bladder - continue Oxybutynin 5 mg 1 tab PO TID PRN  GERD - continue Pepcid 20 mg 1 tab PO Q D  Hyperthyroidism - continue Methimazole to 10 mg BID Lab Results  Component Value Date   TSH 3.37 04/10/2016   RLS - stable; continue Mirapex 1.5 mg BID  Psychosis - continue Risperidone 0.25 mg  Take 1/2 tab = 0.125 mg PO Q HS   Flatulence - continue Devrom 200 mg daily  Migraine headache - continue Topamax 25 mg 1 tab daily  Vaginal atrophy - Premarin 0.625 mg/gm apply 0.5 gm intravaginally 2X/week  Right shoulder OA - continue Voltare 1% gel 2  topically BID PRN  Right knee wound infection - recently started on Doxycycline 100 mg PO BIDS X 14 days      Goals of care:  Long-term care    Kenard Gower, NP [facility] [phone]

## 2016-06-13 LAB — HEPATIC FUNCTION PANEL
ALK PHOS: 187 U/L — AB (ref 25–125)
ALT: 10 U/L (ref 7–35)
AST: 15 U/L (ref 13–35)
BILIRUBIN, TOTAL: 0.4 mg/dL

## 2016-06-13 LAB — BASIC METABOLIC PANEL
BUN: 25 mg/dL — AB (ref 4–21)
Creatinine: 1.1 mg/dL (ref 0.5–1.1)
Glucose: 114 mg/dL
Potassium: 4 mmol/L (ref 3.4–5.3)
Sodium: 142 mmol/L (ref 137–147)

## 2016-06-13 LAB — CBC AND DIFFERENTIAL
HEMATOCRIT: 33 % — AB (ref 36–46)
Hemoglobin: 9.9 g/dL — AB (ref 12.0–16.0)
PLATELETS: 125 10*3/uL — AB (ref 150–399)
WBC: 5 10^3/mL

## 2016-06-15 LAB — CBC AND DIFFERENTIAL
HCT: 36 % (ref 36–46)
HEMOGLOBIN: 10.6 g/dL — AB (ref 12.0–16.0)
Platelets: 136 10*3/uL — AB (ref 150–399)
WBC: 5 10^3/mL

## 2016-06-20 ENCOUNTER — Ambulatory Visit (HOSPITAL_COMMUNITY): Payer: Medicare Other

## 2016-06-26 ENCOUNTER — Ambulatory Visit (HOSPITAL_COMMUNITY)
Admission: RE | Admit: 2016-06-26 | Discharge: 2016-06-26 | Disposition: A | Discharge status: Home / Self Care | Payer: Medicare Other | Source: Ambulatory Visit | Attending: Internal Medicine | Admitting: Internal Medicine

## 2016-06-26 DIAGNOSIS — S46019A Strain of muscle(s) and tendon(s) of the rotator cuff of unspecified shoulder, initial encounter: Secondary | ICD-10-CM | POA: Diagnosis not present

## 2016-06-26 DIAGNOSIS — M25511 Pain in right shoulder: Secondary | ICD-10-CM

## 2016-06-26 DIAGNOSIS — X58XXXA Exposure to other specified factors, initial encounter: Secondary | ICD-10-CM | POA: Diagnosis not present

## 2016-06-26 DIAGNOSIS — M19011 Primary osteoarthritis, right shoulder: Secondary | ICD-10-CM | POA: Insufficient documentation

## 2016-06-29 ENCOUNTER — Encounter: Payer: Self-pay | Admitting: Adult Health

## 2016-06-29 ENCOUNTER — Non-Acute Institutional Stay (SKILLED_NURSING_FACILITY): Payer: Medicare Other | Admitting: Adult Health

## 2016-06-29 DIAGNOSIS — I2782 Chronic pulmonary embolism: Secondary | ICD-10-CM

## 2016-06-29 DIAGNOSIS — Z7901 Long term (current) use of anticoagulants: Secondary | ICD-10-CM | POA: Diagnosis not present

## 2016-06-29 NOTE — Progress Notes (Signed)
Patient ID: Katie Evans, female   DOB: 11-04-1946, 70 y.o.   MRN: [id number] Subjective:     Indication: PE Bleeding signs/symptoms: None Thromboembolic signs/symptoms: None  Missed Coumadin doses: X 1 due to supratherapeutic INR Medication changes: no Dietary changes: no Bacterial/viral infection: no Other concerns: no  The following portions of the patient's history were reviewed and updated as appropriate: allergies, current medications, past family history, past medical history, past social history, past surgical history and problem list.  Review of Systems A comprehensive review of systems was negative.   Objective:    INR Today: 2.7 Current dose: Coumadin 5 mg Q M_W_F_Sat-Sun and Coumadin 4 mg Q Tues and Thurs    Assessment:    Therapeutic INR for goal of 2-3   Plan:    1. New dose: Coumadin 5 mg PO Q M-W-F-Sun and Coumadin 4 mg PO Q T-Th-Sat   2. Next INR: 07/03/16

## 2016-07-11 ENCOUNTER — Non-Acute Institutional Stay (SKILLED_NURSING_FACILITY): Payer: Medicare Other | Admitting: Internal Medicine

## 2016-07-11 ENCOUNTER — Encounter: Payer: Self-pay | Admitting: Internal Medicine

## 2016-07-11 DIAGNOSIS — F329 Major depressive disorder, single episode, unspecified: Secondary | ICD-10-CM | POA: Diagnosis not present

## 2016-07-11 DIAGNOSIS — E0590 Thyrotoxicosis, unspecified without thyrotoxic crisis or storm: Secondary | ICD-10-CM

## 2016-07-11 DIAGNOSIS — Z7901 Long term (current) use of anticoagulants: Secondary | ICD-10-CM | POA: Diagnosis not present

## 2016-07-11 DIAGNOSIS — D638 Anemia in other chronic diseases classified elsewhere: Secondary | ICD-10-CM | POA: Diagnosis not present

## 2016-07-11 DIAGNOSIS — E785 Hyperlipidemia, unspecified: Secondary | ICD-10-CM

## 2016-07-11 DIAGNOSIS — E669 Obesity, unspecified: Secondary | ICD-10-CM | POA: Diagnosis not present

## 2016-07-11 DIAGNOSIS — I2782 Chronic pulmonary embolism: Secondary | ICD-10-CM | POA: Diagnosis not present

## 2016-07-11 DIAGNOSIS — I10 Essential (primary) hypertension: Secondary | ICD-10-CM

## 2016-07-11 NOTE — Progress Notes (Signed)
LOCATION: [facility]  PCP: Oneal Grout, MD   Code Status: DNR  Goals of care: Advanced Directive information No flowsheet data found.    Extended Emergency Contact Information Primary Emergency Contact: Grast,Lauran Address: [address]           [place], [place] [address] [place] Home Phone: [phone] Work Phone: [phone] Mobile Phone: [phone] Relation: Sister Secondary Emergency Contact: Pearson [place] Mobile Phone: [phone] Relation: Sister   Allergies  Allergen Reactions  . Remeron [Mirtazapine]     Chief Complaint  Patient presents with  . Medical Management of Chronic Issues    Routine Visit     HPI:  Patient is a 70 y.o. female seen today for routine visit. She complaints of her knee hurting but her pain medication has been helping her. She continues to work with therapy team. She is followed by PMR and psychiatry services. She is out of bed daily. No fall reported. She feeds herself and wheels herself around on a wheelchair. No skin concern from treatment nurse.    Review of Systems:  Constitutional: Negative for fever, chills HENT: Negative for headache, congestion, nasal discharge Eyes: Negative for blurred vision Respiratory: Negative for cough, wheezing.  she has dyspnea on exertion. Cardiovascular: Negative for chest pain, palpitations. Positive for leg swelling.  Gastrointestinal: Negative for heartburn, nausea, vomiting, abdominal pain. Had bowel movement yesterday.  Genitourinary: Negative for dysuria and flank pain.  Musculoskeletal: Negative for back pain, fall.  Skin: Negative for itching, rash.  Neurological: negative for dizziness. Psychiatric/Behavioral: negative for depression.    Past Medical History:  Diagnosis Date  . Allergic rhinitis   . Anemia   . Arthritis   . Bruising   . Carpal tunnel syndrome   . CHF (congestive heart failure) (HCC)   . Chronic diarrhea     . Chronic pulmonary embolism (HCC)   . Demand ischemia of myocardium (HCC)   . Flatulence   . Generalized anxiety disorder   . GERD without esophagitis   . Hyperlipidemia   . Hypertension   . Hyperthyroidism   . Hypokalemia   . Long term current use of anticoagulant therapy   . Major depressive disorder (HCC)   . Metabolic encephalopathy   . Overactive bladder   . Protein calorie malnutrition (HCC)   . Psychosis   . Renal disorder   . RLS (restless legs syndrome)   . Thrombocytopathia (HCC)   . Thrombocytopenia (HCC)   . Vaginal atrophy    Past Surgical History:  Procedure Laterality Date  . ABDOMINAL HYSTERECTOMY    . APPENDECTOMY    . BACK SURGERY    . GASTRIC BYPASS    . SHOULDER SURGERY    . SPINE SURGERY     spinal fusion  . TONSILLECTOMY      Medications:   Medication List       Accurate as of 07/11/16  2:08 PM. Always use your most recent med list.          ARTIFICIAL TEARS OP Apply to eye 3 (three) times daily as needed. Both eyes   aspirin 81 MG chewable tablet Chew 81 mg by mouth daily.   clonazePAM 1 MG tablet Commonly known as:  KLONOPIN Take 1 mg by mouth at bedtime.   clonazePAM 0.5 MG tablet Commonly known as:  KLONOPIN Take 0.5 mg by mouth daily as needed.   conjugated estrogens vaginal cream Commonly known as:  PREMARIN Place 1  Applicatorful vaginally 2 (two) times a week. On Wednesdays and Fridays Q 12 AM per patient request   COUMADIN PO Take 4.5 mg by mouth daily.   D3-1000 PO Take 1 tablet by mouth daily.   DECUBI-VITE Caps Take 1 capsule by mouth daily.   DEVROM 200 MG Chew Generic drug:  Bismuth Subgallate Chew 1 tablet by mouth daily.   diclofenac sodium 1 % Gel Commonly known as:  VOLTAREN Apply 2 g topically 2 (two) times daily as needed. Apply to bilateral shoulders and knees BID PRN   famotidine 20 MG tablet Commonly known as:  PEPCID Take 20 mg by mouth daily.   FEROSUL PO Take 325 mg by mouth 2 (two)  times daily.   gabapentin 100 MG capsule Commonly known as:  NEURONTIN Take 100 mg by mouth at bedtime.   HYDROcodone-acetaminophen 5-325 MG tablet Commonly known as:  NORCO/VICODIN Take 1-2 tablets by mouth every 4 (four) hours as needed for moderate pain.   LIPITOR 40 MG tablet Generic drug:  atorvastatin Take 40 mg by mouth at bedtime.   loperamide 2 MG capsule Commonly known as:  IMODIUM Take 2 mg by mouth 4 (four) times daily as needed for diarrhea or loose stools.   loratadine 10 MG tablet Commonly known as:  CLARITIN Take 10 mg by mouth daily as needed.   methimazole 10 MG tablet Commonly known as:  TAPAZOLE Take 10 mg by mouth 2 (two) times daily.   nadolol 40 MG tablet Commonly known as:  CORGARD Take 40 mg by mouth daily.   nitroGLYCERIN 0.4 MG SL tablet Commonly known as:  NITROSTAT Place 0.4 mg under the tongue every 5 (five) minutes as needed for chest pain. For 3 doses as needed for chest pain   oxybutynin 5 MG tablet Commonly known as:  DITROPAN Take 5 mg by mouth 3 (three) times daily as needed for bladder spasms.   polycarbophil 625 MG tablet Commonly known as:  FIBERCON Take 625 mg by mouth daily as needed (chronic diarrhea).   potassium chloride SA 20 MEQ tablet Commonly known as:  K-DUR,KLOR-CON Take 20 mEq by mouth 2 (two) times daily. Take with food.  Do not crush.  May dissolve.   pramipexole 1.5 MG tablet Commonly known as:  MIRAPEX Take 1.5 mg by mouth 2 (two) times daily.   SECURA EXTRA PROTECTIVE EX Apply topically as needed. Apply to sacrum and buttocks   sertraline 100 MG tablet Commonly known as:  ZOLOFT Take 200 mg by mouth daily. Take two 100 mg tablets to = 200 mg po QD for depression   shark liver oil-cocoa butter 0.25-3-85.5 % suppository Commonly known as:  PREPARATION H Place 1 suppository rectally 2 (two) times daily as needed for hemorrhoids.   tiZANidine 2 MG tablet Commonly known as:  ZANAFLEX Take 2 mg by mouth  every 6 (six) hours as needed for muscle spasms.   topiramate 25 MG tablet Commonly known as:  TOPAMAX Take 25 mg by mouth daily.   torsemide 20 MG tablet Commonly known as:  DEMADEX Take 20 mg by mouth daily.       Immunizations: There is no immunization history for the selected administration types on file for this patient.   Physical Exam: Vitals:   07/11/16 1353  BP: (!) 109/58  Pulse: 72  Resp: 20  Temp: (!) 96.7 F (35.9 C)  TempSrc: Oral  SpO2: 99%  Weight: 209 lb (94.8 kg)  Height: 5\' 2"  (1.575 m)  Body mass index is 38.23 kg/m.  General- elderly female, obese, in no acute distress Head- normocephalic, atraumatic Nose- no nasal discharge Throat- moist mucus membrane  Eyes- PERRLA, EOMI, no pallor, no icterus Neck- no cervical lymphadenopathy Cardiovascular- normal s1,s2, no murmur, 1+ leg edema present Respiratory- bilateral clear to auscultation, no wheeze, no rhonchi, no crackles, no use of accessory muscles Abdomen- bowel sounds present, soft, non tender Musculoskeletal- able to move all 4 extremities, limited ROM with both knees  Neurological- alert and oriented to person, place and time but poor attention span  Skin- warm and dry Psychiatry- normal mood and affect    Labs reviewed: Basic Metabolic Panel:  Recent Labs  08/65/7804/28/17 04/03/16 06/13/16  NA 139 139 142  K 3.5 3.8 4.0  BUN 32* 29* 25*  CREATININE 1.4* 1.3* 1.1   Liver Function Tests:  Recent Labs  02/22/16 02/24/16 06/13/16  AST 18 16 15   ALT 11 11 10   ALKPHOS 138* 145* 187*    CBC:  Recent Labs  02/24/16 02/28/16 04/09/16 06/13/16 06/15/16  WBC 7.9 7.5 5.9 5.0 5.0  NEUTROABS 5 5 3   --   --   HGB 8.6* 8.4* 9.6* 9.9* 10.6*  HCT 28* 28* 32* 33* 36  PLT 200 282 161 125* 136*      Assessment/Plan  Hypertension Stable bp reading on review, continue nadolol and torsemide, monitor BMP periodically  Hyperlipidemia Lipid Panel     Component Value Date/Time   CHOL 70  02/22/2016   TRIG 72 02/22/2016   HDL 29 (A) 02/22/2016   LDLCALC 27 02/22/2016   LDL at goal. Decrease lipitor to 20 mg qhs and monitor  Chronic pulmonary embolism Stable breathing overall. Continue warfarin 4.5 mg daily  Long term anticoagulation inr 2.2 on 07/09/16. Continue coumadin 4.5 mg daily and check inr 07/13/16  Anemia of chronic disease Reviewed H&H. Continue feso4 325 mg bid and monitor  Obesity Needs dietary control with calorie counting. Patient advised to avoid junk food and encouraged to be OOB. Check a1c with weight gain. RD consult. Lab Results  Component Value Date   HGBA1C 5.6 03/14/2016   Wt Readings from Last 3 Encounters:  07/11/16 209 lb (94.8 kg)  06/29/16 204 lb (92.5 kg)  06/12/16 204 lb 12.8 oz (92.9 kg)    Hyperthyroidism Lab Results  Component Value Date   TSH 3.37 04/10/2016   Continue her methimazole and monitor  Chronic depression  Stable mood. Continue zoloft and topamax and monitor. Will get psych services to follow     Oneal GroutMAHIMA Jovani Flury, MD Internal Medicine [facility] [address] [place], [place] [address] Cell Phone (Monday-Friday 8 am - 5 pm): [phone] On Call: [phone] and follow prompts after 5 pm and on weekends Office Phone: [phone] Office Fax: [phone]

## 2016-07-12 LAB — HEMOGLOBIN A1C: Hemoglobin A1C: 5.7

## 2016-07-20 ENCOUNTER — Other Ambulatory Visit: Payer: Self-pay | Admitting: *Deleted

## 2016-07-20 MED ORDER — CLONAZEPAM 0.5 MG PO TABS: ORAL_TABLET | ORAL | 0 refills | Status: DC

## 2016-07-20 NOTE — Telephone Encounter (Signed)
[facility] #[phone] Fax: [phone] 

## 2016-07-24 LAB — POCT INR: INR: 2 — AB (ref 0.9–1.1)

## 2016-08-02 ENCOUNTER — Non-Acute Institutional Stay (SKILLED_NURSING_FACILITY): Payer: Medicare Other | Admitting: Adult Health

## 2016-08-02 ENCOUNTER — Encounter: Payer: Self-pay | Admitting: Adult Health

## 2016-08-02 DIAGNOSIS — E876 Hypokalemia: Secondary | ICD-10-CM

## 2016-08-02 DIAGNOSIS — I10 Essential (primary) hypertension: Secondary | ICD-10-CM

## 2016-08-02 DIAGNOSIS — I5032 Chronic diastolic (congestive) heart failure: Secondary | ICD-10-CM | POA: Diagnosis not present

## 2016-08-02 DIAGNOSIS — M19011 Primary osteoarthritis, right shoulder: Secondary | ICD-10-CM

## 2016-08-02 DIAGNOSIS — J309 Allergic rhinitis, unspecified: Secondary | ICD-10-CM

## 2016-08-02 DIAGNOSIS — N3281 Overactive bladder: Secondary | ICD-10-CM

## 2016-08-02 DIAGNOSIS — F419 Anxiety disorder, unspecified: Secondary | ICD-10-CM

## 2016-08-02 DIAGNOSIS — E785 Hyperlipidemia, unspecified: Secondary | ICD-10-CM

## 2016-08-02 DIAGNOSIS — N952 Postmenopausal atrophic vaginitis: Secondary | ICD-10-CM

## 2016-08-02 DIAGNOSIS — F329 Major depressive disorder, single episode, unspecified: Secondary | ICD-10-CM

## 2016-08-02 DIAGNOSIS — I209 Angina pectoris, unspecified: Secondary | ICD-10-CM

## 2016-08-02 DIAGNOSIS — K219 Gastro-esophageal reflux disease without esophagitis: Secondary | ICD-10-CM

## 2016-08-02 DIAGNOSIS — M170 Bilateral primary osteoarthritis of knee: Secondary | ICD-10-CM | POA: Diagnosis not present

## 2016-08-02 DIAGNOSIS — I25119 Atherosclerotic heart disease of native coronary artery with unspecified angina pectoris: Secondary | ICD-10-CM

## 2016-08-02 DIAGNOSIS — I2782 Chronic pulmonary embolism: Secondary | ICD-10-CM

## 2016-08-02 DIAGNOSIS — E0590 Thyrotoxicosis, unspecified without thyrotoxic crisis or storm: Secondary | ICD-10-CM

## 2016-08-02 DIAGNOSIS — D638 Anemia in other chronic diseases classified elsewhere: Secondary | ICD-10-CM | POA: Diagnosis not present

## 2016-08-02 DIAGNOSIS — N390 Urinary tract infection, site not specified: Secondary | ICD-10-CM | POA: Diagnosis not present

## 2016-08-02 DIAGNOSIS — F29 Unspecified psychosis not due to a substance or known physiological condition: Secondary | ICD-10-CM

## 2016-08-02 DIAGNOSIS — G43909 Migraine, unspecified, not intractable, without status migrainosus: Secondary | ICD-10-CM | POA: Diagnosis not present

## 2016-08-02 DIAGNOSIS — R143 Flatulence: Secondary | ICD-10-CM

## 2016-08-02 DIAGNOSIS — G629 Polyneuropathy, unspecified: Secondary | ICD-10-CM

## 2016-08-02 DIAGNOSIS — K529 Noninfective gastroenteritis and colitis, unspecified: Secondary | ICD-10-CM

## 2016-08-02 DIAGNOSIS — G2581 Restless legs syndrome: Secondary | ICD-10-CM

## 2016-08-02 NOTE — Progress Notes (Signed)
Patient ID: Katie Evans, female   DOB: [birth year], 70 y.o.   MRN: [id number]    DATE:    08/02/16  MRN:  [id number]  BIRTHDAY: [birth year]  Facility:  Nursing Home Location:  [facility]  Nursing Home Room Number: 1005-B  LEVEL OF CARE:  SNF [phone])  Contact Information    Name Relation Home Work [place] Sister [phone]   Fuller Plan   [phone]       Code Status History    This patient does not have a recorded code status. Please follow your organizational policy for patients in this situation.      Chief Complaint  Patient presents with  . Medical management of chronic illnesses    HISTORY OF PRESENT ILLNESS:  This is a 70 year old female who is being seen for a routine visit. She is a long-term resident @ [address] She was having dysuria so urinalysis with CS was done. Urine culture showed > 100,000 CFU/ml Klebsiella pneumoniae. No fever nor hematuria has been reported.    PAST MEDICAL HISTORY:  Past Medical History:  Diagnosis Date  . Allergic rhinitis   . Anemia   . Arthritis   . Bruising   . Carpal tunnel syndrome   . CHF (congestive heart failure) (HCC)   . Chronic diarrhea   . Chronic pulmonary embolism (HCC)   . Demand ischemia of myocardium (HCC)   . Flatulence   . Generalized anxiety disorder   . GERD without esophagitis   . Hyperlipidemia   . Hypertension   . Hyperthyroidism   . Hypokalemia   . Long term current use of anticoagulant therapy   . Major depressive disorder (HCC)   . Metabolic encephalopathy   . Overactive bladder   . Protein calorie malnutrition (HCC)   . Psychosis   . Renal disorder   . RLS (restless legs syndrome)   . Thrombocytopathia (HCC)   . Thrombocytopenia (HCC)   . Vaginal atrophy      CURRENT MEDICATIONS: Reviewed    Medication List       Accurate as of 08/02/16 11:59 PM. Always use your most recent med list.          ARTIFICIAL  TEARS OP Apply to eye 3 (three) times daily as needed. Both eyes   aspirin 81 MG chewable tablet Chew 81 mg by mouth daily.   atorvastatin 20 MG tablet Commonly known as:  LIPITOR Take 20 mg by mouth daily.   clonazePAM 1 MG tablet Commonly known as:  KLONOPIN Take 1 mg by mouth at bedtime.   clonazePAM 0.5 MG tablet Commonly known as:  KLONOPIN Take one tablet by mouth once daily   conjugated estrogens vaginal cream Commonly known as:  PREMARIN Place 1 Applicatorful vaginally 2 (two) times a week. On Wednesdays and Fridays Q 12 AM per patient request   COUMADIN PO Take 4 mg by mouth daily.   D3-1000 PO Take 1 tablet by mouth daily.   DECUBI-VITE Caps Take 1 capsule by mouth daily.   DEVROM 200 MG Chew Generic drug:  Bismuth Subgallate Chew 1 tablet by mouth daily.   diclofenac sodium 1 % Gel Commonly known as:  VOLTAREN Apply 2 g topically 2 (two) times daily as needed. Apply to bilateral shoulders and knees BID PRN   famotidine 20 MG tablet Commonly known as:  PEPCID Take 20 mg by mouth daily.   FEROSUL PO Take 325 mg by mouth 2 (two)  times daily.   gabapentin 100 MG capsule Commonly known as:  NEURONTIN Take 100 mg by mouth at bedtime.   HYDROcodone-acetaminophen 5-325 MG tablet Commonly known as:  NORCO/VICODIN Take 1-2 tablets by mouth every 4 (four) hours as needed for moderate pain.   loperamide 2 MG capsule Commonly known as:  IMODIUM Take 2 mg by mouth 4 (four) times daily as needed for diarrhea or loose stools.   loratadine 10 MG tablet Commonly known as:  CLARITIN Take 10 mg by mouth daily as needed.   methimazole 10 MG tablet Commonly known as:  TAPAZOLE Take 10 mg by mouth 2 (two) times daily.   nadolol 40 MG tablet Commonly known as:  CORGARD Take 40 mg by mouth daily.   nitroGLYCERIN 0.4 MG SL tablet Commonly known as:  NITROSTAT Place 0.4 mg under the tongue every 5 (five) minutes as needed for chest pain. For 3 doses as needed  for chest pain   oxybutynin 5 MG tablet Commonly known as:  DITROPAN Take 5 mg by mouth 3 (three) times daily as needed for bladder spasms.   polycarbophil 625 MG tablet Commonly known as:  FIBERCON Take 625 mg by mouth daily as needed (chronic diarrhea).   potassium chloride SA 20 MEQ tablet Commonly known as:  K-DUR,KLOR-CON Take 20 mEq by mouth 2 (two) times daily. Take with food.  Do not crush.  May dissolve.   pramipexole 1.5 MG tablet Commonly known as:  MIRAPEX Take 1.5 mg by mouth 2 (two) times daily.   saccharomyces boulardii 250 MG capsule Commonly known as:  FLORASTOR Take 250 mg by mouth 2 (two) times daily.   SECURA EXTRA PROTECTIVE EX Apply topically as needed. Apply to sacrum and buttocks   sertraline 100 MG tablet Commonly known as:  ZOLOFT Take 200 mg by mouth daily. Take two 100 mg tablets to = 200 mg po QD for depression   shark liver oil-cocoa butter 0.25-3-85.5 % suppository Commonly known as:  PREPARATION H Place 1 suppository rectally 2 (two) times daily as needed for hemorrhoids.   sulfamethoxazole-trimethoprim 800-160 MG tablet Commonly known as:  BACTRIM DS,SEPTRA DS Take 1 tablet by mouth 2 (two) times daily.   tiZANidine 2 MG tablet Commonly known as:  ZANAFLEX Take 2 mg by mouth every 6 (six) hours as needed for muscle spasms.   topiramate 25 MG tablet Commonly known as:  TOPAMAX Take 25 mg by mouth daily.   torsemide 20 MG tablet Commonly known as:  DEMADEX Take 20 mg by mouth daily.         Allergies  Allergen Reactions  . Remeron [Mirtazapine]      REVIEW OF SYSTEMS:  GENERAL: no change in appetite, no fatigue, no weight changes, no fever, chills or weakness EYES: Denies change in vision, dry eyes, eye pain, itching or discharge EARS: Denies change in hearing, ringing in ears, or earache NOSE: Denies nasal congestion or epistaxis MOUTH and THROAT: Denies oral discomfort, gingival pain or bleeding, pain from teeth or  hoarseness   RESPIRATORY: no cough, SOB, DOE, wheezing, hemoptysis CARDIAC: no chest pain, or palpitations GI: no abdominal pain, diarrhea, constipation, heart burn, nausea or vomiting GU: Denies dysuria, frequency, hematuria, incontinence, or discharge PSYCHIATRIC: Denies feeling of depression or anxiety. No report of hallucinations, insomnia, paranoia, or agitation   PHYSICAL EXAMINATION  GENERAL APPEARANCE: Well nourished. In no acute distress. Obese SKIN:  Right knee surgical incision is healed HEAD: Normal in size and contour. No evidence of  trauma EYES: Lids open and close normally. No blepharitis, entropion or ectropion. PERRL. Conjunctivae are clear and sclerae are white. Lenses are without opacity EARS: Pinnae are normal. Patient hears normal voice tunes of the examiner MOUTH and THROAT: Lips are without lesions. Oral mucosa is moist and without lesions. Tongue is normal in shape, size, and color and without lesions NECK: supple, trachea midline, no neck masses, no thyroid tenderness, no thyromegaly LYMPHATICS: no LAN in the neck, no supraclavicular LAN RESPIRATORY: breathing is even & unlabored, BS CTAB CARDIAC: RRR, no murmur,no extra heart sounds, BLE edema 2+ GI: abdomen soft, normal BS, no masses, no tenderness, no hepatomegaly, no splenomegaly EXTREMITIES:  Able to move X 4 extremities PSYCHIATRIC: Alert and oriented X 3. Affect and behavior are appropriate  LABS/RADIOLOGY: Labs reviewed: Basic Metabolic Panel:  Recent Labs  40/98/11 04/03/16 06/13/16  NA 139 139 142  K 3.5 3.8 4.0  BUN 32* 29* 25*  CREATININE 1.4* 1.3* 1.1   Liver Function Tests:  Recent Labs  02/22/16 02/24/16 06/13/16  AST 18 16 15   ALT 11 11 10   ALKPHOS 138* 145* 187*   CBC:  Recent Labs  02/24/16 02/28/16 04/09/16 06/13/16 06/15/16  WBC 7.9 7.5 5.9 5.0 5.0  NEUTROABS 5 5 3   --   --   HGB 8.6* 8.4* 9.6* 9.9* 10.6*  HCT 28* 28* 32* 33* 36  PLT 200 282 161 125* 136*       ASSESSMENT/PLAN:  UTI - start Bactrim DS 1 tab PO BID X 7 days and Florastor 250 mg 1 capsule PO BID X 10 days  CAD - stable; continue ASA 81 mg EC 1 tab daily and NTG PRN  Neuropathy - recently started on Neurontin 100 mg 1 capsule PO Q HS  Osteoarthritis S/P total bilateral knee replacement - continueRNP; continue Norco 5/325 mg 1-2 tabs by mouth every 4 hours when necessary,  for pain; Zanaflex 2 mg 1 tab by mouth every 6 hours when necessary for muscle spasm; Coumadin for DVT prophylaxis; BLE WBAT;  follow-up with orthopedics, Dr. Denyce Robert Pill  Hx of pulmonary embolism - no SOB; continue Coumadin   Allergic rhinitis - continue Claritin 10 mg 1 tab PO daily PRN  Chronic diastolic CHF - continue Demadex 20 mg to 1 tab by mouth daily ; weigh Q Mondays and Wednesdays; continue 1800 ml fluid restriction  Hypertension - well controlled; continue Nadolol 40 mg by mouth daily  Hyperlipidemia - continue Lipitor 20 mg by mouth daily at bedtime Lab Results  Component Value Date   CHOL 70 02/22/2016   HDL 29 (A) 02/22/2016   LDLCALC 27 02/22/2016   TRIG 72 02/22/2016    Anxiety - mood is stable; continue Klonopin to 1 mg by mouth Q HS and 0.5 mg Q D PRN   Major Depression - mood is stable; continue Zoloft  200 mg by mouth daily ; follows-up with [facility] for psychiatric care  Chronic diarrhea -hx of gastric bypass surgery; continue Imodium 2 mg by mouth 4 times a day when necessary  Hypokalemia -  continue KCl 20 MEQ ER by mouth twice a day Lab Results  Component Value Date   K 4.0 06/13/2016    Anemia of chronic disease  - continue ferrous sulfate 325 mg by mouth BID Lab Results  Component Value Date   HGB 10.6 (A) 06/15/2016    Overactive Bladder - continue Oxybutynin 5 mg 1 tab PO TID PRN  GERD - continue Pepcid  20 mg 1 tab PO Q D  Hyperthyroidism - continue Methimazole to 10 mg BID Lab Results  Component Value Date   TSH 3.37 04/10/2016   RLS -  stable; continue Mirapex 1.5 mg BID  Psychosis - continue Risperidone 0.25 mg  Take 1/2 tab = 0.125 mg PO Q HS   Flatulence - continue Devrom 200 mg daily  Migraine headache - continue Topamax 25 mg 1 tab daily  Vaginal atrophy - Premarin 0.625 mg/gm apply 0.5 gm intravaginally 2X/week  Right shoulder OA - continue Voltare 1% gel 2  topically BID PRN     Goals of care:  Long-term care    Kenard Gower, NP [facility] [phone]

## 2016-08-14 ENCOUNTER — Non-Acute Institutional Stay (SKILLED_NURSING_FACILITY): Payer: Medicare Other | Admitting: Adult Health

## 2016-08-14 ENCOUNTER — Encounter: Payer: Self-pay | Admitting: Adult Health

## 2016-08-14 DIAGNOSIS — Z7901 Long term (current) use of anticoagulants: Secondary | ICD-10-CM | POA: Diagnosis not present

## 2016-08-14 DIAGNOSIS — I2782 Chronic pulmonary embolism: Secondary | ICD-10-CM

## 2016-08-14 NOTE — Progress Notes (Signed)
Patient ID: Katie Evans, female   DOB: Nov 10, 1946, 70 y.o.   MRN: [id number] Subjective:     Indication: PE Bleeding signs/symptoms: None Thromboembolic signs/symptoms: None  Missed Coumadin doses: This week - 1 due to supratherapeutic INR Medication changes: yes - recently finished Bactrim treatment for UTI Dietary changes: no Bacterial/viral infection: yes - UTI Other concerns: no  The following portions of the patient's history were reviewed and updated as appropriate: allergies, current medications, past family history, past medical history, past social history, past surgical history and problem list.  Review of Systems A comprehensive review of systems was negative.   Objective:    INR Today: 1.6 Current dose: Coumadin 3 mg daily    Assessment:    Subtherapeutic INR for goal of 2-3   Plan:    1. New dose: Increase Coumadin to 4 mg daily   2. Next INR: 08/17/16

## 2016-08-28 ENCOUNTER — Other Ambulatory Visit: Payer: Self-pay | Admitting: *Deleted

## 2016-08-28 MED ORDER — HYDROCODONE-ACETAMINOPHEN 5-325 MG PO TABS: ORAL_TABLET | ORAL | 0 refills | Status: DC

## 2016-08-28 NOTE — Telephone Encounter (Signed)
[facility] #[phone] Fax: [phone] 

## 2016-08-30 ENCOUNTER — Encounter: Payer: Self-pay | Admitting: Adult Health

## 2016-08-30 ENCOUNTER — Non-Acute Institutional Stay (SKILLED_NURSING_FACILITY): Payer: Medicare Other | Admitting: Adult Health

## 2016-08-30 DIAGNOSIS — M170 Bilateral primary osteoarthritis of knee: Secondary | ICD-10-CM

## 2016-08-30 DIAGNOSIS — F329 Major depressive disorder, single episode, unspecified: Secondary | ICD-10-CM

## 2016-08-30 DIAGNOSIS — F419 Anxiety disorder, unspecified: Secondary | ICD-10-CM

## 2016-08-30 DIAGNOSIS — R143 Flatulence: Secondary | ICD-10-CM

## 2016-08-30 DIAGNOSIS — I5032 Chronic diastolic (congestive) heart failure: Secondary | ICD-10-CM

## 2016-08-30 DIAGNOSIS — E0590 Thyrotoxicosis, unspecified without thyrotoxic crisis or storm: Secondary | ICD-10-CM | POA: Diagnosis not present

## 2016-08-30 DIAGNOSIS — K219 Gastro-esophageal reflux disease without esophagitis: Secondary | ICD-10-CM

## 2016-08-30 DIAGNOSIS — I10 Essential (primary) hypertension: Secondary | ICD-10-CM

## 2016-08-30 DIAGNOSIS — G43909 Migraine, unspecified, not intractable, without status migrainosus: Secondary | ICD-10-CM | POA: Diagnosis not present

## 2016-08-30 DIAGNOSIS — I25119 Atherosclerotic heart disease of native coronary artery with unspecified angina pectoris: Secondary | ICD-10-CM

## 2016-08-30 DIAGNOSIS — K529 Noninfective gastroenteritis and colitis, unspecified: Secondary | ICD-10-CM

## 2016-08-30 DIAGNOSIS — J309 Allergic rhinitis, unspecified: Secondary | ICD-10-CM

## 2016-08-30 DIAGNOSIS — D638 Anemia in other chronic diseases classified elsewhere: Secondary | ICD-10-CM

## 2016-08-30 DIAGNOSIS — I209 Angina pectoris, unspecified: Secondary | ICD-10-CM

## 2016-08-30 DIAGNOSIS — M19011 Primary osteoarthritis, right shoulder: Secondary | ICD-10-CM

## 2016-08-30 DIAGNOSIS — I2782 Chronic pulmonary embolism: Secondary | ICD-10-CM | POA: Diagnosis not present

## 2016-08-30 DIAGNOSIS — E785 Hyperlipidemia, unspecified: Secondary | ICD-10-CM | POA: Diagnosis not present

## 2016-08-30 DIAGNOSIS — E876 Hypokalemia: Secondary | ICD-10-CM | POA: Diagnosis not present

## 2016-08-30 DIAGNOSIS — F29 Unspecified psychosis not due to a substance or known physiological condition: Secondary | ICD-10-CM

## 2016-08-30 DIAGNOSIS — N952 Postmenopausal atrophic vaginitis: Secondary | ICD-10-CM

## 2016-08-30 DIAGNOSIS — N3281 Overactive bladder: Secondary | ICD-10-CM

## 2016-08-30 DIAGNOSIS — G629 Polyneuropathy, unspecified: Secondary | ICD-10-CM

## 2016-08-30 DIAGNOSIS — G2581 Restless legs syndrome: Secondary | ICD-10-CM

## 2016-08-30 NOTE — Progress Notes (Signed)
Patient ID: Katie Evans, female   DOB: [birth year], 70 y.o.   MRN: [id number]    DATE:    08/30/16  MRN:  [id number]  BIRTHDAY: 03-10-1946  Facility:  Nursing Home Location:  [facility]  Nursing Home Room Number: 1005-B  LEVEL OF CARE:  SNF [phone])  Contact Information    Name Relation Home Work [place] Sister [phone]   Fuller Plan   [phone]       Code Status History    This patient does not have a recorded code status. Please follow your organizational policy for patients in this situation.      Chief Complaint  Patient presents with  . Medical management of chronic illnesses    HISTORY OF PRESENT ILLNESS:  This is a 70 year old female who is being seen for a routine visit. She is a long-term resident @ [address] No SOB has been noted. She continues to take Demadex for chronic diastolic chf.   PAST MEDICAL HISTORY:  Past Medical History:  Diagnosis Date  . Allergic rhinitis   . Anemia   . Arthritis   . Bruising   . Carpal tunnel syndrome   . CHF (congestive heart failure) (HCC)   . Chronic diarrhea   . Chronic pulmonary embolism (HCC)   . Demand ischemia of myocardium (HCC)   . Flatulence   . Generalized anxiety disorder   . GERD without esophagitis   . Hyperlipidemia   . Hypertension   . Hyperthyroidism   . Hypokalemia   . Long term current use of anticoagulant therapy   . Major depressive disorder   . Metabolic encephalopathy   . Overactive bladder   . Protein calorie malnutrition (HCC)   . Psychosis   . Renal disorder   . RLS (restless legs syndrome)   . Thrombocytopathia (HCC)   . Thrombocytopenia (HCC)   . Vaginal atrophy      CURRENT MEDICATIONS: Reviewed    Medication List       Accurate as of 08/30/16 11:36 PM. Always use your most recent med list.          ARTIFICIAL TEARS OP Apply to eye 3 (three) times daily as needed. Both eyes   aspirin 81 MG  chewable tablet Chew 81 mg by mouth daily.   atorvastatin 20 MG tablet Commonly known as:  LIPITOR Take 20 mg by mouth daily.   clonazePAM 1 MG tablet Commonly known as:  KLONOPIN Take 1 mg by mouth at bedtime.   clonazePAM 0.5 MG tablet Commonly known as:  KLONOPIN Take one tablet by mouth once daily   conjugated estrogens vaginal cream Commonly known as:  PREMARIN Place 1 Applicatorful vaginally 2 (two) times a week. On Wednesdays and Fridays Q 12 AM per patient request   COUMADIN PO Take 4 mg by mouth daily.   D3-1000 PO Take 1 tablet by mouth daily.   DECUBI-VITE Caps Take 1 capsule by mouth daily.   DEVROM 200 MG Chew Generic drug:  Bismuth Subgallate Chew 1 tablet by mouth daily.   diclofenac sodium 1 % Gel Commonly known as:  VOLTAREN Apply 2 g topically 2 (two) times daily as needed. Apply to bilateral shoulders and knees BID PRN   famotidine 20 MG tablet Commonly known as:  PEPCID Take 20 mg by mouth daily.   FEROSUL PO Take 325 mg by mouth 2 (two) times daily.   gabapentin 100 MG capsule Commonly known as:  NEURONTIN  Take 100 mg by mouth at bedtime.   HYDROcodone-acetaminophen 5-325 MG tablet Commonly known as:  NORCO/VICODIN Take one to two tablets by mouth every 4 hours as needed for pain. Do not exceed 4gm of Tylenol in 24 hours   loperamide 2 MG capsule Commonly known as:  IMODIUM Take 2 mg by mouth 4 (four) times daily as needed for diarrhea or loose stools.   loratadine 10 MG tablet Commonly known as:  CLARITIN Take 10 mg by mouth daily as needed.   methimazole 10 MG tablet Commonly known as:  TAPAZOLE Take 10 mg by mouth 2 (two) times daily.   nadolol 40 MG tablet Commonly known as:  CORGARD Take 40 mg by mouth daily.   nitroGLYCERIN 0.4 MG SL tablet Commonly known as:  NITROSTAT Place 0.4 mg under the tongue every 5 (five) minutes as needed for chest pain. For 3 doses as needed for chest pain   oxybutynin 5 MG tablet Commonly  known as:  DITROPAN Take 5 mg by mouth 3 (three) times daily as needed for bladder spasms.   polycarbophil 625 MG tablet Commonly known as:  FIBERCON Take 625 mg by mouth daily as needed (chronic diarrhea).   potassium chloride SA 20 MEQ tablet Commonly known as:  K-DUR,KLOR-CON Take 20 mEq by mouth 2 (two) times daily. Take with food.  Do not crush.  May dissolve.   pramipexole 1.5 MG tablet Commonly known as:  MIRAPEX Take 1.5 mg by mouth 2 (two) times daily.   SECURA EXTRA PROTECTIVE EX Apply topically as needed. Apply to sacrum and buttocks   sertraline 100 MG tablet Commonly known as:  ZOLOFT Take 200 mg by mouth daily. Take two 100 mg tablets to = 200 mg po QD for depression   shark liver oil-cocoa butter 0.25-3-85.5 % suppository Commonly known as:  PREPARATION H Place 1 suppository rectally 2 (two) times daily as needed for hemorrhoids.   tiZANidine 2 MG tablet Commonly known as:  ZANAFLEX Take 2 mg by mouth every 6 (six) hours as needed for muscle spasms.   topiramate 25 MG tablet Commonly known as:  TOPAMAX Take 25 mg by mouth daily.   torsemide 20 MG tablet Commonly known as:  DEMADEX Take 20 mg by mouth daily.         Allergies  Allergen Reactions  . Remeron [Mirtazapine]      REVIEW OF SYSTEMS:  GENERAL: no change in appetite, no fatigue, no weight changes, no fever, chills or weakness EYES: Denies change in vision, dry eyes, eye pain, itching or discharge EARS: Denies change in hearing, ringing in ears, or earache NOSE: Denies nasal congestion or epistaxis MOUTH and THROAT: Denies oral discomfort, gingival pain or bleeding, pain from teeth or hoarseness   RESPIRATORY: no cough, SOB, DOE, wheezing, hemoptysis CARDIAC: no chest pain, or palpitations GI: no abdominal pain, diarrhea, constipation, heart burn, nausea or vomiting GU: Denies dysuria, frequency, hematuria, incontinence, or discharge PSYCHIATRIC: Denies feeling of depression or  anxiety. No report of hallucinations, insomnia, paranoia, or agitation   PHYSICAL EXAMINATION  GENERAL APPEARANCE: Well nourished. In no acute distress. Obese SKIN:  Right knee surgical incision is healed HEAD: Normal in size and contour. No evidence of trauma EYES: Lids open and close normally. No blepharitis, entropion or ectropion. PERRL. Conjunctivae are clear and sclerae are white. Lenses are without opacity EARS: Pinnae are normal. Patient hears normal voice tunes of the examiner MOUTH and THROAT: Lips are without lesions. Oral mucosa is moist  and without lesions. Tongue is normal in shape, size, and color and without lesions NECK: supple, trachea midline, no neck masses, no thyroid tenderness, no thyromegaly LYMPHATICS: no LAN in the neck, no supraclavicular LAN RESPIRATORY: breathing is even & unlabored, BS CTAB CARDIAC: RRR, no murmur,no extra heart sounds, BLE edema 2+ GI: abdomen soft, normal BS, no masses, no tenderness, no hepatomegaly, no splenomegaly EXTREMITIES:  Able to move X 4 extremities PSYCHIATRIC: Alert and oriented X 3. Affect and behavior are appropriate  LABS/RADIOLOGY: Labs reviewed: Basic Metabolic Panel:  Recent Labs  16/08/9603/28/17 04/03/16 06/13/16  NA 139 139 142  K 3.5 3.8 4.0  BUN 32* 29* 25*  CREATININE 1.4* 1.3* 1.1   Liver Function Tests:  Recent Labs  02/22/16 02/24/16 06/13/16  AST 18 16 15   ALT 11 11 10   ALKPHOS 138* 145* 187*   CBC:  Recent Labs  02/24/16 02/28/16 04/09/16 06/13/16 06/15/16  WBC 7.9 7.5 5.9 5.0 5.0  NEUTROABS 5 5 3   --   --   HGB 8.6* 8.4* 9.6* 9.9* 10.6*  HCT 28* 28* 32* 33* 36  PLT 200 282 161 125* 136*      ASSESSMENT/PLAN:  CAD - stable; continue ASA 81 mg EC 1 tab daily and NTG PRN  Neuropathy - recently started on Neurontin 100 mg 1 capsule PO Q HS  Osteoarthritis S/P total bilateral knee replacement - continueRNP; continue Norco 5/325 mg 1-2 tabs by mouth every 4 hours when necessary,  for pain;  Zanaflex 2 mg 1 tab by mouth every 6 hours when necessary for muscle spasm; Coumadin for DVT prophylaxis; BLE WBAT;  follow-up with orthopedics, Dr. Denyce RobertStephen Geoffrey Pill  Hx of pulmonary embolism - no SOB; continue Coumadin 4 mg PO daily , for INR check on 09/04/16  Allergic rhinitis - continue Claritin 10 mg 1 tab PO daily PRN  Chronic diastolic CHF - continue Demadex 20 mg to 1 tab by mouth daily ; weigh Q Mondays and Wednesdays; continue 1800 ml fluid restriction  Hypertension - well controlled; continue Nadolol 40 mg by mouth daily  Hyperlipidemia - continue Lipitor 20 mg by mouth daily at bedtime Lab Results  Component Value Date   CHOL 70 02/22/2016   HDL 29 (A) 02/22/2016   LDLCALC 27 02/22/2016   TRIG 72 02/22/2016    Anxiety - mood is stable; continue Klonopin to 1 mg by mouth Q HS and 0.5 mg Q D PRN   Major Depression - mood is stable; continue Zoloft  200 mg by mouth daily ; follows-up with [facility] for psychiatric care  Chronic diarrhea -hx of gastric bypass surgery; continue Imodium 2 mg by mouth 4 times a day when necessary  Hypokalemia -  continue KCl 20 MEQ ER by mouth twice a day Lab Results  Component Value Date   K 4.0 06/13/2016    Anemia of chronic disease  - continue ferrous sulfate 325 mg by mouth BID Lab Results  Component Value Date   HGB 10.6 (A) 06/15/2016    Overactive Bladder - continue Oxybutynin 5 mg 1 tab PO TID PRN  GERD - continue Pepcid 20 mg 1 tab PO Q D  Hyperthyroidism - continue Methimazole to 10 mg BID Lab Results  Component Value Date   TSH 3.37 04/10/2016   RLS - stable; continue Mirapex 1.5 mg BID  Psychosis - continue Risperidone 0.25 mg  Take 1/2 tab = 0.125 mg PO Q HS   Flatulence - continue Devrom 200 mg  daily  Migraine headache - continue Topamax 25 mg 1 tab daily  Vaginal atrophy - Premarin 0.625 mg/gm apply 0.5 gm intravaginally 2X/week  Right shoulder OA - continue Voltaren 1% gel 2  topically BID  PRN     Goals of care:  Long-term care    Kenard Gower, NP [facility] [phone]

## 2016-09-03 LAB — BASIC METABOLIC PANEL
BUN: 32 mg/dL — AB (ref 4–21)
Creatinine: 1.5 mg/dL — AB (ref 0.5–1.1)
GLUCOSE: 89 mg/dL
Potassium: 4.1 mmol/L (ref 3.4–5.3)
SODIUM: 141 mmol/L (ref 137–147)

## 2016-09-03 LAB — CBC AND DIFFERENTIAL
HCT: 34 % — AB (ref 36–46)
Hemoglobin: 10.4 g/dL — AB (ref 12.0–16.0)
NEUTROS ABS: 3 /uL
PLATELETS: 134 10*3/uL — AB (ref 150–399)
WBC: 6 10*3/mL

## 2016-09-03 LAB — HEPATIC FUNCTION PANEL
ALT: 19 U/L (ref 7–35)
AST: 26 U/L (ref 13–35)
Alkaline Phosphatase: 180 U/L — AB (ref 25–125)
BILIRUBIN, TOTAL: 0.2 mg/dL

## 2016-09-19 ENCOUNTER — Encounter: Payer: Self-pay | Admitting: Adult Health

## 2016-09-19 ENCOUNTER — Non-Acute Institutional Stay (SKILLED_NURSING_FACILITY): Payer: Medicare Other | Admitting: Adult Health

## 2016-09-19 DIAGNOSIS — Z7901 Long term (current) use of anticoagulants: Secondary | ICD-10-CM

## 2016-09-19 DIAGNOSIS — I2782 Chronic pulmonary embolism: Secondary | ICD-10-CM | POA: Diagnosis not present

## 2016-09-19 NOTE — Progress Notes (Signed)
Patient ID: Katie Evans, female   DOB: 09/06/1946, 70 y.o.   MRN: [id number] Subjective:     Indication: PE Bleeding signs/symptoms: None Thromboembolic signs/symptoms: None  Missed Coumadin doses: This week - 1 due to supratherapeutic INR Medication changes: no Dietary changes: no Bacterial/viral infection: no Other concerns: no  The following portions of the patient's history were reviewed and updated as appropriate: allergies, current medications, past family history, past medical history, past social history, past surgical history and problem list.  Review of Systems A comprehensive review of systems was negative.   Objective:    INR Today: 2.6 Current dose: Coumadin 4 mg Q D    Assessment:    Therapeutic INR for goal of 2-3   Plan:    1. New dose: Decrease Coumadin to 3 mg PO Q D   2. Next INR: 09/25/16

## 2016-09-28 ENCOUNTER — Encounter: Payer: Self-pay | Admitting: Adult Health

## 2016-09-28 ENCOUNTER — Non-Acute Institutional Stay (SKILLED_NURSING_FACILITY): Payer: Medicare Other | Admitting: Adult Health

## 2016-09-28 DIAGNOSIS — K219 Gastro-esophageal reflux disease without esophagitis: Secondary | ICD-10-CM | POA: Diagnosis not present

## 2016-09-28 DIAGNOSIS — E876 Hypokalemia: Secondary | ICD-10-CM | POA: Diagnosis not present

## 2016-09-28 DIAGNOSIS — Z7901 Long term (current) use of anticoagulants: Secondary | ICD-10-CM | POA: Diagnosis not present

## 2016-09-28 DIAGNOSIS — I10 Essential (primary) hypertension: Secondary | ICD-10-CM

## 2016-09-28 DIAGNOSIS — E0590 Thyrotoxicosis, unspecified without thyrotoxic crisis or storm: Secondary | ICD-10-CM

## 2016-09-28 DIAGNOSIS — I2782 Chronic pulmonary embolism: Secondary | ICD-10-CM

## 2016-09-28 DIAGNOSIS — I25119 Atherosclerotic heart disease of native coronary artery with unspecified angina pectoris: Secondary | ICD-10-CM | POA: Diagnosis not present

## 2016-09-28 DIAGNOSIS — E785 Hyperlipidemia, unspecified: Secondary | ICD-10-CM | POA: Diagnosis not present

## 2016-09-28 DIAGNOSIS — G629 Polyneuropathy, unspecified: Secondary | ICD-10-CM

## 2016-09-28 DIAGNOSIS — I5032 Chronic diastolic (congestive) heart failure: Secondary | ICD-10-CM

## 2016-09-28 DIAGNOSIS — N3281 Overactive bladder: Secondary | ICD-10-CM | POA: Diagnosis not present

## 2016-09-28 DIAGNOSIS — F329 Major depressive disorder, single episode, unspecified: Secondary | ICD-10-CM

## 2016-09-28 DIAGNOSIS — I209 Angina pectoris, unspecified: Secondary | ICD-10-CM

## 2016-09-28 NOTE — Progress Notes (Addendum)
Patient ID: Katie Evans, female   DOB: 1946/09/11, 70 y.o.   MRN: [id number]    DATE:    09/28/16  MRN:  [id number]  BIRTHDAY: 1946/09/11  Facility:  Nursing Home [facility]  Nursing Home Room Number: 1005-B  LEVEL OF CARE:  SNF [phone](31)  Contact Information    Name Relation Home Work [place]   Grast,Lauran Sister [phone]   Fuller Planaylor,Dana Sister   [phone]       Code Status History    This patient does not have a recorded code status. Please follow your organizational policy for patients in this situation.      Chief Complaint  Patient presents with  . Medical management of chronic illnesses    HISTORY OF PRESENT ILLNESS:  This is a 70 year old female who is being seen for a routine visit. She is a long-term resident @ [address] [facility].  Latest INR 2.2, therapeutic. She takes Coumadin for Hx of PE.    PAST MEDICAL HISTORY:  Past Medical History:  Diagnosis Date  . Allergic rhinitis   . Anemia   . Anxiety   . Arthritis   . Bruising   . Carpal tunnel syndrome   . CHF (congestive heart failure) (HCC)   . Chronic diarrhea   . Chronic pulmonary embolism (HCC)   . Demand ischemia of myocardium (HCC)   . Flatulence   . Generalized anxiety disorder   . GERD without esophagitis   . Hemorrhoids   . Hyperlipidemia   . Hypertension   . Hyperthyroidism   . Hypokalemia   . Long term current use of anticoagulant therapy   . Major depressive disorder   . Metabolic encephalopathy   . Overactive bladder   . Protein calorie malnutrition (HCC)   . Psychosis   . Renal disorder   . RLS (restless legs syndrome)   . Thrombocytopathia (HCC)   . Thrombocytopenia (HCC)   . Vaginal atrophy      CURRENT MEDICATIONS: Reviewed  Allergies as of 09/28/2016      Reactions   Remeron [mirtazapine]       Medication List       Accurate as of 09/28/16 11:59 PM. Always use your most recent med list.          ARTIFICIAL  TEARS OP Apply to eye 3 (three) times daily as needed. Both eyes   aspirin 81 MG chewable tablet Chew 81 mg by mouth daily.   atorvastatin 20 MG tablet Commonly known as:  LIPITOR Take 20 mg by mouth daily.   clonazePAM 1 MG tablet Commonly known as:  KLONOPIN Take 1 mg by mouth at bedtime.   clonazePAM 0.5 MG tablet Commonly known as:  KLONOPIN Take one tablet by mouth once daily   conjugated estrogens vaginal cream Commonly known as:  PREMARIN Place 1 Applicatorful vaginally 2 (two) times a week. On Wednesdays and Fridays Q 12 AM per patient request   D3-1000 PO Take 1 tablet by mouth daily.   DECUBI-VITE Caps Take 1 capsule by mouth daily.   DEVROM 200 MG Chew Generic drug:  Bismuth Subgallate Chew 1 tablet by mouth daily.   diclofenac sodium 1 % Gel Commonly known as:  VOLTAREN Apply 2 g topically 2 (two) times daily as needed. Apply to bilateral shoulders and knees BID PRN   famotidine 20 MG tablet Commonly known as:  PEPCID Take 20 mg by mouth daily.   FEROSUL PO Take 325 mg by mouth  2 (two) times daily.   gabapentin 100 MG capsule Commonly known as:  NEURONTIN Take 100 mg by mouth at bedtime.   HYDROcodone-acetaminophen 5-325 MG tablet Commonly known as:  NORCO/VICODIN Take one to two tablets by mouth every 4 hours as needed for pain. Do not exceed 4gm of Tylenol in 24 hours   lidocaine 5 % Commonly known as:  LIDODERM Place 1 patch onto the skin daily. Remove & Discard patch within 12 hours or as directed by MD. Apply to posterior neck.   loperamide 2 MG capsule Commonly known as:  IMODIUM Take 2 mg by mouth 4 (four) times daily as needed for diarrhea or loose stools.   loratadine 10 MG tablet Commonly known as:  CLARITIN Take 10 mg by mouth daily as needed.   methimazole 10 MG tablet Commonly known as:  TAPAZOLE Take 10 mg by mouth 2 (two) times daily.   nadolol 40 MG tablet Commonly known as:  CORGARD Take 40 mg by mouth daily.    nitroGLYCERIN 0.4 MG SL tablet Commonly known as:  NITROSTAT Place 0.4 mg under the tongue every 5 (five) minutes as needed for chest pain. For 3 doses as needed for chest pain   oxybutynin 5 MG tablet Commonly known as:  DITROPAN Take 5 mg by mouth daily.   polycarbophil 625 MG tablet Commonly known as:  FIBERCON Take 625 mg by mouth daily as needed (chronic diarrhea).   potassium chloride SA 20 MEQ tablet Commonly known as:  K-DUR,KLOR-CON Take 20 mEq by mouth 2 (two) times daily. Take with food.  Do not crush.  May dissolve.   pramipexole 1.5 MG tablet Commonly known as:  MIRAPEX Take 1.5 mg by mouth 2 (two) times daily.   SECURA EXTRA PROTECTIVE EX Apply topically as needed. Apply to sacrum and buttocks   sertraline 100 MG tablet Commonly known as:  ZOLOFT Take 200 mg by mouth daily. Take two 100 mg tablets to = 200 mg po QD for depression   shark liver oil-cocoa butter 0.25-3-85.5 % suppository Commonly known as:  PREPARATION H Place 1 suppository rectally 2 (two) times daily as needed for hemorrhoids.   tiZANidine 2 MG tablet Commonly known as:  ZANAFLEX Take 2 mg by mouth. Take 2 mg po q8h x1 week, then 2 mg po q12h x1 week, then 2 mg po qd prn x1 week, and then discontinue   topiramate 25 MG tablet Commonly known as:  TOPAMAX Take 25 mg by mouth daily.   torsemide 20 MG tablet Commonly known as:  DEMADEX Take 20 mg by mouth daily.         Allergies  Allergen Reactions  . Remeron [Mirtazapine]      REVIEW OF SYSTEMS:  GENERAL: no change in appetite, no fatigue, no weight changes, no fever, chills or weakness EYES: Denies change in vision, dry eyes, eye pain, itching or discharge EARS: Denies change in hearing, ringing in ears, or earache NOSE: Denies nasal congestion or epistaxis MOUTH and THROAT: Denies oral discomfort, gingival pain or bleeding, pain from teeth or hoarseness   RESPIRATORY: no cough, SOB, DOE, wheezing, hemoptysis CARDIAC: no  chest pain, or palpitations GI: no abdominal pain, diarrhea, constipation, heart burn, nausea or vomiting GU: Denies dysuria, frequency, hematuria, incontinence, or discharge PSYCHIATRIC: Denies feeling of depression or anxiety. No report of hallucinations, insomnia, paranoia, or agitation   PHYSICAL EXAMINATION  GENERAL APPEARANCE: Well nourished. In no acute distress. Obese SKIN:  Skin is warm and dry. HEAD:  Normal in size and contour. No evidence of trauma EYES: Lids open and close normally. No blepharitis, entropion or ectropion. PERRL. Conjunctivae are clear and sclerae are white. Lenses are without opacity EARS: Pinnae are normal. Patient hears normal voice tunes of the examiner MOUTH and THROAT: Lips are without lesions. Oral mucosa is moist and without lesions. Tongue is normal in shape, size, and color and without lesions NECK: supple, trachea midline, no neck masses, no thyroid tenderness, no thyromegaly LYMPHATICS: no LAN in the neck, no supraclavicular LAN RESPIRATORY: breathing is even & unlabored, BS CTAB CARDIAC: RRR, no murmur,no extra heart sounds, BLE edema 2+ GI: abdomen soft, normal BS, no masses, no tenderness, no hepatomegaly, no splenomegaly EXTREMITIES:  Able to move X 4 extremities PSYCHIATRIC: Alert and oriented X 3. Affect and behavior are appropriate  LABS/RADIOLOGY: Labs reviewed: Basic Metabolic Panel:  Recent Labs  16/08/9604/09/17 06/13/16 09/03/16  NA 139 142 141  K 3.8 4.0 4.1  BUN 29* 25* 32*  CREATININE 1.3* 1.1 1.5*   Liver Function Tests:  Recent Labs  02/24/16 06/13/16 09/03/16  AST 16 15 26   ALT 11 10 19   ALKPHOS 145* 187* 180*   CBC:  Recent Labs  02/28/16 04/09/16 06/13/16 06/15/16 09/03/16  WBC 7.5 5.9 5.0 5.0 6.0  NEUTROABS 5 3  --   --  3  HGB 8.4* 9.6* 9.9* 10.6* 10.4*  HCT 28* 32* 33* 36 34*  PLT 282 161 125* 136* 134*      ASSESSMENT/PLAN:  Chronic diastolic CHF - continue Demadex 20 mg to 1 tab by mouth daily ; weigh Q  Mondays and Wednesdays; continue 1800 ml fluid restriction  Hypertension - well controlled; continue Nadolol 40 mg by mouth daily  CAD - stable; continue ASA 81 mg EC 1 tab daily and NTG PRN  Neuropathy - continue Neurontin 100 mg 1 capsule PO Q HS  Hx of pulmonary embolism - no SOB; continue Coumadin   Long-term use of anticoagulant - INR 2.2; continue Coumadin 3 mg 1 tab by mouth every Mondays-Wednesdays-Fridays and Coumadin 4 mg 1 tab by mouth every Tuesdays-Thursdays-Saturdays-Sundays; check INR on 10/02/16  Hyperlipidemia - continue Lipitor 20 mg by mouth daily at bedtime Lab Results  Component Value Date   CHOL 70 02/22/2016   HDL 29 (A) 02/22/2016   LDLCALC 27 02/22/2016   TRIG 72 02/22/2016    Major Depression - mood is stable; continue Zoloft  200 mg by mouth daily ; follows-up with [facility] for psychiatric care  Hypokalemia -  continue KCl 20 MEQ ER by mouth twice a day Lab Results  Component Value Date   K 4.1 09/03/2016   Overactive Bladder - continue Oxybutynin 5 mg 1 tab PO TID PRN  GERD - continue Pepcid 20 mg 1 tab PO Q D  Hyperthyroidism - continue Methimazole to 10 mg BID Lab Results  Component Value Date   TSH 3.37 04/10/2016        Goals of care:  Long-term care    Kenard GowerMonina Medina-Vargas, NP [facility] [phone]

## 2016-10-29 ENCOUNTER — Non-Acute Institutional Stay (SKILLED_NURSING_FACILITY): Payer: Medicare Other | Admitting: Adult Health

## 2016-10-29 ENCOUNTER — Encounter: Payer: Self-pay | Admitting: Adult Health

## 2016-10-29 DIAGNOSIS — I209 Angina pectoris, unspecified: Secondary | ICD-10-CM | POA: Diagnosis not present

## 2016-10-29 DIAGNOSIS — I25119 Atherosclerotic heart disease of native coronary artery with unspecified angina pectoris: Secondary | ICD-10-CM | POA: Diagnosis not present

## 2016-10-29 DIAGNOSIS — K219 Gastro-esophageal reflux disease without esophagitis: Secondary | ICD-10-CM

## 2016-10-29 DIAGNOSIS — I2782 Chronic pulmonary embolism: Secondary | ICD-10-CM

## 2016-10-29 DIAGNOSIS — G629 Polyneuropathy, unspecified: Secondary | ICD-10-CM | POA: Diagnosis not present

## 2016-10-29 DIAGNOSIS — F419 Anxiety disorder, unspecified: Secondary | ICD-10-CM | POA: Diagnosis not present

## 2016-10-29 DIAGNOSIS — G43909 Migraine, unspecified, not intractable, without status migrainosus: Secondary | ICD-10-CM

## 2016-10-29 DIAGNOSIS — I10 Essential (primary) hypertension: Secondary | ICD-10-CM

## 2016-10-29 DIAGNOSIS — N3281 Overactive bladder: Secondary | ICD-10-CM | POA: Diagnosis not present

## 2016-10-29 DIAGNOSIS — D638 Anemia in other chronic diseases classified elsewhere: Secondary | ICD-10-CM

## 2016-10-29 NOTE — Progress Notes (Signed)
Patient ID: Katie Roseamela Evans, female   DOB: Dec 11, 1945, 70 y.o.   MRN: [id number]    DATE:    10/29/16  MRN:  [id number]  BIRTHDAY: Dec 11, 1945  Facility:  Nursing Home Location:  [facility]  Nursing Home Room Number: 1005-B  LEVEL OF CARE:  SNF [phone])  Contact Information    Name Relation Home Work [employer]   Grast,Lauran Sister [phone]   Fuller Planaylor,Dana Sister   [phone]       Code Status History    This patient does not have a recorded code status. Please follow your organizational policy for patients in this situation.      Chief Complaint  Patient presents with  . Medical management of chronic illnesses    HISTORY OF PRESENT ILLNESS:  This is a 70 year old female who is being seen for a routine visit. She is a long-term resident @ [address] [facility]. Topamax was recently increased to to 50 mg BID due to migraine headache.     PAST MEDICAL HISTORY:  Past Medical History:  Diagnosis Date  . Allergic rhinitis   . Anemia   . Anxiety   . Arthritis   . Bruising   . Carpal tunnel syndrome   . CHF (congestive heart failure) (HCC)   . Chronic diarrhea   . Chronic pulmonary embolism (HCC)   . Demand ischemia of myocardium (HCC)   . Flatulence   . Generalized anxiety disorder   . GERD without esophagitis   . Hemorrhoids   . Hyperlipidemia   . Hypertension   . Hyperthyroidism   . Hypokalemia   . Long term current use of anticoagulant therapy   . Major depressive disorder   . Metabolic encephalopathy   . Overactive bladder   . Protein calorie malnutrition (HCC)   . Psychosis   . Renal disorder   . RLS (restless legs syndrome)   . Thrombocytopathia (HCC)   . Thrombocytopenia (HCC)   . Vaginal atrophy      CURRENT MEDICATIONS: Reviewed  Allergies as of 10/29/2016      Reactions   Remeron [mirtazapine]       Medication List       Accurate as of 10/29/16 11:59 PM. Always use your most recent med list.           ARTIFICIAL TEARS OP Apply to eye 3 (three) times daily as needed. Both eyes   aspirin 81 MG chewable tablet Chew 81 mg by mouth daily.   atorvastatin 20 MG tablet Commonly known as:  LIPITOR Take 20 mg by mouth daily.   clonazePAM 1 MG tablet Commonly known as:  KLONOPIN Take 1 mg by mouth at bedtime.   clonazePAM 0.5 MG tablet Commonly known as:  KLONOPIN Take 0.5 mg by mouth daily as needed for anxiety.   conjugated estrogens vaginal cream Commonly known as:  PREMARIN Place 1 Applicatorful vaginally 2 (two) times a week. On Wednesdays and Fridays Q 12 AM per patient request   D3-1000 PO Take 1 tablet by mouth daily.   DECUBI-VITE Caps Take 1 capsule by mouth daily.   DEVROM 200 MG Chew Generic drug:  Bismuth Subgallate Chew 1 tablet by mouth daily.   diclofenac sodium 1 % Gel Commonly known as:  VOLTAREN Apply 2 g topically 2 (two) times daily as needed. Apply to bilateral shoulders and knees BID PRN   famotidine 20 MG tablet Commonly known as:  PEPCID Take 20 mg by mouth daily.   FEROSUL PO  Take 325 mg by mouth 2 (two) times daily.   gabapentin 100 MG capsule Commonly known as:  NEURONTIN Take 100 mg by mouth at bedtime.   HYDROcodone-acetaminophen 5-325 MG tablet Commonly known as:  NORCO/VICODIN Take one to two tablets by mouth every 4 hours as needed for pain. Do not exceed 4gm of Tylenol in 24 hours   lidocaine 5 % Commonly known as:  LIDODERM Place 1 patch onto the skin daily. Remove & Discard patch within 12 hours or as directed by MD. Apply to posterior neck.   loperamide 2 MG capsule Commonly known as:  IMODIUM Take 2 mg by mouth 4 (four) times daily as needed for diarrhea or loose stools.   loratadine 10 MG tablet Commonly known as:  CLARITIN Take 10 mg by mouth daily as needed.   methimazole 10 MG tablet Commonly known as:  TAPAZOLE Take 10 mg by mouth 2 (two) times daily.   nadolol 40 MG tablet Commonly known as:  CORGARD Take 40 mg  by mouth daily.   nitroGLYCERIN 0.4 MG SL tablet Commonly known as:  NITROSTAT Place 0.4 mg under the tongue every 5 (five) minutes as needed for chest pain. For 3 doses as needed for chest pain   oxybutynin 5 MG tablet Commonly known as:  DITROPAN Take 5 mg by mouth daily.   polycarbophil 625 MG tablet Commonly known as:  FIBERCON Take 625 mg by mouth daily as needed (chronic diarrhea).   potassium chloride SA 20 MEQ tablet Commonly known as:  K-DUR,KLOR-CON Take 20 mEq by mouth 2 (two) times daily. Take with food.  Do not crush.  May dissolve.   pramipexole 1.5 MG tablet Commonly known as:  MIRAPEX Take 1.5 mg by mouth 2 (two) times daily.   SECURA EXTRA PROTECTIVE EX Apply topically as needed. Apply to sacrum and buttocks   sertraline 100 MG tablet Commonly known as:  ZOLOFT Take 200 mg by mouth daily. Take two 100 mg tablets to = 200 mg po QD for depression   shark liver oil-cocoa butter 0.25-3-85.5 % suppository Commonly known as:  PREPARATION H Place 1 suppository rectally 2 (two) times daily as needed for hemorrhoids.   tiZANidine 2 MG tablet Commonly known as:  ZANAFLEX Take 2 mg by mouth. Take 2 mg po q8h x1 week, then 2 mg po q12h x1 week, then 2 mg po qd prn x1 week, and then discontinue   topiramate 50 MG tablet Commonly known as:  TOPAMAX Take 50 mg by mouth 2 (two) times daily.   torsemide 20 MG tablet Commonly known as:  DEMADEX Take 20 mg by mouth daily.         Allergies  Allergen Reactions  . Remeron [Mirtazapine]      REVIEW OF SYSTEMS:  GENERAL: no change in appetite, no fatigue, no weight changes, no fever, chills or weakness EYES: Denies change in vision, dry eyes, eye pain, itching or discharge EARS: Denies change in hearing, ringing in ears, or earache NOSE: Denies nasal congestion or epistaxis MOUTH and THROAT: Denies oral discomfort, gingival pain or bleeding, pain from teeth or hoarseness   RESPIRATORY: no cough, SOB, DOE,  wheezing, hemoptysis CARDIAC: no chest pain, or palpitations GI: no abdominal pain, diarrhea, constipation, heart burn, nausea or vomiting GU: Denies dysuria, frequency, hematuria, incontinence, or discharge PSYCHIATRIC: Denies feeling of depression or anxiety. No report of hallucinations, insomnia, paranoia, or agitation   PHYSICAL EXAMINATION  GENERAL APPEARANCE: Well nourished. In no acute distress. Obese  SKIN:  Skin is warm and dry. HEAD: Normal in size and contour. No evidence of trauma EYES: Lids open and close normally. No blepharitis, entropion or ectropion. PERRL. Conjunctivae are clear and sclerae are white. Lenses are without opacity EARS: Pinnae are normal. Patient hears normal voice tunes of the examiner MOUTH and THROAT: Lips are without lesions. Oral mucosa is moist and without lesions. Tongue is normal in shape, size, and color and without lesions NECK: supple, trachea midline, no neck masses, no thyroid tenderness, no thyromegaly LYMPHATICS: no LAN in the neck, no supraclavicular LAN RESPIRATORY: breathing is even & unlabored, BS CTAB CARDIAC: RRR, no murmur,no extra heart sounds, BLE edema 2+ GI: abdomen soft, normal BS, no masses, no tenderness, no hepatomegaly, no splenomegaly EXTREMITIES:  Able to move X 4 extremities PSYCHIATRIC: Alert and oriented X 3. Affect and behavior are appropriate  LABS/RADIOLOGY: Labs reviewed: Basic Metabolic Panel:  Recent Labs  16/08/9604/09/17 06/13/16 09/03/16  NA 139 142 141  K 3.8 4.0 4.1  BUN 29* 25* 32*  CREATININE 1.3* 1.1 1.5*   Liver Function Tests:  Recent Labs  02/24/16 06/13/16 09/03/16  AST 16 15 26   ALT 11 10 19   ALKPHOS 145* 187* 180*   CBC:  Recent Labs  02/28/16 04/09/16 06/13/16 06/15/16 09/03/16  WBC 7.5 5.9 5.0 5.0 6.0  NEUTROABS 5 3  --   --  3  HGB 8.4* 9.6* 9.9* 10.6* 10.4*  HCT 28* 32* 33* 36 34*  PLT 282 161 125* 136* 134*      ASSESSMENT/PLAN:  Migraine headache - stable; Topamax was recently  increased to 50 mg by mouth twice a day  Anxiety - mood this is stable; continue Klonopin 1 mg 1 tab by mouth daily at bedtime and 0.5 mg by mouth daily when necessary; followed-up by [facility] NP  Anemia of chronic disease - continue ferrous sulfate 325 mg 1 tab by mouth twice a day Lab Results  Component Value Date   HGB 10.4 (A) 09/03/2016    Hypertension - well controlled; continue Nadolol 40 mg by mouth daily  CAD - stable; continue ASA 81 mg EC 1 tab daily and NTG PRN  Neuropathy - continue Neurontin 100 mg 1 capsule PO Q HS  Hx of pulmonary embolism - no SOB; continue Coumadin   Overactive Bladder - continue Oxybutynin 5 mg 1 tab PO TID PRN  GERD - continue Pepcid 20 mg 1 tab PO Q D      Goals of care:  Long-term care    Kenard GowerMonina Medina-Vargas, NP [facility] [phone] d

## 2016-11-28 ENCOUNTER — Non-Acute Institutional Stay (SKILLED_NURSING_FACILITY): Payer: Medicare Other | Admitting: Adult Health

## 2016-11-28 ENCOUNTER — Encounter: Payer: Self-pay | Admitting: Adult Health

## 2016-11-28 DIAGNOSIS — N183 Chronic kidney disease, stage 3 unspecified: Secondary | ICD-10-CM

## 2016-11-28 DIAGNOSIS — F419 Anxiety disorder, unspecified: Secondary | ICD-10-CM | POA: Diagnosis not present

## 2016-11-28 DIAGNOSIS — D638 Anemia in other chronic diseases classified elsewhere: Secondary | ICD-10-CM | POA: Diagnosis not present

## 2016-11-28 DIAGNOSIS — D631 Anemia in chronic kidney disease: Secondary | ICD-10-CM

## 2016-11-28 DIAGNOSIS — R2681 Unsteadiness on feet: Secondary | ICD-10-CM

## 2016-11-28 DIAGNOSIS — I25119 Atherosclerotic heart disease of native coronary artery with unspecified angina pectoris: Secondary | ICD-10-CM | POA: Diagnosis not present

## 2016-11-28 DIAGNOSIS — G629 Polyneuropathy, unspecified: Secondary | ICD-10-CM

## 2016-11-28 DIAGNOSIS — I10 Essential (primary) hypertension: Secondary | ICD-10-CM

## 2016-11-28 DIAGNOSIS — G43909 Migraine, unspecified, not intractable, without status migrainosus: Secondary | ICD-10-CM

## 2016-11-28 DIAGNOSIS — K219 Gastro-esophageal reflux disease without esophagitis: Secondary | ICD-10-CM | POA: Diagnosis not present

## 2016-11-28 DIAGNOSIS — I2782 Chronic pulmonary embolism: Secondary | ICD-10-CM

## 2016-11-28 DIAGNOSIS — I5032 Chronic diastolic (congestive) heart failure: Secondary | ICD-10-CM | POA: Diagnosis not present

## 2016-11-28 DIAGNOSIS — I209 Angina pectoris, unspecified: Secondary | ICD-10-CM

## 2016-11-28 NOTE — Progress Notes (Signed)
Patient ID: Katie Evans, female   DOB: October 12, 1946, 71 y.o.   MRN: [id number]    DATE:    11/28/16  MRN:  [id number]  BIRTHDAY: 03/29/1946  Facility:  Nursing Home Location:  [facility]  Nursing Home Room Number: 1005-B  LEVEL OF CARE:  SNF [phone])  Contact Information    Name Relation Home Work [place] Sister [phone]   Fuller Plan   [phone]       Code Status History    This patient does not have a recorded code status. Please follow your organizational policy for patients in this situation.      Chief Complaint  Patient presents with  . Medical management of chronic illnesses    HISTORY OF PRESENT ILLNESS:  This is a 71 year old female who is being seen for a routine visit. She is a long-term resident @ [address] Klonopin when necessary was recently discontinued. Zanaflex is currently being tapered off. She is currently having physical therapy for gait training.   PAST MEDICAL HISTORY:  Past Medical History:  Diagnosis Date  . Allergic rhinitis   . Anemia   . Anxiety   . Arthritis   . Bruising   . Carpal tunnel syndrome   . CHF (congestive heart failure) (HCC)   . Chronic diarrhea   . Chronic pulmonary embolism (HCC)   . Demand ischemia of myocardium (HCC)   . Flatulence   . Generalized anxiety disorder   . GERD without esophagitis   . Hemorrhoids   . Hyperlipidemia   . Hypertension   . Hyperthyroidism   . Hypokalemia   . Long term current use of anticoagulant therapy   . Major depressive disorder   . Metabolic encephalopathy   . Overactive bladder   . Protein calorie malnutrition (HCC)   . Psychosis   . Renal disorder   . RLS (restless legs syndrome)   . Thrombocytopathia (HCC)   . Thrombocytopenia (HCC)   . Vaginal atrophy      CURRENT MEDICATIONS: Reviewed  Allergies as of 11/28/2016      Reactions   Remeron [mirtazapine]       Medication List       Accurate  as of 11/28/16  7:27 PM. Always use your most recent med list.          ARTIFICIAL TEARS OP Apply to eye 3 (three) times daily as needed. Both eyes   aspirin 81 MG chewable tablet Chew 81 mg by mouth daily.   atorvastatin 20 MG tablet Commonly known as:  LIPITOR Take 20 mg by mouth daily.   clonazePAM 1 MG tablet Commonly known as:  KLONOPIN Take 1 mg by mouth at bedtime.   conjugated estrogens vaginal cream Commonly known as:  PREMARIN Place 1 Applicatorful vaginally 2 (two) times a week. On Wednesdays and Fridays Q 12 AM per patient request   COUMADIN PO Take by mouth daily. Take 4 mg M-F, take 3 mg Sat and Sun   D3-1000 PO Take 1 tablet by mouth daily.   DECUBI-VITE Caps Take 1 capsule by mouth daily.   DEVROM 200 MG Chew Generic drug:  Bismuth Subgallate Chew 1 tablet by mouth daily.   diclofenac sodium 1 % Gel Commonly known as:  VOLTAREN Apply 2 g topically 2 (two) times daily as needed. Apply to bilateral shoulders and knees BID PRN   famotidine 20 MG tablet Commonly known as:  PEPCID Take 20 mg by mouth daily.  FEROSUL PO Take 325 mg by mouth 2 (two) times daily.   gabapentin 100 MG capsule Commonly known as:  NEURONTIN Take 100 mg by mouth at bedtime.   HYDROcodone-acetaminophen 5-325 MG tablet Commonly known as:  NORCO/VICODIN Take one to two tablets by mouth every 4 hours as needed for pain. Do not exceed 4gm of Tylenol in 24 hours   lidocaine 5 % Commonly known as:  LIDODERM Place 1 patch onto the skin daily. Remove & Discard patch within 12 hours or as directed by MD. Apply to posterior neck.   loperamide 2 MG capsule Commonly known as:  IMODIUM Take 2 mg by mouth 4 (four) times daily as needed for diarrhea or loose stools.   loratadine 10 MG tablet Commonly known as:  CLARITIN Take 10 mg by mouth daily as needed.   methimazole 10 MG tablet Commonly known as:  TAPAZOLE Take 10 mg by mouth 2 (two) times daily.   nadolol 40 MG  tablet Commonly known as:  CORGARD Take 40 mg by mouth daily.   nitroGLYCERIN 0.4 MG SL tablet Commonly known as:  NITROSTAT Place 0.4 mg under the tongue every 5 (five) minutes as needed for chest pain. For 3 doses as needed for chest pain   oxybutynin 5 MG tablet Commonly known as:  DITROPAN Take 5 mg by mouth daily.   polycarbophil 625 MG tablet Commonly known as:  FIBERCON Take 625 mg by mouth daily as needed (chronic diarrhea).   potassium chloride SA 20 MEQ tablet Commonly known as:  K-DUR,KLOR-CON Take 20 mEq by mouth 2 (two) times daily. Take with food.  Do not crush.  May dissolve.   pramipexole 1.5 MG tablet Commonly known as:  MIRAPEX Take 1.5 mg by mouth 2 (two) times daily.   SECURA EXTRA PROTECTIVE EX Apply topically as needed. Apply to sacrum and buttocks   sertraline 100 MG tablet Commonly known as:  ZOLOFT Take 200 mg by mouth daily. Take two 100 mg tablets to = 200 mg po QD for depression   shark liver oil-cocoa butter 0.25-3-85.5 % suppository Commonly known as:  PREPARATION H Place 1 suppository rectally 2 (two) times daily as needed for hemorrhoids.   tiZANidine 2 MG tablet Commonly known as:  ZANAFLEX Take 2 mg by mouth. Take 2 mg po q8h x1 week, then 2 mg po q12h x1 week, then 2 mg po qd prn x1 week, and then discontinue   topiramate 50 MG tablet Commonly known as:  TOPAMAX Take 50 mg by mouth 2 (two) times daily.   torsemide 20 MG tablet Commonly known as:  DEMADEX Take 20 mg by mouth daily.         Allergies  Allergen Reactions  . Remeron [Mirtazapine]      REVIEW OF SYSTEMS:  GENERAL: no change in appetite, no fatigue, no weight changes, no fever, chills or weakness EYES: Denies change in vision, dry eyes, eye pain, itching or discharge EARS: Denies change in hearing, ringing in ears, or earache NOSE: Denies nasal congestion or epistaxis MOUTH and THROAT: Denies oral discomfort, gingival pain or bleeding, pain from teeth or  hoarseness   RESPIRATORY: no cough, SOB, DOE, wheezing, hemoptysis CARDIAC: no chest pain, or palpitations GI: no abdominal pain, diarrhea, constipation, heart burn, nausea or vomiting GU: Denies dysuria, frequency, hematuria, incontinence, or discharge PSYCHIATRIC: Denies feeling of depression or anxiety. No report of hallucinations, insomnia, paranoia, or agitation   PHYSICAL EXAMINATION  GENERAL APPEARANCE: Well nourished. In no acute  distress. Obese SKIN:  Skin is warm and dry. HEAD: Normal in size and contour. No evidence of trauma EYES: Lids open and close normally. No blepharitis, entropion or ectropion. PERRL. Conjunctivae are clear and sclerae are white. Lenses are without opacity EARS: Pinnae are normal. Patient hears normal voice tunes of the examiner MOUTH and THROAT: Lips are without lesions. Oral mucosa is moist and without lesions. Tongue is normal in shape, size, and color and without lesions NECK: supple, trachea midline, no neck masses, no thyroid tenderness, no thyromegaly LYMPHATICS: no LAN in the neck, no supraclavicular LAN RESPIRATORY: breathing is even & unlabored, BS CTAB CARDIAC: RRR, no murmur,no extra heart sounds, BLE edema 2+ GI: abdomen soft, normal BS, no masses, no tenderness, no hepatomegaly, no splenomegaly EXTREMITIES:  Able to move X 4 extremities PSYCHIATRIC: Alert and oriented X 3. Affect and behavior are appropriate  LABS/RADIOLOGY: Labs reviewed: Basic Metabolic Panel:  Recent Labs  16/10/96 06/13/16 09/03/16  NA 139 142 141  K 3.8 4.0 4.1  BUN 29* 25* 32*  CREATININE 1.3* 1.1 1.5*   Liver Function Tests:  Recent Labs  02/24/16 06/13/16 09/03/16  AST 16 15 26   ALT 11 10 19   ALKPHOS 145* 187* 180*   CBC:  Recent Labs  02/28/16 04/09/16 06/13/16 06/15/16 09/03/16  WBC 7.5 5.9 5.0 5.0 6.0  NEUTROABS 5 3  --   --  3  HGB 8.4* 9.6* 9.9* 10.6* 10.4*  HCT 28* 32* 33* 36 34*  PLT 282 161 125* 136* 134*       ASSESSMENT/PLAN:  Unsteady gait - currently having physical therapy for gait training; fall precautions  Chronic kidney disease, stage III -  check BMP 09/03/16  creatinine 1.52, GFR 36.15  Chronic diastolic CHF - no SOB; continue torsemide 20 mg 1 tab by mouth daily; continue 1800 mL fluid restriction; monitor weights  Migraine headache - stable; Topamax was recently increased to 50 mg by mouth twice a day  Anxiety - mood  is stable; continue Klonopin 1 mg 1 tab by mouth daily at bedtime and Klonopin PRN was discontinued; followed-up by [facility] NP  Anemia of chronic disease - discontinue FeSO4 and check CBC in 2 weeks Lab Results  Component Value Date   HGB 10.4 (A) 09/03/2016    Hypertension - well controlled; continue Nadolol 40 mg by mouth daily  CAD - stable; continue ASA 81 mg EC 1 tab daily and NTG PRN  Neuropathy - continue Neurontin 100 mg 1 capsule PO Q HS  Hx of pulmonary embolism - no SOB; continue Coumadin   GERD - continue Pepcid 20 mg 1 tab PO Q D      Goals of care:  Long-term care    Kenard Gower, NP [facility] [phone]

## 2016-12-12 LAB — CBC AND DIFFERENTIAL
HCT: 38 % (ref 36–46)
Hemoglobin: 11.6 g/dL — AB (ref 12.0–16.0)
Neutrophils Absolute: 6 /uL
PLATELETS: 154 10*3/uL (ref 150–399)
WBC: 8.5 10*3/mL

## 2017-01-02 ENCOUNTER — Non-Acute Institutional Stay (SKILLED_NURSING_FACILITY): Payer: Medicare Other | Admitting: Adult Health

## 2017-01-02 ENCOUNTER — Encounter: Payer: Self-pay | Admitting: Adult Health

## 2017-01-02 DIAGNOSIS — K219 Gastro-esophageal reflux disease without esophagitis: Secondary | ICD-10-CM

## 2017-01-02 DIAGNOSIS — N183 Chronic kidney disease, stage 3 unspecified: Secondary | ICD-10-CM

## 2017-01-02 DIAGNOSIS — Z86711 Personal history of pulmonary embolism: Secondary | ICD-10-CM | POA: Diagnosis not present

## 2017-01-02 DIAGNOSIS — F419 Anxiety disorder, unspecified: Secondary | ICD-10-CM | POA: Diagnosis not present

## 2017-01-02 DIAGNOSIS — G629 Polyneuropathy, unspecified: Secondary | ICD-10-CM

## 2017-01-03 ENCOUNTER — Encounter: Payer: Self-pay | Admitting: Adult Health

## 2017-01-03 NOTE — Progress Notes (Signed)
DATE:  01/02/2017  MRN:  [id number]  BIRTHDAY: 1946-09-19  Facility:  Nursing Home Location:  [facility]  Nursing Home Room Number: 1005-B  LEVEL OF CARE:  SNF [phone])  Contact Information    Name Relation Home Work Dickinson Sister [phone]   Fuller Plan   [phone]       Code Status History    This patient does not have a recorded code status. Please follow your organizational policy for patients in this situation.       Chief Complaint  Patient presents with  . Medical Management of Chronic Issues    HISTORY OF PRESENT ILLNESS:  This is a 31-YO female seen for a routine visit.  She is a long-term care resident at [facility]. She was recently discharged from PT.     PAST MEDICAL HISTORY:  Past Medical History:  Diagnosis Date  . Allergic rhinitis   . Anemia   . Anxiety   . Arthritis   . Bruising   . Carpal tunnel syndrome   . CHF (congestive heart failure) (HCC)   . Chronic diarrhea   . Chronic pulmonary embolism (HCC)   . Demand ischemia of myocardium (HCC)   . Flatulence   . Generalized anxiety disorder   . GERD without esophagitis   . Hemorrhoids   . Hyperlipidemia   . Hypertension   . Hyperthyroidism   . Hypokalemia   . Long term current use of anticoagulant therapy   . Major depressive disorder   . Metabolic encephalopathy   . Overactive bladder   . Protein calorie malnutrition (HCC)   . Psychosis   . Renal disorder   . RLS (restless legs syndrome)   . Thrombocytopathia (HCC)   . Thrombocytopenia (HCC)   . Vaginal atrophy      CURRENT MEDICATIONS: Reviewed  Patient's Medications  New Prescriptions   No medications on file  Previous Medications   ASPIRIN 81 MG CHEWABLE TABLET    Chew 81 mg by mouth daily.    ATORVASTATIN (LIPITOR) 20 MG TABLET    Take 20 mg by mouth daily.   BISMUTH SUBGALLATE (DEVROM) 200 MG CHEW    Chew 1 tablet by mouth  daily.    CHOLECALCIFEROL (D3-1000 PO)    Take 1,000 Units by mouth daily.    CLONAZEPAM (KLONOPIN) 1 MG TABLET    Take 1 mg by mouth at bedtime.   CONJUGATED ESTROGENS (PREMARIN) VAGINAL CREAM    Place 1 Applicatorful vaginally 2 (two) times a week. On Wednesdays and Fridays Q 12 AM per patient request   DICLOFENAC SODIUM (VOLTAREN) 1 % GEL    Apply 2 g topically 2 (two) times daily as needed. Apply to bilateral shoulders and knees BID PRN   FAMOTIDINE (PEPCID) 20 MG TABLET    Take 20 mg by mouth daily.    GABAPENTIN (NEURONTIN) 100 MG CAPSULE    Take 100 mg by mouth at bedtime.   HYDROCODONE-ACETAMINOPHEN (NORCO/VICODIN) 5-325 MG TABLET    Take one to two tablets by mouth every 4 hours as needed for pain. Do not exceed 4gm of Tylenol in 24 hours   HYPROMELLOSE (ARTIFICIAL TEARS OP)    Apply to eye 3 (three) times daily as needed. Both eyes   LIDOCAINE (LIDODERM) 5 %    Place 1 patch onto the skin daily. Remove & Discard patch within 12 hours or as directed by MD. Apply to posterior neck.  LOPERAMIDE (IMODIUM) 2 MG CAPSULE    Take 2 mg by mouth 4 (four) times daily as needed for diarrhea or loose stools.    LORATADINE (CLARITIN) 10 MG TABLET    Take 10 mg by mouth daily as needed.    METHIMAZOLE (TAPAZOLE) 10 MG TABLET    Take 10 mg by mouth 2 (two) times daily.   NADOLOL (CORGARD) 40 MG TABLET    Take 40 mg by mouth daily.    NITROGLYCERIN (NITROSTAT) 0.4 MG SL TABLET    Place 0.4 mg under the tongue every 5 (five) minutes as needed for chest pain. For 3 doses as needed for chest pain   OXYBUTYNIN (DITROPAN) 5 MG TABLET    Take 5 mg by mouth daily.    POLYCARBOPHIL (FIBERCON) 625 MG TABLET    Take 625 mg by mouth daily as needed (chronic diarrhea).    POTASSIUM CHLORIDE SA (K-DUR,KLOR-CON) 20 MEQ TABLET    Take 20 mEq by mouth 2 (two) times daily. Take with food.  Do not crush.  May dissolve.   PRAMIPEXOLE (MIRAPEX) 1.5 MG TABLET    Take 1.5 mg by mouth 2 (two) times daily.    SERTRALINE  (ZOLOFT) 100 MG TABLET    Take 200 mg by mouth daily. Take two 100 mg tablets to = 200 mg po QD for depression   SHARK LIVER OIL-COCOA BUTTER (PREPARATION H) 0.25-3-85.5 % SUPPOSITORY    Place 1 suppository rectally 2 (two) times daily as needed for hemorrhoids.   TOPIRAMATE (TOPAMAX) 50 MG TABLET    Take 50 mg by mouth 2 (two) times daily.   TORSEMIDE (DEMADEX) 20 MG TABLET    Take 20 mg by mouth daily.    WARFARIN SODIUM (COUMADIN PO)    Take by mouth daily. Take 4 mg M-F, take 3 mg Sat and Sun   ZINC OXIDE (SECURA EXTRA PROTECTIVE EX)    Apply topically as needed. Apply to sacrum and buttocks  Modified Medications   No medications on file  Discontinued Medications   FERROUS SULFATE (FEROSUL PO)    Take 325 mg by mouth 2 (two) times daily.    MULTIPLE VITAMINS-MINERALS (DECUBI-VITE) CAPS    Take 1 capsule by mouth daily.   TIZANIDINE (ZANAFLEX) 2 MG TABLET    Take 2 mg by mouth. Take 2 mg po q8h x1 week, then 2 mg po q12h x1 week, then 2 mg po qd prn x1 week, and then discontinue     Allergies  Allergen Reactions  . Remeron [Mirtazapine]      REVIEW OF SYSTEMS:  GENERAL: no change in appetite, no fatigue, no weight changes, no fever, chills or weakness EYES: Denies change in vision, dry eyes, eye pain, itching or discharge EARS: Denies change in hearing, ringing in ears, or earache NOSE: Denies nasal congestion or epistaxis MOUTH and THROAT: Denies oral discomfort, gingival pain or bleeding, pain from teeth or hoarseness   RESPIRATORY: no cough, SOB, DOE, wheezing, hemoptysis CARDIAC: no chest pain, or palpitations GI: no abdominal pain, diarrhea, constipation, heart burn, nausea or vomiting GU: Denies dysuria, frequency, hematuria, incontinence, or discharge PSYCHIATRIC: Denies feeling of depression or anxiety. No report of hallucinations, insomnia, paranoia, or agitation    PHYSICAL EXAMINATION  GENERAL APPEARANCE: Well nourished. In no acute distress. Morbidly obese SKIN:   Skin is warm and dry.  HEAD: Normal in size and contour. No evidence of trauma EYES: Lids open and close normally. No blepharitis, entropion or ectropion. PERRL. Conjunctivae  are clear and sclerae are white. Lenses are without opacity EARS: Pinnae are normal. Patient hears normal voice tunes of the examiner MOUTH and THROAT: Lips are without lesions. Oral mucosa is moist and without lesions. Tongue is normal in shape, size, and color and without lesions NECK: supple, trachea midline, no neck masses, no thyroid tenderness, no thyromegaly LYMPHATICS: no LAN in the neck, no supraclavicular LAN RESPIRATORY: breathing is even & unlabored, BS CTAB CARDIAC: RRR, no murmur,no extra heart sounds, BLE 2+ edema GI: abdomen soft, normal BS, no masses, no tenderness, no hepatomegaly, no splenomegaly EXTREMITIES:  Able to move X 4 extremities PSYCHIATRIC: Alert and oriented X 3. Affect and behavior are appropriate  LABS/RADIOLOGY: Labs reviewed: Basic Metabolic Panel:  Recent Labs  96/02/5404/09/17 06/13/16 09/03/16  NA 139 142 141  K 3.8 4.0 4.1  BUN 29* 25* 32*  CREATININE 1.3* 1.1 1.5*   Liver Function Tests:  Recent Labs  02/24/16 06/13/16 09/03/16  AST 16 15 26   ALT 11 10 19   ALKPHOS 145* 187* 180*   CBC:  Recent Labs  04/09/16  06/15/16 09/03/16 12/12/16  WBC 5.9  < > 5.0 6.0 8.5  NEUTROABS 3  --   --  3 6  HGB 9.6*  < > 10.6* 10.4* 11.6*  HCT 32*  < > 36 34* 38  PLT 161  < > 136* 134* 154  < > = values in this interval not displayed. Lipid Panel:  Recent Labs  02/22/16  HDL 29*    ASSESSMENT/PLAN:  Neuropathy - continue Neurontin 100 mg 1 capsule PO Q HS  Hx of pulmonary embolism - no SOB; continue Coumadin   GERD - continue Pepcid 20 mg 1 tab PO Q D  Anxiety - mood  is stable; continue Klonopin 1 mg 1 tab by mouth daily at bedtime and followed-up by [facility] NP  Chronic kidney disease, stage III -  will monitor 11/29/16  creatinine 1.49, GFR 36.97    Goals  of care:  Long-term care    Mikeya Tomasetti C. Medina-Vargas  -  NP [facility] [phone]

## 2017-01-22 LAB — CBC AND DIFFERENTIAL
HCT: 31 % — AB (ref 36–46)
Hemoglobin: 9.9 g/dL — AB (ref 12.0–16.0)
Neutrophils Absolute: 3 /uL
PLATELETS: 107 10*3/uL — AB (ref 150–399)
WBC: 5.4 10*3/mL

## 2017-01-22 LAB — BASIC METABOLIC PANEL
BUN: 31 mg/dL — AB (ref 4–21)
Creatinine: 1.6 mg/dL — AB (ref 0.5–1.1)
GLUCOSE: 98 mg/dL
Potassium: 3.7 mmol/L (ref 3.4–5.3)
Sodium: 142 mmol/L (ref 137–147)

## 2017-01-31 ENCOUNTER — Non-Acute Institutional Stay (SKILLED_NURSING_FACILITY): Payer: Medicare Other | Admitting: Adult Health

## 2017-01-31 ENCOUNTER — Encounter: Payer: Self-pay | Admitting: Adult Health

## 2017-01-31 DIAGNOSIS — I10 Essential (primary) hypertension: Secondary | ICD-10-CM

## 2017-01-31 DIAGNOSIS — N183 Chronic kidney disease, stage 3 (moderate): Secondary | ICD-10-CM

## 2017-01-31 DIAGNOSIS — I5032 Chronic diastolic (congestive) heart failure: Secondary | ICD-10-CM

## 2017-01-31 DIAGNOSIS — D631 Anemia in chronic kidney disease: Secondary | ICD-10-CM

## 2017-01-31 DIAGNOSIS — F39 Unspecified mood [affective] disorder: Secondary | ICD-10-CM

## 2017-01-31 DIAGNOSIS — I25119 Atherosclerotic heart disease of native coronary artery with unspecified angina pectoris: Secondary | ICD-10-CM

## 2017-01-31 DIAGNOSIS — E0590 Thyrotoxicosis, unspecified without thyrotoxic crisis or storm: Secondary | ICD-10-CM | POA: Diagnosis not present

## 2017-01-31 NOTE — Progress Notes (Signed)
DATE:    01/31/17  MRN:  [id number]  BIRTHDAY: [birth year]  Facility:  Nursing Home Location[facility]  [facility]  Nursing Home Room Number: 1005-B  LEVEL OF CARE:  SNF [phone])  Contact Information    Name Relation Home Work [place]   Grast,Lauran Sister [phone]   Fuller Planaylor,Dana Sister   [phone]       Code Status History    This patient does not have a recorded code status. Please follow your organizational policy for patients in this situation.       Chief Complaint  Patient presents with  . Medical Management of Chronic Issues    HISTORY OF PRESENT ILLNESS:  This is a 71-YO female seen for a routine visit.  She is a long[facility] care [facility]. Imaging of the neck was recently done which showed possible Hashimoto thyroiditis or Graves' disease. Patient is currently on Tapazole 10 mg twice a day for hyperthyroidism. Patient noted to be sleepy and stays in bed until lunch.  PAST MEDICAL HISTORY:  Past Medical History:  Diagnosis Date  . Allergic rhinitis   . Anemia   . Anxiety   . Arthritis   . Bruising   . Carpal tunnel syndrome   . CHF (congestive heart failure) (HCC)   . Chronic diarrhea   . Chronic pulmonary embolism (HCC)   . Demand ischemia of myocardium (HCC)   . Flatulence   . Generalized anxiety disorder   . GERD without esophagitis   . Hemorrhoids   . Hyperlipidemia   . Hypertension   . Hyperthyroidism   . Hypokalemia   . Long term current use of anticoagulant therapy   . Major depressive disorder   . Metabolic encephalopathy   . Overactive bladder   . Protein calorie malnutrition (HCC)   . Psychosis   . Renal disorder   . RLS (restless legs syndrome)   . Thrombocytopathia (HCC)   . Thrombocytopenia (HCC)   . Vaginal atrophy      CURRENT MEDICATIONS: Reviewed  Patient's Medications  New Prescriptions   No medications on file  Previous Medications   ASPIRIN 81  MG CHEWABLE TABLET    Chew 81 mg by mouth daily.    ATORVASTATIN (LIPITOR) 20 MG TABLET    Take 20 mg by mouth daily.   BISMUTH SUBGALLATE (DEVROM) 200 MG CHEW    Chew 1 tablet by mouth daily.    CHOLECALCIFEROL (D3-1000 PO)    Take 1,000 Units by mouth daily.    CLONAZEPAM (KLONOPIN) 1 MG TABLET    Take 1 mg by mouth at bedtime.   FAMOTIDINE (PEPCID) 20 MG TABLET    Take 20 mg by mouth daily.    GABAPENTIN (NEURONTIN) 100 MG CAPSULE    Take 100 mg by mouth at bedtime.   HYDROCODONE-ACETAMINOPHEN (NORCO/VICODIN) 5-325 MG TABLET    Take one to two tablets by mouth every 4 hours as needed for pain. Do not exceed 4gm of Tylenol in 24 hours   HYPROMELLOSE (ARTIFICIAL TEARS OP)    Apply to eye 3 (three) times daily as needed. Both eyes   LIDOCAINE (LIDODERM) 5 %    Place 1 patch onto the skin daily. Remove & Discard patch within 12 hours or as directed by MD. Apply to posterior neck.   LOPERAMIDE (IMODIUM) 2 MG CAPSULE    Take 2 mg by mouth 4 (four) times daily as needed for diarrhea or loose stools.  LORATADINE (CLARITIN) 10 MG TABLET    Take 10 mg by mouth daily as needed.    MENTHOL, TOPICAL ANALGESIC, (ICY HOT EX)    Apply 1 application topically. Apply 7.5% cream to both shoulders at 9AM, 3PM, 9PM.  Hold for skin irritation.   METHIMAZOLE (TAPAZOLE) 10 MG TABLET    Take 10 mg by mouth 2 (two) times daily.   NADOLOL (CORGARD) 40 MG TABLET    Take 40 mg by mouth daily.    NITROGLYCERIN (NITROSTAT) 0.4 MG SL TABLET    Place 0.4 mg under the tongue every 5 (five) minutes as needed for chest pain. For 3 doses as needed for chest pain   OXYBUTYNIN (DITROPAN) 5 MG TABLET    Take 5 mg by mouth daily.    POLYCARBOPHIL (FIBERCON) 625 MG TABLET    Take 625 mg by mouth daily as needed (chronic diarrhea).    POTASSIUM CHLORIDE SA (K-DUR,KLOR-CON) 20 MEQ TABLET    Take 20 mEq by mouth 2 (two) times daily. Take with food.  Do not crush.  May dissolve.   PRAMIPEXOLE (MIRAPEX) 1.5 MG TABLET    Take 1.5 mg by  mouth 2 (two) times daily.    SERTRALINE (ZOLOFT) 100 MG TABLET    Take 200 mg by mouth daily. Take two 100 mg tablets to = 200 mg po QD for depression   SHARK LIVER OIL-COCOA BUTTER (PREPARATION H) 0.25-3-85.5 % SUPPOSITORY    Place 1 suppository rectally 2 (two) times daily as needed for hemorrhoids.   TOPIRAMATE (TOPAMAX) 50 MG TABLET    Take 50-100 mg by mouth. Take 1 tablet QAM, 2 tablets to = 100 mg QHS   TORSEMIDE (DEMADEX) 20 MG TABLET    Take 20 mg by mouth daily.    WARFARIN SODIUM (COUMADIN PO)    Take 3 mg by mouth daily.    ZINC OXIDE (SECURA EXTRA PROTECTIVE EX)    Apply topically as needed. Apply to sacrum and buttocks  Modified Medications   No medications on file  Discontinued Medications   CONJUGATED ESTROGENS (PREMARIN) VAGINAL CREAM    Place 1 Applicatorful vaginally 2 (two) times a week. On Wednesdays and Fridays Q 12 AM per patient request   DICLOFENAC SODIUM (VOLTAREN) 1 % GEL    Apply 2 g topically 2 (two) times daily as needed. Apply to bilateral shoulders and knees BID PRN     Allergies  Allergen Reactions  . Remeron [Mirtazapine]      REVIEW OF SYSTEMS:  GENERAL: no change in appetite, no fatigue, no weight changes, no fever, chills or weakness EYES: Denies change in vision, dry eyes, eye pain, itching or discharge EARS: Denies change in hearing, ringing in ears, or earache NOSE: Denies nasal congestion or epistaxis MOUTH and THROAT: Denies oral discomfort, gingival pain or bleeding, pain from teeth or hoarseness   RESPIRATORY: no cough, SOB, DOE, wheezing, hemoptysis CARDIAC: no chest pain, or palpitations GI: no abdominal pain, diarrhea, constipation, heart burn, nausea or vomiting GU: Denies dysuria, frequency, hematuria, incontinence, or discharge PSYCHIATRIC: Denies feeling of depression or anxiety. No report of hallucinations, insomnia, paranoia, or agitation    PHYSICAL EXAMINATION  GENERAL APPEARANCE: Well nourished. In no acute distress.  Morbidly obese SKIN:  Skin is warm and dry.  HEAD: Normal in size and contour. No evidence of trauma EYES: Lids open and close normally. No blepharitis, entropion or ectropion. PERRL. Conjunctivae are clear and sclerae are white. Lenses are without opacity EARS: Pinnae are  normal. Patient hears normal voice tunes of the examiner MOUTH and THROAT: Lips are without lesions. Oral mucosa is moist and without lesions. Tongue is normal in shape, size, and color and without lesions NECK: supple, trachea midline, no neck masses, no thyroid tenderness, no thyromegaly LYMPHATICS: no LAN in the neck, no supraclavicular LAN RESPIRATORY: breathing is even & unlabored, BS CTAB CARDIAC: RRR, no murmur,no extra heart sounds, BLE 2+ edema GI: abdomen soft, normal BS, no masses, no tenderness, no hepatomegaly, no splenomegaly EXTREMITIES:  Able to move X 4 extremities PSYCHIATRIC: Alert and oriented X 3. Affect and behavior are appropriate  LABS/RADIOLOGY: Labs reviewed: Basic Metabolic Panel:  Recent Labs  16/10/96 09/03/16 01/22/17  NA 142 141 142  K 4.0 4.1 3.7  BUN 25* 32* 31*  CREATININE 1.1 1.5* 1.6*   Liver Function Tests:  Recent Labs  02/24/16 06/13/16 09/03/16  AST 16 15 26   ALT 11 10 19   ALKPHOS 145* 187* 180*   CBC:  Recent Labs  09/03/16 12/12/16 01/22/17  WBC 6.0 8.5 5.4  NEUTROABS 3 6 3   HGB 10.4* 11.6* 9.9*  HCT 34* 38 31*  PLT 134* 154 107*   Lipid Panel:  Recent Labs  02/22/16  HDL 29*    ASSESSMENT/PLAN:  Hyperthyroidism - recent imaging of neck showed possible Hashimoto thyroiditis or Graves' disease ;  decrease Tapazole from 10 mg twice to 5 mg Q D; thyroid panel in 1 month; endocrinology consult 01/30/17   tsh >50.400, total T3 22.10, free T4 0.17, TPO Ab 117.6  Anemia of chronic disease - stable Lab Results  Component Value Date   HGB 9.9 (A) 01/22/2017   Chronic diastolic CHF - no SOB; continue torsemide 20 mg 1 tab by mouth daily  Mood disorder -  Topamax was recently increased to 50 mg every morning and 100 mg by mouth daily at bedtime  Hypertension - well controlled; continue Corgard 40 mg 1 tab by mouth daily  CAD - no chest pain; continue aspirin EC 81 mg 1 tab by mouth daily and NTG when necessary    Goals of care:  Long-term care    Derek Huneycutt C. Medina-Vargas  -  NP [facility] [phone]

## 2017-02-21 LAB — BASIC METABOLIC PANEL
BUN: 40 mg/dL — AB (ref 4–21)
CREATININE: 2.7 mg/dL — AB (ref 0.5–1.1)
Glucose: 144 mg/dL
Potassium: 4 mmol/L (ref 3.4–5.3)
Sodium: 140 mmol/L (ref 137–147)

## 2017-02-22 IMAGING — CR DG CHEST 2V
2 series · 2 of 2 positions shown · non-contrast
Comparison: 11/25/2014

CLINICAL DATA: Chest pain for few days, bilateral feet swelling

EXAM:
CHEST  2 VIEW

[chest lat]
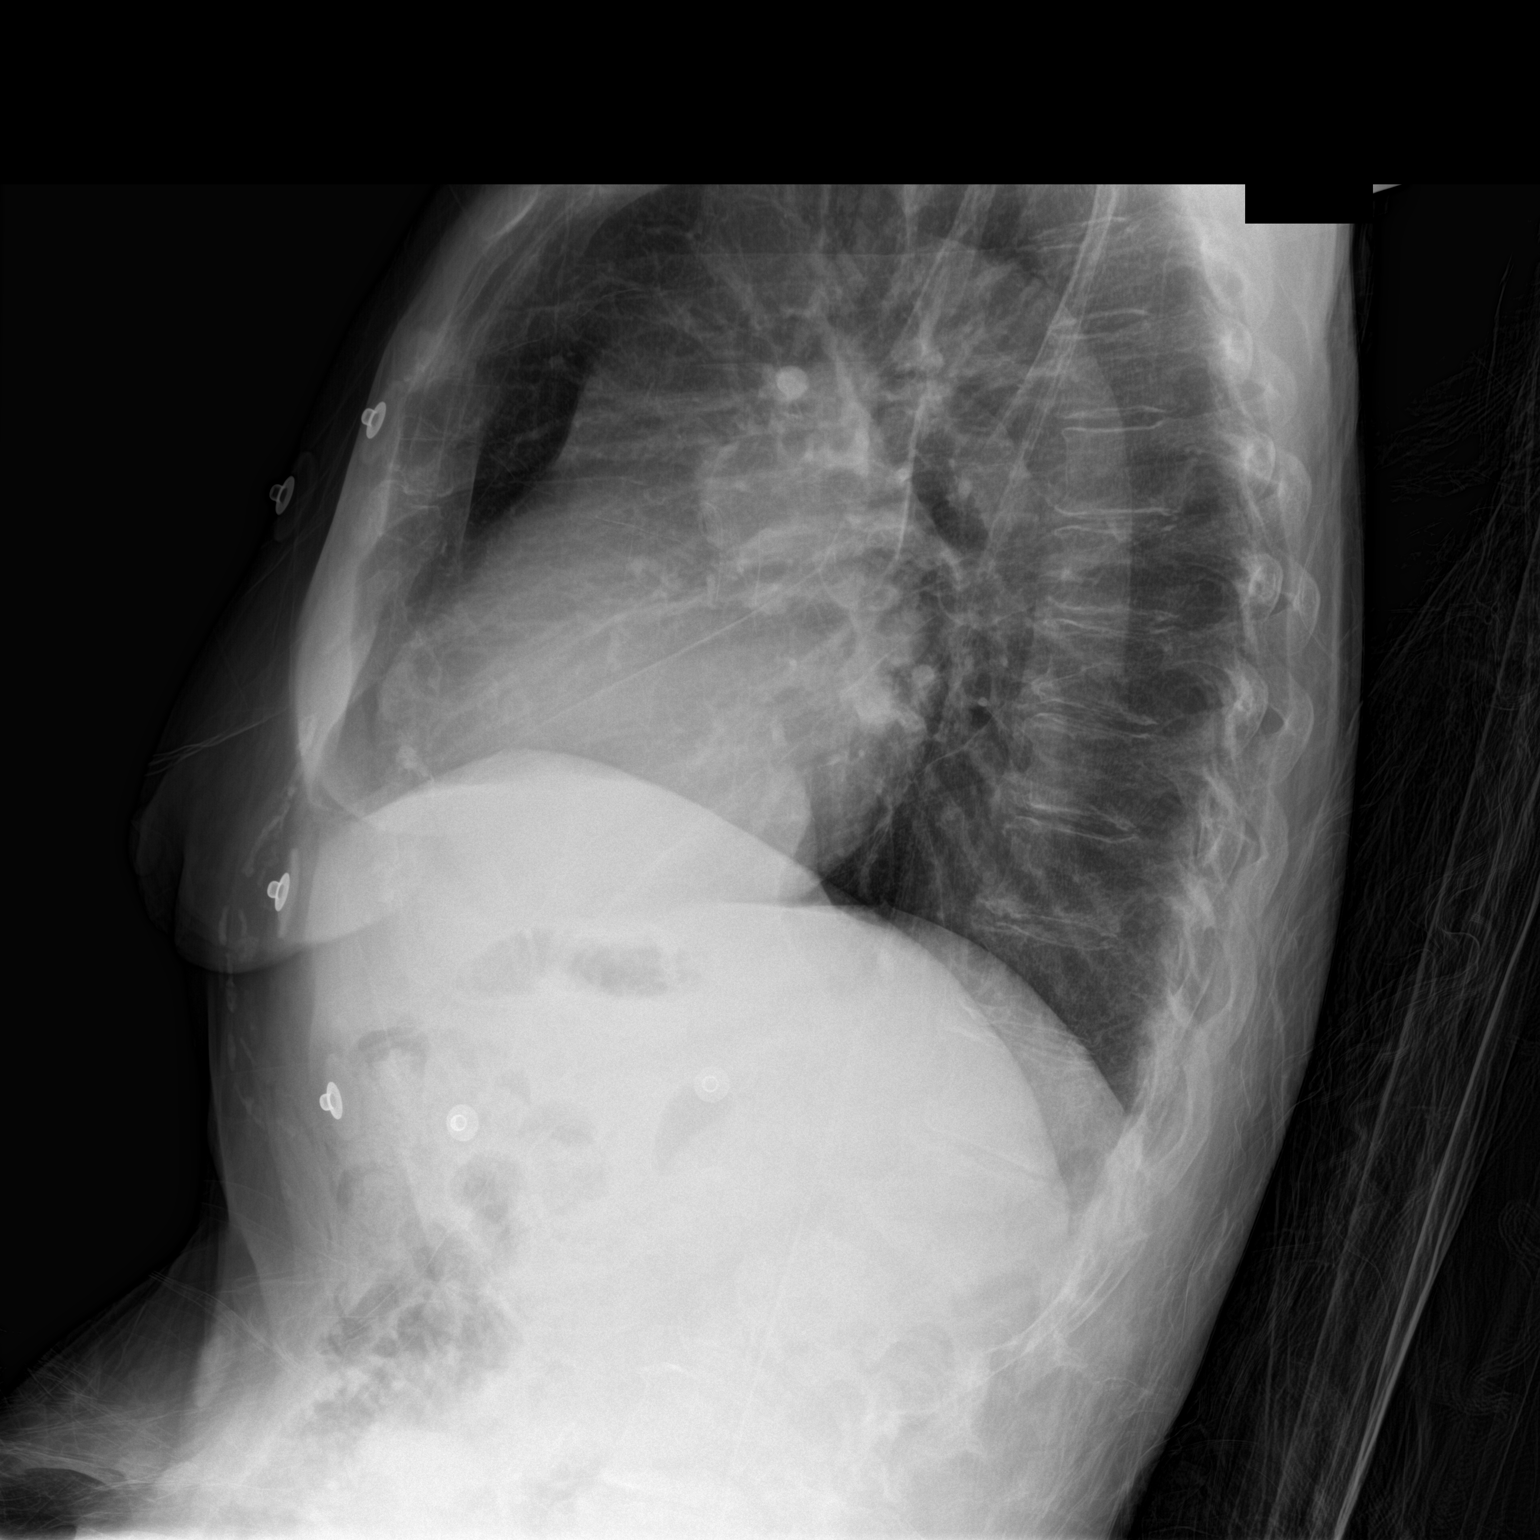

[chest ap]
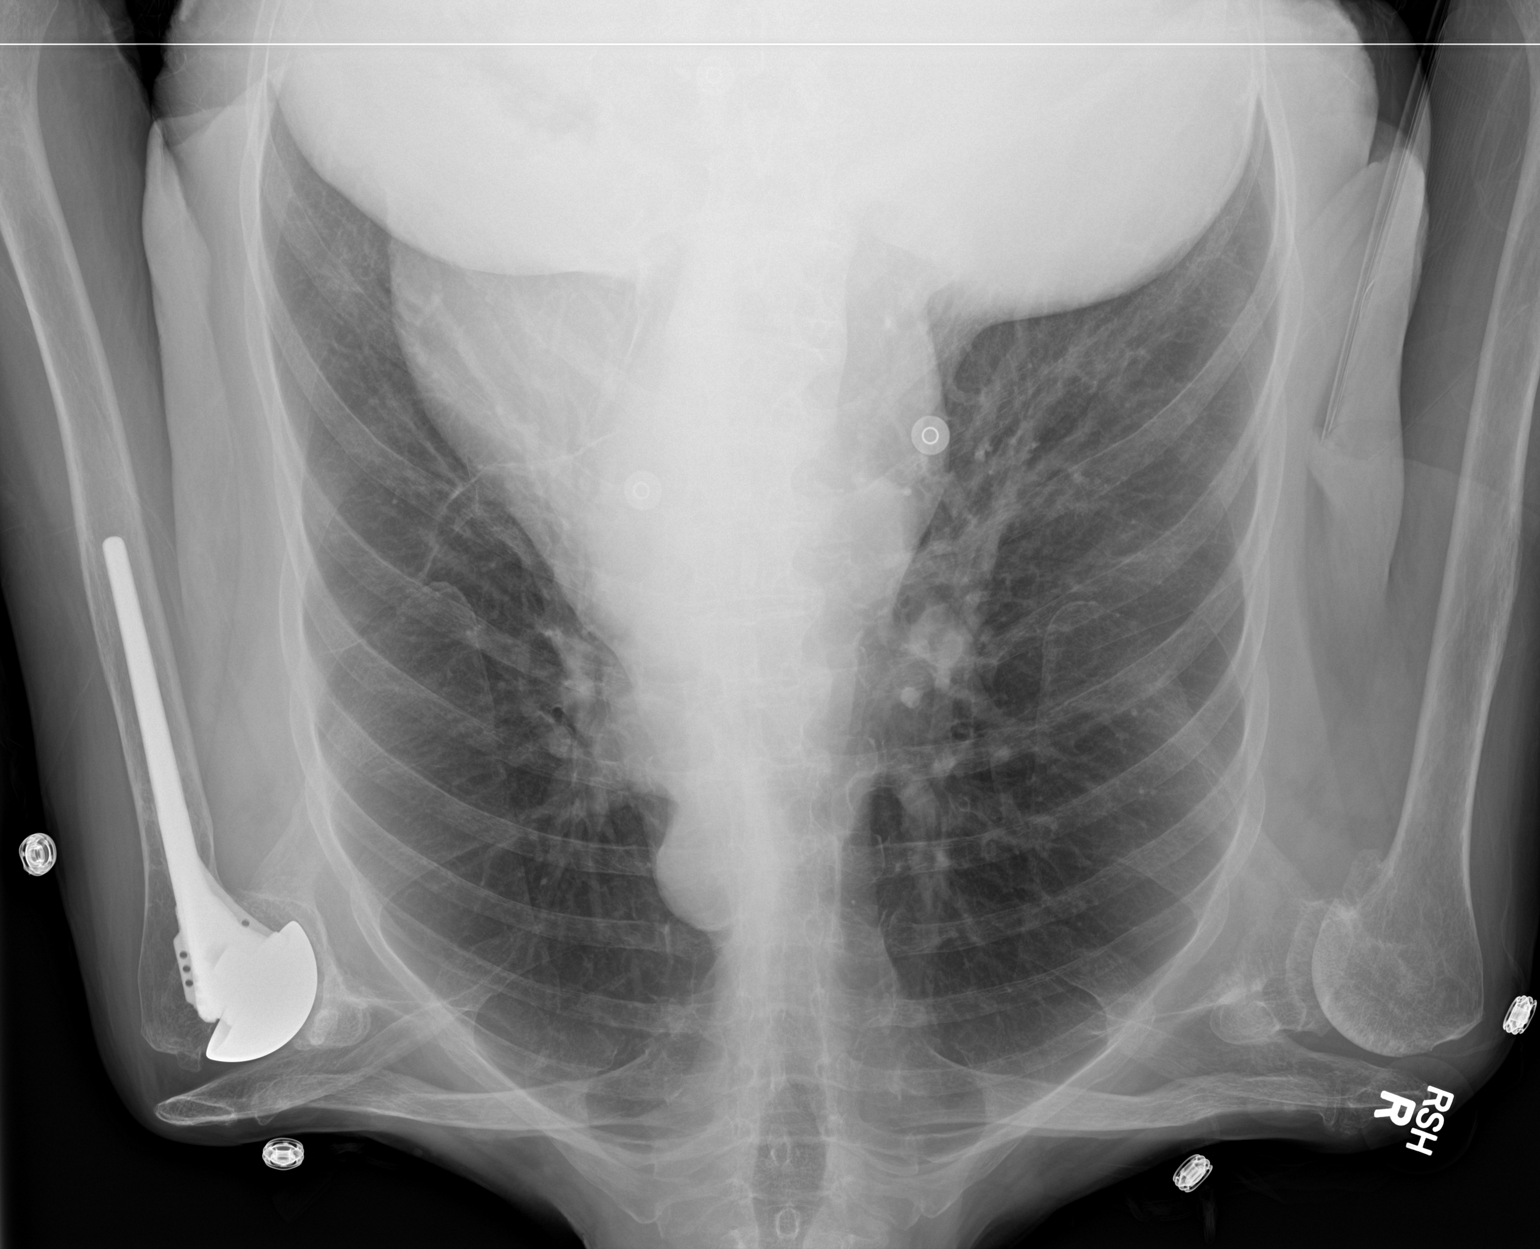

[2 of 2 positions shown; findings below may reference images not displayed]

FINDINGS: Cardiomediastinal silhouette is stable. No acute infiltrate or
pleural effusion. No pulmonary edema. Mild degenerative changes
thoracic spine. Mild degenerative changes right shoulder. Left
shoulder prosthesis.
IMPRESSION: No active cardiopulmonary disease.

## 2017-03-07 ENCOUNTER — Ambulatory Visit (INDEPENDENT_AMBULATORY_CARE_PROVIDER_SITE_OTHER): Payer: Medicare Other | Admitting: Endocrinology

## 2017-03-07 ENCOUNTER — Encounter: Payer: Self-pay | Admitting: Endocrinology

## 2017-03-07 VITALS — BP 126/72 | HR 67

## 2017-03-07 DIAGNOSIS — I25119 Atherosclerotic heart disease of native coronary artery with unspecified angina pectoris: Secondary | ICD-10-CM

## 2017-03-07 DIAGNOSIS — E0590 Thyrotoxicosis, unspecified without thyrotoxic crisis or storm: Secondary | ICD-10-CM

## 2017-03-07 LAB — TSH: TSH: 5.06 u[IU]/mL — ABNORMAL HIGH (ref 0.35–4.50)

## 2017-03-07 LAB — T4, FREE: Free T4: 0.92 ng/dL (ref 0.60–1.60)

## 2017-03-07 NOTE — Patient Instructions (Signed)
blood tests are requested for you today.  We'll let you know about the results.  Based on the results, we'll know what we need to do next with the thyroid.   Please come back for a follow-up appointment in 1 month.

## 2017-03-07 NOTE — Progress Notes (Signed)
Subjective:    Patient ID: Katie Evans, female    DOB: 10-15-1946, 71 y.o.   MRN: [id number]  HPI Pt is referred by Dr Glade Lloyd, for hyperthyroidism.  Pt reports she was dx'ed with hyperthyroidism in 2016.  she has been on tapazole since dx.  In March of 2018, TSH was very high.  Tapazole is not on current med list, so it is presumed to have been d/c'ed then.  she has never had XRT to the anterior neck, or thyroid surgery.  she has never had thyroid imaging.  she does not consume kelp or any other prescribed or non-prescribed thyroid medication.  she has never been on amiodarone.  She reports slight tremor of the hands, and assoc muscle weakness.   Past Medical History:  Diagnosis Date  . Allergic rhinitis   . Anemia   . Anxiety   . Arthritis   . Bruising   . Carpal tunnel syndrome   . CHF (congestive heart failure) (HCC)   . Chronic diarrhea   . Chronic pulmonary embolism (HCC)   . Demand ischemia of myocardium (HCC)   . Flatulence   . Generalized anxiety disorder   . GERD without esophagitis   . Hemorrhoids   . Hyperlipidemia   . Hypertension   . Hyperthyroidism   . Hypokalemia   . Long term current use of anticoagulant therapy   . Major depressive disorder   . Metabolic encephalopathy   . Overactive bladder   . Protein calorie malnutrition (HCC)   . Psychosis   . Renal disorder   . RLS (restless legs syndrome)   . Thrombocytopathia (HCC)   . Thrombocytopenia (HCC)   . Vaginal atrophy     Past Surgical History:  Procedure Laterality Date  . ABDOMINAL HYSTERECTOMY    . APPENDECTOMY    . BACK SURGERY    . GASTRIC BYPASS    . SHOULDER SURGERY    . SPINE SURGERY     spinal fusion  . TONSILLECTOMY      Social History   Social History  . Marital status: Single    Spouse name: N/A  . Number of children: N/A  . Years of education: N/A   Occupational History  . Not on file.   Social History Main Topics  . Smoking status: Never Smoker  . Smokeless tobacco:  Never Used  . Alcohol use No  . Drug use: No  . Sexual activity: Not on file   Other Topics Concern  . Not on file   Social History Narrative  . No narrative on file    Current Outpatient Prescriptions on File Prior to Visit  Medication Sig Dispense Refill  . atorvastatin (LIPITOR) 20 MG tablet Take 20 mg by mouth daily.    . Cholecalciferol (D3-1000 PO) Take 1,000 Units by mouth daily.     . clonazePAM (KLONOPIN) 1 MG tablet Take 1 mg by mouth at bedtime.    . famotidine (PEPCID) 20 MG tablet Take 20 mg by mouth daily.     Marland Kitchen HYDROcodone-acetaminophen (NORCO/VICODIN) 5-325 MG tablet Take one to two tablets by mouth every 4 hours as needed for pain. Do not exceed 4gm of Tylenol in 24 hours 180 tablet 0  . Hypromellose (ARTIFICIAL TEARS OP) Apply to eye 3 (three) times daily as needed. Both eyes    . lidocaine (LIDODERM) 5 % Place 1 patch onto the skin daily. Remove & Discard patch within 12 hours or as directed by MD. Apply to posterior  neck.    . loratadine (CLARITIN) 10 MG tablet Take 10 mg by mouth daily as needed.     . nadolol (CORGARD) 40 MG tablet Take 40 mg by mouth daily.     . nitroGLYCERIN (NITROSTAT) 0.4 MG SL tablet Place 0.4 mg under the tongue every 5 (five) minutes as needed for chest pain. For 3 doses as needed for chest pain    . oxybutynin (DITROPAN) 5 MG tablet Take 5 mg by mouth daily.     . potassium chloride SA (K-DUR,KLOR-CON) 20 MEQ tablet Take 20 mEq by mouth 2 (two) times daily. Take with food.  Do not crush.  May dissolve.    . pramipexole (MIRAPEX) 1.5 MG tablet Take 1.5 mg by mouth 2 (two) times daily.     . sertraline (ZOLOFT) 100 MG tablet Take 200 mg by mouth daily. Take two 100 mg tablets to = 200 mg po QD for depression    . topiramate (TOPAMAX) 50 MG tablet Take 50-100 mg by mouth. Take 1 tablet QAM, 2 tablets to = 100 mg QHS    . torsemide (DEMADEX) 20 MG tablet Take 20 mg by mouth daily.     . Warfarin Sodium (COUMADIN PO) Take 3 mg by mouth daily.      Marland Kitchen aspirin 81 MG chewable tablet Chew 81 mg by mouth daily.     . Bismuth Subgallate (DEVROM) 200 MG CHEW Chew 1 tablet by mouth daily.     Marland Kitchen gabapentin (NEURONTIN) 100 MG capsule Take 100 mg by mouth at bedtime.    Marland Kitchen loperamide (IMODIUM) 2 MG capsule Take 2 mg by mouth 4 (four) times daily as needed for diarrhea or loose stools.     . Menthol, Topical Analgesic, (ICY HOT EX) Apply 1 application topically. Apply 7.5% cream to both shoulders at 9AM, 3PM, 9PM.  Hold for skin irritation.    . polycarbophil (FIBERCON) 625 MG tablet Take 625 mg by mouth daily as needed (chronic diarrhea).     . shark liver oil-cocoa butter (PREPARATION H) 0.25-3-85.5 % suppository Place 1 suppository rectally 2 (two) times daily as needed for hemorrhoids.    . Zinc Oxide (SECURA EXTRA PROTECTIVE EX) Apply topically as needed. Apply to sacrum and buttocks     No current facility-administered medications on file prior to visit.     Allergies  Allergen Reactions  . Remeron [Mirtazapine]    Family History  Problem Relation Age of Onset  . Thyroid disease Neg Hx    BP 126/72   Pulse 67   Review of Systems denies weight loss, headache, hoarseness, visual loss, palpitations, sob, polyuria, excessive diaphoresis, seizure, anxiety, heat intolerance, and rhinorrhea. She has easy bruising.  She has intermittent diarrhea.    Objective:   Physical Exam  VS: see vs page GEN: no distress.  In wheelchair. HEAD: head: no deformity eyes: no periorbital swelling, no proptosis external nose and ears are normal mouth: no lesion seen NECK: supple, thyroid is not enlarged CHEST WALL: no deformity LUNGS: clear to auscultation CV: reg rate and rhythm, no murmur.  1+ bilat leg edema ABD: abdomen is soft, nontender.  no hepatosplenomegaly.  not distended.  no hernia MUSCULOSKELETAL: muscle bulk and strength are grossly normal.  no obvious joint swelling.  gait is normal and steady EXTEMITIES: no deformity.  PULSES: no  carotid bruit NEURO:  cn 2-12 grossly intact.   readily moves all 4's.  sensation is intact to touch on all 4's.  Slight fine  tremor of the hands.  SKIN:  Normal texture and temperature.  No rash or suspicious lesion is visible.   NODES:  None palpable at the neck PSYCH: alert, well-oriented.  Does not appear anxious nor depressed.  outside test results are reviewed: TSH (01/30/17) >50  I have reviewed outside records, and summarized: Pt was noted to have severely elevated TSH, and referred here.  It is not clear that tapazole was stopped the same day.    Lab Results  Component Value Date   TSH 5.06 (H) 03/07/2017      Assessment & Plan:  Hyperthyroidism: still slightly overcontrolled.  I presume tapazole was stopped 5 weeks ago.  Stay off tapazole. Please come back for a follow-up appointment in 1 month.

## 2017-03-22 ENCOUNTER — Encounter: Payer: Self-pay | Admitting: Internal Medicine

## 2017-03-22 ENCOUNTER — Non-Acute Institutional Stay (SKILLED_NURSING_FACILITY): Payer: Medicare Other | Admitting: Internal Medicine

## 2017-03-22 DIAGNOSIS — N189 Chronic kidney disease, unspecified: Secondary | ICD-10-CM

## 2017-03-22 DIAGNOSIS — R400 Somnolence: Secondary | ICD-10-CM

## 2017-03-22 DIAGNOSIS — E032 Hypothyroidism due to medicaments and other exogenous substances: Secondary | ICD-10-CM | POA: Diagnosis not present

## 2017-03-22 DIAGNOSIS — N179 Acute kidney failure, unspecified: Secondary | ICD-10-CM | POA: Diagnosis not present

## 2017-03-22 NOTE — Progress Notes (Signed)
LOCATION: [facility]  PCP: Oneal Grout, MD   Code Status: DNR  Goals of care: Advanced Directive information No flowsheet data found.    Extended Emergency Contact Information Primary Emergency Contact: Grast,Lauran Address: [address]           [place], [place] [address] [place] of [place] Home Phone: [phone] Work Phone: [phone] Mobile Phone: [phone] Relation: Sister Secondary Emergency Contact: Pearson [place] of [place] Mobile Phone: [phone] Relation: Sister   Allergies  Allergen Reactions  . Remeron [Mirtazapine]     Chief Complaint  Patient presents with  . Medical Management of Chronic Issues    Routine Visit      HPI:  Patient is a 71 y.o. female seen today for routine visit. She appears confused and minimally participates in conversation. Per nursing, she has been confused x 2 weeks. 2 weeks back, on workup, she was diagnosed with klebsiella UTI and treated with antibiotics x 1 week. She made clinical improvement but now has increased confusion again. No fall reported. Per nursing, increased daytime sleepiness and poor oral intake.   Review of Systems: limited Constitutional: Negative for fever HENT: Negative for headache, congestion Respiratory: Negative for cough, SOB. Cardiovascular: Negative for chest pain. Positive for leg swelling.  Gastrointestinal: Negative for vomiting, abdominal pain.     Past Medical History:  Diagnosis Date  . Allergic rhinitis   . Anemia   . Anxiety   . Arthritis   . Bruising   . Carpal tunnel syndrome   . CHF (congestive heart failure) (HCC)   . Chronic diarrhea   . Chronic pulmonary embolism (HCC)   . Demand ischemia of myocardium (HCC)   . Flatulence   . Generalized anxiety disorder   . GERD without esophagitis   . Hemorrhoids   . Hyperlipidemia   . Hypertension   . Hyperthyroidism   . Hypokalemia   . Long term current use of anticoagulant therapy   . Major  depressive disorder   . Metabolic encephalopathy   . Overactive bladder   . Protein calorie malnutrition (HCC)   . Psychosis   . Renal disorder   . RLS (restless legs syndrome)   . Thrombocytopathia (HCC)   . Thrombocytopenia (HCC)   . Vaginal atrophy    Past Surgical History:  Procedure Laterality Date  . ABDOMINAL HYSTERECTOMY    . APPENDECTOMY    . BACK SURGERY    . GASTRIC BYPASS    . SHOULDER SURGERY    . SPINE SURGERY     spinal fusion  . TONSILLECTOMY      Medications: Allergies as of 03/22/2017      Reactions   Remeron [mirtazapine]       Medication List       Accurate as of 03/22/17  1:33 PM. Always use your most recent med list.          ARTIFICIAL TEARS OP Apply to eye 3 (three) times daily as needed. Both eyes   aspirin 81 MG chewable tablet Chew 81 mg by mouth daily.   atorvastatin 20 MG tablet Commonly known as:  LIPITOR Take 20 mg by mouth daily.   clonazePAM 1 MG tablet Commonly known as:  KLONOPIN Take 1 mg by mouth at bedtime.   COUMADIN PO Take 2 mg by mouth daily.   cyclobenzaprine 5 MG tablet Commonly known as:  FLEXERIL Take 5 mg by mouth 3 (three) times daily as needed for muscle spasms.   D3-1000  PO Take 1,000 Units by mouth daily.   famotidine 20 MG tablet Commonly known as:  PEPCID Take 20 mg by mouth daily.   HYDROcodone-acetaminophen 5-325 MG tablet Commonly known as:  NORCO/VICODIN Take one to two tablets by mouth every 4 hours as needed for pain. Do not exceed 4gm of Tylenol in 24 hours   ICY HOT EX Apply 1 application topically. Apply 7.5% cream to both shoulders at 9AM, 3PM, 9PM.  Hold for skin irritation.   lidocaine 5 % Commonly known as:  LIDODERM Place 1 patch onto the skin daily. Remove & Discard patch within 12 hours or as directed by MD. Apply to posterior neck.   loperamide 2 MG capsule Commonly known as:  IMODIUM Take 2 mg by mouth 4 (four) times daily as needed for diarrhea or loose stools.     loratadine 10 MG tablet Commonly known as:  CLARITIN Take 10 mg by mouth daily as needed.   nadolol 40 MG tablet Commonly known as:  CORGARD Take 40 mg by mouth daily.   nitroGLYCERIN 0.4 MG SL tablet Commonly known as:  NITROSTAT Place 0.4 mg under the tongue every 5 (five) minutes as needed for chest pain. For 3 doses as needed for chest pain   oxybutynin 5 MG tablet Commonly known as:  DITROPAN Take 5 mg by mouth daily.   polycarbophil 625 MG tablet Commonly known as:  FIBERCON Take 625 mg by mouth daily as needed (chronic diarrhea).   potassium chloride SA 20 MEQ tablet Commonly known as:  K-DUR,KLOR-CON Take 20 mEq by mouth 2 (two) times daily. Take with food.  Do not crush.  May dissolve.   pramipexole 1.5 MG tablet Commonly known as:  MIRAPEX Take 1.5 mg by mouth 2 (two) times daily.   SECURA EXTRA PROTECTIVE EX Apply topically as needed. Apply to sacrum and buttocks   sertraline 100 MG tablet Commonly known as:  ZOLOFT Take 200 mg by mouth daily. Take two 100 mg tablets to = 200 mg po QD for depression   shark liver oil-cocoa butter 0.25-3-85.5 % suppository Commonly known as:  PREPARATION H Place 1 suppository rectally 2 (two) times daily as needed for hemorrhoids.   topiramate 50 MG tablet Commonly known as:  TOPAMAX Take 50-100 mg by mouth. Take 1 tablet QAM, 2 tablets to = 100 mg QHS   torsemide 20 MG tablet Commonly known as:  DEMADEX Take 20 mg by mouth daily.       Immunizations: There is no immunization history for the selected administration types on file for this patient.   Physical Exam: Vitals:   03/22/17 1321  BP: 118/61  Pulse: 67  Resp: 18  Temp: 97.3 F (36.3 C)  TempSrc: Oral  SpO2: 97%  Weight: 214 lb (97.1 kg)  Height:  (1.575 m)   Body mass index is 39.14 kg/m.  General- elderly female, obese, in no acute distress Head- normocephalic, atraumatic Nose- no nasal discharge Throat- moist mucus membrane  Eyes-  PERRLA, EOMI, no pallor, no icterus Neck- no cervical lymphadenopathy Cardiovascular- normal s1,s2, no murmur, 1+ leg edema present Respiratory- bilateral equal air entry, no wheeze, no rhonchi, no crackles, no use of accessory muscles Abdomen- bowel sounds present, soft, non tender Musculoskeletal- able to move all 4 extremities, generalized weakness Neurological- alert and oriented to self only  Skin- warm and dry Psychiatry- normal mood and affect    Labs reviewed: Basic Metabolic Panel:  Recent Labs  16/10/96 01/22/17 02/21/17  NA 141 142 140  K 4.1 3.7 4.0  BUN 32* 31* 40*  CREATININE 1.5* 1.6* 2.7*   Liver Function Tests:  Recent Labs  06/13/16 09/03/16  AST 15 26  ALT 10 19  ALKPHOS 187* 180*    CBC:  Recent Labs  09/03/16 12/12/16 01/22/17  WBC 6.0 8.5 5.4  NEUTROABS HGB 10.4* 11.6* 9.9*  HCT 34* 38 31*  PLT 134* 154 107*      Assessment/Plan  Altered mental state New finding with increased somnolence. Will perform infectious workup. Labs from few weeks back shows acute on chronic renal failure. Send cbc with diff and cmp stat, u/a with c/s. With her recent history of UTI, treat her empirically with nitrofurantoin 100 mg q12h x 10 days. Decrease klonopin to 0.5 mg qhs for now and topamax to 50 mg bid.   Acute on chronic renal failure labs on review shows worsening renal function. Her torsemide dosing was decreased by APP. Will need follow up lab. Monitor urine output and send stat bmp  hypothyroidism Lab Results  Component Value Date   TSH 5.06 (H) 03/07/2017   Iatrogenic hypothyroidism, off tapazole. To follow with endocrinology.    Oneal Grout, MD Internal Medicine [facility] [address] [place], [place] [address] Cell Phone (Monday-Friday 8 am - 5 pm): [phone] On Call: [phone] and follow prompts after 5 pm and on weekends Office Phone: [phone] Office Fax: [phone]

## 2017-03-26 LAB — BASIC METABOLIC PANEL
BUN: 29 mg/dL — AB (ref 4–21)
CREATININE: 1.5 mg/dL — AB (ref 0.5–1.1)
Glucose: 168 mg/dL
Potassium: 4.3 mmol/L (ref 3.4–5.3)
Sodium: 138 mmol/L (ref 137–147)

## 2017-03-26 LAB — HEPATIC FUNCTION PANEL
ALT: 14 U/L (ref 7–35)
AST: 18 U/L (ref 13–35)
Alkaline Phosphatase: 173 U/L — AB (ref 25–125)
BILIRUBIN, TOTAL: 0.6 mg/dL

## 2017-03-26 LAB — CBC AND DIFFERENTIAL
HCT: 39 % (ref 36–46)
HEMOGLOBIN: 12.2 g/dL (ref 12.0–16.0)
PLATELETS: 137 10*3/uL — AB (ref 150–399)
WBC: 6.6 10*3/mL

## 2017-04-05 ENCOUNTER — Ambulatory Visit (INDEPENDENT_AMBULATORY_CARE_PROVIDER_SITE_OTHER): Payer: Medicare Other | Admitting: Endocrinology

## 2017-04-05 ENCOUNTER — Encounter: Payer: Self-pay | Admitting: Endocrinology

## 2017-04-05 VITALS — BP 118/68 | HR 79 | Ht 62.0 in

## 2017-04-05 DIAGNOSIS — E0590 Thyrotoxicosis, unspecified without thyrotoxic crisis or storm: Secondary | ICD-10-CM | POA: Diagnosis not present

## 2017-04-05 LAB — TSH: TSH: 0.06 u[IU]/mL — ABNORMAL LOW (ref 0.35–4.50)

## 2017-04-05 LAB — T4, FREE: FREE T4: 2.44 ng/dL — AB (ref 0.60–1.60)

## 2017-04-05 NOTE — Patient Instructions (Addendum)
blood tests are requested for you today.  We'll let you know about the results.  If it has gone overactive again, we'll resume the methimazole at a lower amount.   Please come back for a follow-up appointment in 2 months.

## 2017-04-05 NOTE — Progress Notes (Signed)
Subjective:    Patient ID: Katie Evans, female    DOB: 08-25-1946, 71 y.o.   MRN: [id number]  HPI Pt returns for f/u of hyperthyroidism (dx'ed 2016; she has been on tapazole since dx; in March of 2018, TSH was very high; when pt was first seen here in April of 2018, tapazole was not on med list, so it is presumed to have been d/c'ed then; she has never had thyroid imaging).  Off tapazole, she says she has lost weight.  Past Medical History:  Diagnosis Date  . Allergic rhinitis   . Anemia   . Anxiety   . Arthritis   . Bruising   . Carpal tunnel syndrome   . CHF (congestive heart failure) (HCC)   . Chronic diarrhea   . Chronic pulmonary embolism (HCC)   . Demand ischemia of myocardium (HCC)   . Flatulence   . Generalized anxiety disorder   . GERD without esophagitis   . Hemorrhoids   . Hyperlipidemia   . Hypertension   . Hyperthyroidism   . Hypokalemia   . Long term current use of anticoagulant therapy   . Major depressive disorder   . Metabolic encephalopathy   . Overactive bladder   . Protein calorie malnutrition (HCC)   . Psychosis   . Renal disorder   . RLS (restless legs syndrome)   . Thrombocytopathia (HCC)   . Thrombocytopenia (HCC)   . Vaginal atrophy     Past Surgical History:  Procedure Laterality Date  . ABDOMINAL HYSTERECTOMY    . APPENDECTOMY    . BACK SURGERY    . GASTRIC BYPASS    . SHOULDER SURGERY    . SPINE SURGERY     spinal fusion  . TONSILLECTOMY      Social History   Social History  . Marital status: Single    Spouse name: N/A  . Number of children: N/A  . Years of education: N/A   Occupational History  . Not on file.   Social History Main Topics  . Smoking status: Never Smoker  . Smokeless tobacco: Never Used  . Alcohol use No  . Drug use: No  . Sexual activity: Not on file   Other Topics Concern  . Not on file   Social History Narrative  . No narrative on file    Current Outpatient Prescriptions on File Prior to  Visit  Medication Sig Dispense Refill  . aspirin 81 MG chewable tablet Chew 81 mg by mouth daily.     Marland Kitchen atorvastatin (LIPITOR) 20 MG tablet Take 20 mg by mouth daily.    . Cholecalciferol (D3-1000 PO) Take 1,000 Units by mouth daily.     . clonazePAM (KLONOPIN) 1 MG tablet Take 1 mg by mouth at bedtime.    . cyclobenzaprine (FLEXERIL) 5 MG tablet Take 5 mg by mouth 3 (three) times daily as needed for muscle spasms.    . famotidine (PEPCID) 20 MG tablet Take 20 mg by mouth daily.     Marland Kitchen HYDROcodone-acetaminophen (NORCO/VICODIN) 5-325 MG tablet Take one to two tablets by mouth every 4 hours as needed for pain. Do not exceed 4gm of Tylenol in 24 hours 180 tablet 0  . Hypromellose (ARTIFICIAL TEARS OP) Apply to eye 3 (three) times daily as needed. Both eyes    . lidocaine (LIDODERM) 5 % Place 1 patch onto the skin daily. Remove & Discard patch within 12 hours or as directed by MD. Apply to posterior neck.    Marland Kitchen  loperamide (IMODIUM) 2 MG capsule Take 2 mg by mouth 4 (four) times daily as needed for diarrhea or loose stools.     Marland Kitchen. loratadine (CLARITIN) 10 MG tablet Take 10 mg by mouth daily as needed.     . Menthol, Topical Analgesic, (ICY HOT EX) Apply 1 application topically. Apply 7.5% cream to both shoulders at 9AM, 3PM, 9PM.  Hold for skin irritation.    . nadolol (CORGARD) 40 MG tablet Take 40 mg by mouth daily.     . nitroGLYCERIN (NITROSTAT) 0.4 MG SL tablet Place 0.4 mg under the tongue every 5 (five) minutes as needed for chest pain. For 3 doses as needed for chest pain    . oxybutynin (DITROPAN) 5 MG tablet Take 5 mg by mouth daily.     . polycarbophil (FIBERCON) 625 MG tablet Take 625 mg by mouth daily as needed (chronic diarrhea).     . potassium chloride SA (K-DUR,KLOR-CON) 20 MEQ tablet Take 20 mEq by mouth 2 (two) times daily. Take with food.  Do not crush.  May dissolve.    . pramipexole (MIRAPEX) 1.5 MG tablet Take 1.5 mg by mouth 2 (two) times daily.     . sertraline (ZOLOFT) 100 MG  tablet Take 200 mg by mouth daily. Take two 100 mg tablets to = 200 mg po QD for depression    . shark liver oil-cocoa butter (PREPARATION H) 0.25-3-85.5 % suppository Place 1 suppository rectally 2 (two) times daily as needed for hemorrhoids.    . topiramate (TOPAMAX) 50 MG tablet Take 50-100 mg by mouth. Take 1 tablet QAM, 2 tablets to = 100 mg QHS    . torsemide (DEMADEX) 20 MG tablet Take 20 mg by mouth daily.     . Warfarin Sodium (COUMADIN PO) Take 2 mg by mouth daily.     . Zinc Oxide (SECURA EXTRA PROTECTIVE EX) Apply topically as needed. Apply to sacrum and buttocks     No current facility-administered medications on file prior to visit.     Allergies  Allergen Reactions  . Remeron [Mirtazapine]     Family History  Problem Relation Age of Onset  . Thyroid disease Neg Hx     BP 118/68 (BP Location: Left Arm, Patient Position: Sitting, Cuff Size: Normal)   Pulse 79   Ht 5\' 2"  (1.575 m)   SpO2 93%    Review of Systems She has slight tremor.      Objective:   Physical Exam VITAL SIGNS:  See vs page GENERAL: no distress NECK: There is no palpable thyroid enlargement.  No thyroid nodule is palpable.  No palpable lymphadenopathy at the anterior neck.   Lab Results  Component Value Date   TSH 0.06 (L) 04/05/2017      Assessment & Plan:  Hyperthyroidism, recurrent off rx:  Resume tapazole at 10/d.

## 2017-04-06 MED ORDER — METHIMAZOLE 10 MG PO TABS: 10.0000 mg | ORAL_TABLET | Freq: Every day | ORAL | 11 refills | Status: DC

## 2017-04-10 LAB — CBC AND DIFFERENTIAL
HEMATOCRIT: 35 % — AB (ref 36–46)
Hemoglobin: 10.9 g/dL — AB (ref 12.0–16.0)
NEUTROS ABS: 4349 /uL
PLATELETS: 100 10*3/uL — AB (ref 150–399)
WBC: 6.5 10^3/mL

## 2017-04-10 LAB — BASIC METABOLIC PANEL
BUN: 61 mg/dL — AB (ref 4–21)
Creatinine: 1.9 mg/dL — AB (ref 0.5–1.1)
GLUCOSE: 144 mg/dL
Potassium: 3.8 mmol/L (ref 3.4–5.3)
Sodium: 136 mmol/L — AB (ref 137–147)

## 2017-04-11 LAB — BASIC METABOLIC PANEL
BUN: 53 mg/dL — AB (ref 4–21)
CREATININE: 1.7 mg/dL — AB (ref 0.5–1.1)
GLUCOSE: 121 mg/dL
POTASSIUM: 3.8 mmol/L (ref 3.4–5.3)
SODIUM: 138 mmol/L (ref 137–147)

## 2017-04-12 ENCOUNTER — Non-Acute Institutional Stay (SKILLED_NURSING_FACILITY): Payer: Medicare Other | Admitting: Adult Health

## 2017-04-12 ENCOUNTER — Encounter: Payer: Self-pay | Admitting: Adult Health

## 2017-04-12 DIAGNOSIS — I10 Essential (primary) hypertension: Secondary | ICD-10-CM | POA: Diagnosis not present

## 2017-04-12 DIAGNOSIS — N189 Chronic kidney disease, unspecified: Secondary | ICD-10-CM | POA: Diagnosis not present

## 2017-04-12 DIAGNOSIS — N179 Acute kidney failure, unspecified: Secondary | ICD-10-CM

## 2017-04-12 DIAGNOSIS — E0590 Thyrotoxicosis, unspecified without thyrotoxic crisis or storm: Secondary | ICD-10-CM

## 2017-04-12 DIAGNOSIS — Z7901 Long term (current) use of anticoagulants: Secondary | ICD-10-CM | POA: Diagnosis not present

## 2017-04-12 DIAGNOSIS — Z86711 Personal history of pulmonary embolism: Secondary | ICD-10-CM

## 2017-04-12 NOTE — Progress Notes (Signed)
DATE:  04/12/2017   MRN:  [id number]  BIRTHDAY: 04/20/1946  Facility:  Nursing Home [facility]  Nursing Home Room Number: 1005-B  LEVEL OF CARE:  SNF [phone])  Contact Information    Name Relation Home Work [place]   Grast,Lauran Sister [phone]   Fuller Planaylor,Dana Sister   [phone]       Code Status History    This patient does not have a recorded code status. Please follow your organizational policy for patients in this situation.       Chief Complaint  Patient presents with  . Medical Management of Chronic Issues    HISTORY OF PRESENT ILLNESS:  This is a 71-YO female seen for a routine visit.  She is a [facility]. She was seen in her room. She was noted to have good eye contact but confused to time and place. She was recently given IV fluids for noted creatinine of 1.87, GFR 32.6.  Post void residual was ordered but apparently refused.   PAST MEDICAL HISTORY:  Past Medical History:  Diagnosis Date  . Allergic rhinitis   . Anemia   . Anxiety   . Arthritis   . Bruising   . Carpal tunnel syndrome   . CHF (congestive heart failure) (HCC)   . Chronic diarrhea   . Chronic pulmonary embolism (HCC)   . Demand ischemia of myocardium (HCC)   . Flatulence   . Generalized anxiety disorder   . GERD without esophagitis   . Hemorrhoids   . Hyperlipidemia   . Hypertension   . Hyperthyroidism   . Hypokalemia   . Long term current use of anticoagulant therapy   . Major depressive disorder   . Metabolic encephalopathy   . Overactive bladder   . Protein calorie malnutrition (HCC)   . Psychosis   . Renal disorder   . RLS (restless legs syndrome)   . Thrombocytopathia (HCC)   . Thrombocytopenia (HCC)   . Vaginal atrophy      CURRENT MEDICATIONS: Reviewed  Patient's Medications  New Prescriptions   No medications on file  Previous Medications   ASPIRIN 81 MG  CHEWABLE TABLET    Chew 81 mg by mouth daily.    ATORVASTATIN (LIPITOR) 20 MG TABLET    Take 20 mg by mouth daily.   CHOLECALCIFEROL (D3-1000 PO)    Take 1,000 Units by mouth daily.    CLONAZEPAM (KLONOPIN) 0.5 MG TABLET    Take 0.5 mg by mouth at bedtime.   CYCLOBENZAPRINE (FLEXERIL) 5 MG TABLET    Take 5 mg by mouth 3 (three) times daily as needed for muscle spasms.   FAMOTIDINE (PEPCID) 20 MG TABLET    Take 20 mg by mouth daily.    HYDROCODONE-ACETAMINOPHEN (NORCO/VICODIN) 5-325 MG TABLET    Take one to two tablets by mouth every 4 hours as needed for pain. Do not exceed 4gm of Tylenol in 24 hours   HYPROMELLOSE (ARTIFICIAL TEARS OP)    Apply to eye 3 (three) times daily as needed. Both eyes   LIDOCAINE (LIDODERM) 5 %    Place 1 patch onto the skin daily. Remove & Discard patch within 12 hours or as directed by MD. Apply to posterior neck.   LOPERAMIDE (IMODIUM) 2 MG CAPSULE    Take 2 mg by mouth 4 (four) times daily as needed for diarrhea or loose stools.    LORATADINE (CLARITIN) 10 MG TABLET  Take 10 mg by mouth daily as needed.    MENTHOL, TOPICAL ANALGESIC, (ICY HOT EX)    Apply 1 application topically. Apply 7.5% cream to both shoulders at 9AM, 3PM, 9PM.  Hold for skin irritation.   METHIMAZOLE (TAPAZOLE) 10 MG TABLET    Take 1 tablet (10 mg total) by mouth daily.   NADOLOL (CORGARD) 40 MG TABLET    Take 40 mg by mouth daily.    NITROGLYCERIN (NITROSTAT) 0.4 MG SL TABLET    Place 0.4 mg under the tongue every 5 (five) minutes as needed for chest pain. For 3 doses as needed for chest pain   OXYBUTYNIN (DITROPAN) 5 MG TABLET    Take 5 mg by mouth daily.    POLYCARBOPHIL (FIBERCON) 625 MG TABLET    Take 625 mg by mouth daily as needed (chronic diarrhea).    POTASSIUM CHLORIDE SA (K-DUR,KLOR-CON) 20 MEQ TABLET    Take 20 mEq by mouth 2 (two) times daily. Take with food.  Do not crush.  May dissolve.   PRAMIPEXOLE (MIRAPEX) 1.5 MG TABLET    Take 1.5 mg by mouth 2 (two) times daily.     SERTRALINE (ZOLOFT) 100 MG TABLET    Take 200 mg by mouth daily. Take two 100 mg tablets to = 200 mg po QD for depression   SHARK LIVER OIL-COCOA BUTTER (PREPARATION H) 0.25-3-85.5 % SUPPOSITORY    Place 1 suppository rectally 2 (two) times daily as needed for hemorrhoids.   SODIUM CHLORIDE 0.9 % INFUSION    Inject 1,000 mLs into the vein continuous.   TOPIRAMATE (TOPAMAX) 50 MG TABLET    Take 50 mg by mouth 2 (two) times daily.    TORSEMIDE (DEMADEX) 20 MG TABLET    Take 20 mg by mouth daily.    WARFARIN SODIUM (COUMADIN PO)    Take 5 mg by mouth daily.    ZINC OXIDE (SECURA EXTRA PROTECTIVE EX)    Apply topically as needed. Apply to sacrum and buttocks  Modified Medications   No medications on file  Discontinued Medications   CLONAZEPAM (KLONOPIN) 1 MG TABLET    Take 1 mg by mouth at bedtime.     Allergies  Allergen Reactions  . Remeron [Mirtazapine]      REVIEW OF SYSTEMS:    GENERAL:  no fever, chills or weakness MOUTH and THROAT: Denies oral discomfort, gingival pain or bleeding, pain from teeth or hoarseness   RESPIRATORY: no cough, SOB, DOE, wheezing, hemoptysis CARDIAC: no chest pain, edema or palpitations GI: no abdominal pain, diarrhea, constipation, heart burn, nausea or vomiting GU: Denies dysuria, frequency, hematuria,  or discharge PSYCHIATRIC: Denies feeling of depression or anxiety. No report of hallucinations, insomnia, paranoia, or agitation     PHYSICAL EXAMINATION  GENERAL APPEARANCE: Well nourished. In no acute distress. Normal body habitus SKIN:  Skin is warm and dry.  HEAD: Normal in size and contour. No evidence of trauma EYES: Lids open and close normally. No blepharitis, entropion or ectropion. PERRL. Conjunctivae are clear and sclerae are white. Lenses are without opacity EARS: Pinnae are normal. Patient hears normal voice tunes of the examiner MOUTH and THROAT: Lips are without lesions. Oral mucosa is moist and without lesions. Tongue is normal in  shape, size, and color and without lesions NECK: supple, trachea midline, no neck masses, no thyroid tenderness, no thyromegaly LYMPHATICS: no LAN in the neck, no supraclavicular LAN RESPIRATORY: breathing is even & unlabored, BS CTAB CARDIAC: RRR, no murmur,no extra heart  sounds, no edema GI: abdomen soft, normal BS, no masses, no tenderness, no hepatomegaly, no splenomegaly EXTREMITIES:  Able to move X 4 extremities, BLE has generalized weakness PSYCHIATRIC: Alert to self, disoriented to time and place. Affect and behavior are appropriate   LABS/RADIOLOGY: Labs reviewed: Basic Metabolic Panel:  Recent Labs  16/10/96 04/10/17 04/11/17  NA 138 136* 138  K 4.3 3.8 3.8  BUN 29* 61* 53*  CREATININE 1.5* 1.9* 1.7*   Liver Function Tests:  Recent Labs  06/13/16 09/03/16 03/26/17  AST 15 26 18   ALT 10 19 14   ALKPHOS 187* 180* 173*   CBC:  Recent Labs  12/12/16 01/22/17 03/26/17 04/10/17  WBC 8.5 5.4 6.6 6.5  NEUTROABS 6 3  --  4,349  HGB 11.6* 9.9* 12.2 10.9*  HCT 38 31* 39 35*  PLT 154 107* 137* 100*    ASSESSMENT/PLAN:  Acute on chronic renal failure - S/P IV 0.9 NS 1 L infusion,  will repeat BMP and check postvoid residual, follow-up with nephrology Lab Results  Component Value Date   CREATININE 1.7 (A) 04/11/2017   Hyperthyroidism - recently restarted on Tapazole 10 mg 1 tab by mouth daily and will repeat TSH in 1 month Lab Results  Component Value Date   TSH 0.06 (L) 04/05/2017   Long-term use of anticoagulant - INR 2.9, therapeutic, will change Coumadin to 3 mg 1 tab by mouth daily and check INR on 04/15/17  History of pulmonary embolism - no SOB; continue Coumadin  Essential hypertension - well-controlled; continue Corgard 40 mg 1 tab by mouth daily     Goals of care:  Long-term care    Plez Belton C. Medina-Vargas - NP    [employer] [phone]

## 2017-04-16 ENCOUNTER — Non-Acute Institutional Stay (SKILLED_NURSING_FACILITY): Payer: Medicare Other

## 2017-04-16 DIAGNOSIS — Z0000 Encounter for general adult medical examination without abnormal findings: Secondary | ICD-10-CM | POA: Diagnosis not present

## 2017-04-16 NOTE — Progress Notes (Signed)
Subjective:   Katie Evans is a 71 y.o. female who presents for an Initial Medicare Annual Wellness Visit at [facility] long term [facility]        Objective:    Today's Vitals   04/16/17 1522  BP: 110/78  Pulse: 77  Temp: 97.2 F (36.2 C)  TempSrc: Oral  SpO2: 96%  Weight: 187 lb (84.8 kg)  Height: 5\' 2"  (1.575 m)   Body mass index is 34.2 kg/m.   Current Medications (verified) Outpatient Encounter Prescriptions as of 04/16/2017  Medication Sig  . aspirin 81 MG chewable tablet Chew 81 mg by mouth daily.   Marland Kitchen atorvastatin (LIPITOR) 20 MG tablet Take 20 mg by mouth daily.  . Cholecalciferol (D3-1000 PO) Take 1,000 Units by mouth daily.   . clonazePAM (KLONOPIN) 0.5 MG tablet Take 0.5 mg by mouth at bedtime.  . cyclobenzaprine (FLEXERIL) 5 MG tablet Take 5 mg by mouth 3 (three) times daily as needed for muscle spasms.  . famotidine (PEPCID) 20 MG tablet Take 20 mg by mouth daily.   Marland Kitchen HYDROcodone-acetaminophen (NORCO/VICODIN) 5-325 MG tablet Take one to two tablets by mouth every 4 hours as needed for pain. Do not exceed 4gm of Tylenol in 24 hours  . Hypromellose (ARTIFICIAL TEARS OP) Apply to eye 3 (three) times daily as needed. Both eyes  . lidocaine (LIDODERM) 5 % Place 1 patch onto the skin daily. Remove & Discard patch within 12 hours or as directed by MD. Apply to posterior neck.  Marland Kitchen loperamide (IMODIUM) 2 MG capsule Take 2 mg by mouth 4 (four) times daily as needed for diarrhea or loose stools.   Marland Kitchen loratadine (CLARITIN) 10 MG tablet Take 10 mg by mouth daily as needed.   . Menthol, Topical Analgesic, (ICY HOT EX) Apply 1 application topically. Apply 7.5% cream to both shoulders at 9AM, 3PM, 9PM.  Hold for skin irritation.  . methimazole (TAPAZOLE) 10 MG tablet Take 1 tablet (10 mg total) by mouth daily.  . nadolol (CORGARD) 40 MG tablet Take 40 mg by mouth daily.   . nitroGLYCERIN (NITROSTAT) 0.4 MG SL tablet Place 0.4 mg under the tongue every 5 (five) minutes as needed for  chest pain. For 3 doses as needed for chest pain  . oxybutynin (DITROPAN) 5 MG tablet Take 5 mg by mouth daily.   . polycarbophil (FIBERCON) 625 MG tablet Take 625 mg by mouth daily as needed (chronic diarrhea).   . potassium chloride SA (K-DUR,KLOR-CON) 20 MEQ tablet Take 20 mEq by mouth 2 (two) times daily. Take with food.  Do not crush.  May dissolve.  . pramipexole (MIRAPEX) 1.5 MG tablet Take 1.5 mg by mouth 2 (two) times daily.   . sertraline (ZOLOFT) 100 MG tablet Take 200 mg by mouth daily. Take two 100 mg tablets to = 200 mg po QD for depression  . shark liver oil-cocoa butter (PREPARATION H) 0.25-3-85.5 % suppository Place 1 suppository rectally 2 (two) times daily as needed for hemorrhoids.  . sodium chloride 0.9 % infusion Inject 1,000 mLs into the vein continuous.  . topiramate (TOPAMAX) 50 MG tablet Take 50 mg by mouth 2 (two) times daily.   Marland Kitchen torsemide (DEMADEX) 20 MG tablet Take 20 mg by mouth daily.   . Warfarin Sodium (COUMADIN PO) Take 5 mg by mouth daily.   . Zinc Oxide (SECURA EXTRA PROTECTIVE EX) Apply topically as needed. Apply to sacrum and buttocks   No facility-administered encounter medications on file as of 04/16/2017.  Allergies (verified) Remeron [mirtazapine]   History: Past Medical History:  Diagnosis Date  . Allergic rhinitis   . Anemia   . Anxiety   . Arthritis   . Bruising   . Carpal tunnel syndrome   . CHF (congestive heart failure) (HCC)   . Chronic diarrhea   . Chronic pulmonary embolism (HCC)   . Demand ischemia of myocardium (HCC)   . Flatulence   . Generalized anxiety disorder   . GERD without esophagitis   . Hemorrhoids   . Hyperlipidemia   . Hypertension   . Hyperthyroidism   . Hypokalemia   . Long term current use of anticoagulant therapy   . Major depressive disorder   . Metabolic encephalopathy   . Overactive bladder   . Protein calorie malnutrition (HCC)   . Psychosis   . Renal disorder   . RLS (restless legs syndrome)     . Thrombocytopathia (HCC)   . Thrombocytopenia (HCC)   . Vaginal atrophy    Past Surgical History:  Procedure Laterality Date  . ABDOMINAL HYSTERECTOMY    . APPENDECTOMY    . BACK SURGERY    . GASTRIC BYPASS    . SHOULDER SURGERY    . SPINE SURGERY     spinal fusion  . TONSILLECTOMY     Family History  Problem Relation Age of Onset  . Thyroid disease Neg Hx    Social History   Occupational History  . Not on file.   Social History Main Topics  . Smoking status: Never Smoker  . Smokeless tobacco: Never Used  . Alcohol use No  . Drug use: No  . Sexual activity: Not on file    Tobacco Counseling Counseling given: Not Answered   Activities of Daily Living In your present state of health, do you have any difficulty performing the following activities: 04/16/2017  Hearing? N  Vision? N  Difficulty concentrating or making decisions? Y  Walking or climbing stairs? Y  Dressing or bathing? Y  Doing errands, shopping? Y  Using the Toilet? Y  In the past six months, have you accidently leaked urine? N  Do you have problems with loss of bowel control? N  Managing your Medications? Y  Managing your Finances? Y  Housekeeping or managing your Housekeeping? Y  Some recent data might be hidden    Immunizations and Health Maintenance There is no immunization history for the selected administration types on file for this patient. There are no preventive care reminders to display for this patient.  Patient Care Team: Oneal Grout, MD as PCP - General (Internal Medicine) Medina-Vargas, Margit Banda, NP as Nurse Practitioner (Internal Medicine)  Indicate any recent Medical Services you may have received from other than [facility] providers in the past year (date may be approximate).     Assessment:   This is a routine wellness examination for Katie Evans.   Hearing/Vision screen No exam data present  Dietary issues and exercise activities discussed: Current Exercise Habits: The  patient does not participate in regular exercise at present, Exercise limited by: None identified  Goals    . Maintain Lifestyle          Starting today pt will maintain lifestyle.       Depression Screen PHQ 2/9 Scores 04/16/2017  PHQ - 2 Score 2  PHQ- 9 Score 11    Fall Risk Fall Risk  04/16/2017  Falls in the past year? No    Cognitive Function:     6CIT Screen  04/16/2017  What Year? 0 points  What month? 3 points  What time? 3 points  Count back from 20 4 points  Months in reverse 4 points  Repeat phrase 10 points  Total Score 24    Screening Tests Health Maintenance  Topic Date Due  . PNA vac Low Risk Adult (1 of 2 - PCV13) 08/20/2017 (Originally 09/22/2013)  . INFLUENZA VACCINE  11/19/2017 (Originally 06/26/2017)  . DEXA SCAN  11/26/2018 (Originally 09/22/2013)  . TETANUS/TDAP  11/27/2023 (Originally 09/[birth year]/1967)  . MAMMOGRAM  04/16/2018  . COLONOSCOPY  05/01/2022  . Hepatitis C Screening  Completed      Plan:    I have personally reviewed and addressed the Medicare Annual Wellness questionnaire and have noted the following in the patient's chart:  A. Medical and social history B. Use of alcohol, tobacco or illicit drugs  C. Current medications and supplements D. Functional ability and status E.  Nutritional status F.  Physical activity G. Advance directives H. List of other physicians I.  Hospitalizations, surgeries, and ER visits in previous 12 months J.  Vitals K. Screenings to include hearing, vision, cognitive, depression L. Referrals and appointments - none  In addition, I have reviewed and discussed with patient certain preventive protocols, quality metrics, and best practice recommendations. A written personalized care plan for preventive services as well as general preventive health recommendations were provided to patient.  See attached scanned questionnaire for additional information.   Signed,   Annetta MawSara Gonthier, RN Nurse Health  Advisor

## 2017-04-16 NOTE — Patient Instructions (Signed)
Katie Evans , Thank you for taking time to come for your Medicare Wellness Visit. I appreciate your ongoing commitment to your health goals. Please review the following plan we discussed and let me know if I can assist you in the future.   Screening recommendations/referrals: Colonoscopy excused, long term resident Mammogram excused, long term resident Bone Density due Recommended yearly ophthalmology/optometry visit for glaucoma screening and checkup Recommended yearly dental visit for hygiene and checkup  Vaccinations: Influenza vaccine due, ordered Pneumococcal vaccine due, ordered Tdap vaccine not in records Shingles vaccine not in records  Advanced directives: Need copy for chart  Conditions/risks identified: none  Next appointment: none upcoming   Preventive Care 69 Years and Older, Female Preventive care refers to lifestyle choices and visits with your health care provider that can promote health and wellness. What does preventive care include?  A yearly physical exam. This is also called an annual well check.  Dental exams once or twice a year.  Routine eye exams. Ask your health care provider how often you should have your eyes checked.  Personal lifestyle choices, including:  Daily care of your teeth and gums.  Regular physical activity.  Eating a healthy diet.  Avoiding tobacco and drug use.  Limiting alcohol use.  Practicing safe sex.  Taking low-dose aspirin every day.  Taking vitamin and mineral supplements as recommended by your health care provider. What happens during an annual well check? The services and screenings done by your health care provider during your annual well check will depend on your age, overall health, lifestyle risk factors, and family history of disease. Counseling  Your health care provider may ask you questions about your:  Alcohol use.  Tobacco use.  Drug use.  Emotional well-being.  Home and relationship  well-being.  Sexual activity.  Eating habits.  History of falls.  Memory and ability to understand (cognition).  Work and work Astronomerenvironment.  Reproductive health. Screening  You may have the following tests or measurements:  Height, weight, and BMI.  Blood pressure.  Lipid and cholesterol levels. These may be checked every 5 years, or more frequently if you are over 69 years old.  Skin check.  Lung cancer screening. You may have this screening every year starting at age 69 if you have a 30-pack-year history of smoking and currently smoke or have quit within the past 15 years.  Fecal occult blood test (FOBT) of the stool. You may have this test every year starting at age 69.  Flexible sigmoidoscopy or colonoscopy. You may have a sigmoidoscopy every 5 years or a colonoscopy every 10 years starting at age 69.  Hepatitis C blood test.  Hepatitis B blood test.  Sexually transmitted disease (STD) testing.  Diabetes screening. This is done by checking your blood sugar (glucose) after you have not eaten for a while (fasting). You may have this done every 1-3 years.  Bone density scan. This is done to screen for osteoporosis. You may have this done starting at age 69.  Mammogram. This may be done every 1-2 years. Talk to your health care provider about how often you should have regular mammograms. Talk with your health care provider about your test results, treatment options, and if necessary, the need for more tests. Vaccines  Your health care provider may recommend certain vaccines, such as:  Influenza vaccine. This is recommended every year.  Tetanus, diphtheria, and acellular pertussis (Tdap, Td) vaccine. You may need a Td booster every 10 years.  Zoster vaccine.  You may need this after age 41.  Pneumococcal 13-valent conjugate (PCV13) vaccine. One dose is recommended after age 28.  Pneumococcal polysaccharide (PPSV23) vaccine. One dose is recommended after age  60. Talk to your health care provider about which screenings and vaccines you need and how often you need them. This information is not intended to replace advice given to you by your health care provider. Make sure you discuss any questions you have with your health care provider. Document Released: 12/09/2015 Document Revised: 08/01/2016 Document Reviewed: 09/13/2015 Elsevier Interactive Patient Education  2017 [place] Prevention in the Home Falls can cause injuries. They can happen to people of all ages. There are many things you can do to make your home safe and to help prevent falls. What can I do on the outside of my home?  Regularly fix the edges of walkways and driveways and fix any cracks.  Remove anything that might make you trip as you walk through a door, such as a raised step or threshold.  Trim any bushes or trees on the path to your home.  Use bright outdoor lighting.  Clear any walking paths of anything that might make someone trip, such as rocks or tools.  Regularly check to see if handrails are loose or broken. Make sure that both sides of any steps have handrails.  Any raised decks and porches should have guardrails on the edges.  Have any leaves, snow, or ice cleared regularly.  Use sand or salt on walking paths during winter.  Clean up any spills in your garage right away. This includes oil or grease spills. What can I do in the bathroom?  Use night lights.  Install grab bars by the toilet and in the tub and shower. Do not use towel bars as grab bars.  Use non-skid mats or decals in the tub or shower.  If you need to sit down in the shower, use a plastic, non-slip stool.  Keep the floor dry. Clean up any water that spills on the floor as soon as it happens.  Remove soap buildup in the tub or shower regularly.  Attach bath mats securely with double-sided non-slip rug tape.  Do not have throw rugs and other things on the floor that can make  you trip. What can I do in the bedroom?  Use night lights.  Make sure that you have a light by your bed that is easy to reach.  Do not use any sheets or blankets that are too big for your bed. They should not hang down onto the floor.  Have a firm chair that has side arms. You can use this for support while you get dressed.  Do not have throw rugs and other things on the floor that can make you trip. What can I do in the kitchen?  Clean up any spills right away.  Avoid walking on wet floors.  Keep items that you use a lot in easy-to-reach places.  If you need to reach something above you, use a strong step stool that has a grab bar.  Keep electrical cords out of the way.  Do not use floor polish or wax that makes floors slippery. If you must use wax, use non-skid floor wax.  Do not have throw rugs and other things on the floor that can make you trip. What can I do with my stairs?  Do not leave any items on the stairs.  Make sure that there are handrails on  both sides of the stairs and use them. Fix handrails that are broken or loose. Make sure that handrails are as long as the stairways.  Check any carpeting to make sure that it is firmly attached to the stairs. Fix any carpet that is loose or worn.  Avoid having throw rugs at the top or bottom of the stairs. If you do have throw rugs, attach them to the floor with carpet tape.  Make sure that you have a light switch at the top of the stairs and the bottom of the stairs. If you do not have them, ask someone to add them for you. What else can I do to help prevent falls?  Wear shoes that:  Do not have high heels.  Have rubber bottoms.  Are comfortable and fit you well.  Are closed at the toe. Do not wear sandals.  If you use a stepladder:  Make sure that it is fully opened. Do not climb a closed stepladder.  Make sure that both sides of the stepladder are locked into place.  Ask someone to hold it for you, if  possible.  Clearly mark and make sure that you can see:  Any grab bars or handrails.  First and last steps.  Where the edge of each step is.  Use tools that help you move around (mobility aids) if they are needed. These include:  Canes.  Walkers.  Scooters.  Crutches.  Turn on the lights when you go into a dark area. Replace any light bulbs as soon as they burn out.  Set up your furniture so you have a clear path. Avoid moving your furniture around.  If any of your floors are uneven, fix them.  If there are any pets around you, be aware of where they are.  Review your medicines with your doctor. Some medicines can make you feel dizzy. This can increase your chance of falling. Ask your doctor what other things that you can do to help prevent falls. This information is not intended to replace advice given to you by your health care provider. Make sure you discuss any questions you have with your health care provider. Document Released: 09/08/2009 Document Revised: 04/19/2016 Document Reviewed: 12/17/2014 Elsevier Interactive Patient Education  2017 [employer].

## 2017-04-16 NOTE — Progress Notes (Signed)
Quick Notes   Health Maintenance: DEXA due, order if needed. PNA 13 and flu ordered.     Abnormal Screen: PHQ-11 6 CIT-24     Patient Concerns: Swollen R hand, I informed the nurse.     Nurse Concerns: None

## 2017-04-17 ENCOUNTER — Encounter: Payer: Self-pay | Admitting: Internal Medicine

## 2017-04-17 LAB — BASIC METABOLIC PANEL
BUN: 28 mg/dL — AB (ref 4–21)
Creatinine: 1.3 mg/dL — AB (ref 0.5–1.1)
Glucose: 98 mg/dL
Potassium: 4.1 mmol/L (ref 3.4–5.3)
Sodium: 140 mmol/L (ref 137–147)

## 2017-04-17 NOTE — Progress Notes (Signed)
This encounter was created in error - please disregard.

## 2017-04-23 ENCOUNTER — Encounter: Payer: Self-pay | Admitting: Adult Health

## 2017-04-23 ENCOUNTER — Non-Acute Institutional Stay (SKILLED_NURSING_FACILITY): Payer: Medicare Other | Admitting: Adult Health

## 2017-04-23 DIAGNOSIS — I2782 Chronic pulmonary embolism: Secondary | ICD-10-CM

## 2017-04-23 DIAGNOSIS — Z7901 Long term (current) use of anticoagulants: Secondary | ICD-10-CM

## 2017-04-23 NOTE — Progress Notes (Signed)
Location:   [facility] Room Number: 1005-B Place of Service:  SNF (31)   CODE STATUS: full code   Allergies  Allergen Reactions  . Remeron [Mirtazapine]     Chief Complaint  Patient presents with  . Acute Visit    INR    HPI:  She is on long term coumadin therapy for chronic pulmonary embolism. There are no reports of missed doses. There are no issues with tolerating the coumadin   Past Medical History:  Diagnosis Date  . Allergic rhinitis   . Anemia   . Anxiety   . Arthritis   . Bruising   . Carpal tunnel syndrome   . CHF (congestive heart failure) (HCC)   . Chronic diarrhea   . Chronic pulmonary embolism (HCC)   . Demand ischemia of myocardium (HCC)   . Flatulence   . Generalized anxiety disorder   . GERD without esophagitis   . Hemorrhoids   . Hyperlipidemia   . Hypertension   . Hyperthyroidism   . Hypokalemia   . Long term current use of anticoagulant therapy   . Major depressive disorder   . Metabolic encephalopathy   . Overactive bladder   . Protein calorie malnutrition (HCC)   . Psychosis   . Renal disorder   . RLS (restless legs syndrome)   . Thrombocytopathia (HCC)   . Thrombocytopenia (HCC)   . Vaginal atrophy     Past Surgical History:  Procedure Laterality Date  . ABDOMINAL HYSTERECTOMY    . APPENDECTOMY    . BACK SURGERY    . GASTRIC BYPASS    . SHOULDER SURGERY    . SPINE SURGERY     spinal fusion  . TONSILLECTOMY      Social History   Social History  . Marital status: Single    Spouse name: N/A  . Number of children: N/A  . Years of education: N/A   Occupational History  . Not on file.   Social History Main Topics  . Smoking status: Never Smoker  . Smokeless tobacco: Never Used  . Alcohol use No  . Drug use: No  . Sexual activity: Not on file   Other Topics Concern  . Not on file   Social History Narrative  . No narrative on file   Family History  Problem Relation Age of Onset  . Thyroid  disease Neg Hx       VITAL SIGNS BP 128/61   Pulse 75   Temp 98.6 F (37 C) (Oral)   Resp 16   Ht 5\' 2"  (1.575 m)   Wt 187 lb (84.8 kg)   SpO2 95%   BMI 34.20 kg/m   Patient's Medications  New Prescriptions   No medications on file  Previous Medications   ASPIRIN 81 MG CHEWABLE TABLET    Chew 81 mg by mouth daily.    ATORVASTATIN (LIPITOR) 20 MG TABLET    Take 20 mg by mouth daily.   CHOLECALCIFEROL (D3-1000 PO)    Take 1,000 Units by mouth daily.    CLONAZEPAM (KLONOPIN) 0.5 MG TABLET    Take 0.5 mg by mouth at bedtime.   CYCLOBENZAPRINE (FLEXERIL) 5 MG TABLET    Take 5 mg by mouth 3 (three) times daily as needed for muscle spasms.   FAMOTIDINE (PEPCID) 20 MG TABLET    Take 20 mg by mouth daily.    HYDROCODONE-ACETAMINOPHEN (NORCO) 10-325 MG TABLET    Take 1 tablet by mouth every  4 (four) hours as needed.   LIDOCAINE (LIDODERM) 5 %    Place 1 patch onto the skin daily. Remove & Discard patch within 12 hours or as directed by MD. Apply to posterior neck.   LOPERAMIDE (IMODIUM) 2 MG CAPSULE    Take 2 mg by mouth 4 (four) times daily as needed for diarrhea or loose stools.    MENTHOL, TOPICAL ANALGESIC, (ICY HOT EX)    Apply 1 application topically. Apply 7.5% cream to both shoulders at 9AM, 3PM, 9PM.  Hold for skin irritation.   METHIMAZOLE (TAPAZOLE) 10 MG TABLET    Take 1 tablet (10 mg total) by mouth daily.   NADOLOL (CORGARD) 40 MG TABLET    Take 40 mg by mouth daily.    NITROGLYCERIN (NITROSTAT) 0.4 MG SL TABLET    Place 0.4 mg under the tongue every 5 (five) minutes as needed for chest pain. For 3 doses as needed for chest pain   OXYBUTYNIN (DITROPAN) 5 MG TABLET    Take 5 mg by mouth daily.    POTASSIUM CHLORIDE SA (K-DUR,KLOR-CON) 20 MEQ TABLET    Take 20 mEq by mouth 2 (two) times daily. Take with food.  Do not crush.  May dissolve.   PRAMIPEXOLE (MIRAPEX) 1.5 MG TABLET    Take 1.5 mg by mouth 2 (two) times daily.    SACCHAROMYCES BOULARDII (FLORASTOR) 250 MG CAPSULE     Take 250 mg by mouth 2 (two) times daily. Stop date 04/28/17   SERTRALINE (ZOLOFT) 100 MG TABLET    Take 200 mg by mouth daily. Take two 100 mg tablets to = 200 mg po QD for depression   SHARK LIVER OIL-COCOA BUTTER (PREPARATION H) 0.25-3-85.5 % SUPPOSITORY    Place 1 suppository rectally 2 (two) times daily as needed for hemorrhoids.   SULFAMETHOXAZOLE-TRIMETHOPRIM (BACTRIM DS,SEPTRA DS) 800-160 MG TABLET    Take 1 tablet by mouth daily. Stop date 04/24/17   TOPIRAMATE (TOPAMAX) 50 MG TABLET    Take 50 mg by mouth 2 (two) times daily.    TORSEMIDE (DEMADEX) 20 MG TABLET    Take 20 mg by mouth daily.    WARFARIN (COUMADIN) 3 MG TABLET    Take 1.5 mg by mouth daily.   ZINC OXIDE (SECURA EXTRA PROTECTIVE EX)    Apply topically as needed. Apply to sacrum and buttocks  Modified Medications   No medications on file  Discontinued Medications   HYDROCODONE-ACETAMINOPHEN (NORCO/VICODIN) 5-325 MG TABLET    Take one to two tablets by mouth every 4 hours as needed for pain. Do not exceed 4gm of Tylenol in 24 hours   HYPROMELLOSE (ARTIFICIAL TEARS OP)    Apply to eye 3 (three) times daily as needed. Both eyes   LORATADINE (CLARITIN) 10 MG TABLET    Take 10 mg by mouth daily as needed.    POLYCARBOPHIL (FIBERCON) 625 MG TABLET    Take 625 mg by mouth daily as needed (chronic diarrhea).    SODIUM CHLORIDE 0.9 % INFUSION    Inject 1,000 mLs into the vein continuous.   WARFARIN SODIUM (COUMADIN PO)    Take 5 mg by mouth daily.      SIGNIFICANT DIAGNOSTIC EXAMS   LABS REVIEWED:   04-23-17: INR 4.3  Review of Systems  Constitutional: Negative for malaise/fatigue.  Respiratory: Negative for cough and shortness of breath.   Cardiovascular: Negative for chest pain, palpitations and leg swelling.  Gastrointestinal: Negative for abdominal pain, constipation and heartburn.  Musculoskeletal: Negative  for back pain, joint pain and myalgias.  Skin: Negative.   Neurological: Negative for dizziness.    Psychiatric/Behavioral: The patient is not nervous/anxious.     Physical Exam  Constitutional: No distress.  Eyes: Conjunctivae are normal.  Neck: Neck supple. No JVD present. No thyromegaly present.  Cardiovascular: Normal rate, regular rhythm and intact distal pulses.   Respiratory: Effort normal and breath sounds normal. No respiratory distress. She has no wheezes.  GI: Soft. Bowel sounds are normal. She exhibits no distension. There is no tenderness.  Musculoskeletal: She exhibits no edema.  Able to move all extremities   Lymphadenopathy:    She has no cervical adenopathy.  Neurological: She is alert.  Skin: Skin is warm and dry. She is not diaphoretic.  Psychiatric: She has a normal mood and affect.     ASSESSMENT/ PLAN:  1. Chronic pulmonary embolism 2. Anticoagulation management  For INR 4.3 will hold coumadin for 2 days and will check INR   MD is aware of resident's narcotic use and is in agreement with current plan of care. We will attempt to wean resident as apropriate   Synthia Innocent NP [facility]  Contact [phone] Monday through Friday 8am- 5pm  After hours call [phone]

## 2017-04-25 ENCOUNTER — Telehealth: Payer: Self-pay | Admitting: Endocrinology

## 2017-04-25 ENCOUNTER — Encounter: Payer: Self-pay | Admitting: Adult Health

## 2017-04-25 ENCOUNTER — Non-Acute Institutional Stay (SKILLED_NURSING_FACILITY): Payer: Medicare Other | Admitting: Adult Health

## 2017-04-25 DIAGNOSIS — J9601 Acute respiratory failure with hypoxia: Secondary | ICD-10-CM

## 2017-04-25 DIAGNOSIS — Z86711 Personal history of pulmonary embolism: Secondary | ICD-10-CM | POA: Diagnosis not present

## 2017-04-25 DIAGNOSIS — Z7901 Long term (current) use of anticoagulants: Secondary | ICD-10-CM

## 2017-04-25 NOTE — Telephone Encounter (Signed)
Called to speak with Misty StanleyLisa. Asked for return call.

## 2017-04-25 NOTE — Progress Notes (Signed)
DATE:  04/25/2017   MRN:  [id number]  BIRTHDAY: Mar 15, 1946  Facility:  Nursing Home Location:  [facility]  Nursing Home Room Number: 1005-B  LEVEL OF CARE:  [phone])  Contact Information    Name Relation Home Work [place] Sister [phone]   [place] Plan   [phone]       Code Status History    This patient does not have a recorded code status. Please follow your organizational policy for patients in this situation.       Chief Complaint  Patient presents with  . Acute Visit    Decreased oxygen saturation    HISTORY OF PRESENT ILLNESS:  This is a 21-YO female seen for an acute visit due to decreased oxygen saturation on room air, 66% . She was started on O2 @ 2L/min. She was seen in the room and noted her fingers are cold. She was not having SOB. Re-checked O2 saturation with 2L/min O2 was 98%.  PAST MEDICAL HISTORY:  Past Medical History:  Diagnosis Date  . Allergic rhinitis   . Anemia   . Anxiety   . Arthritis   . Bruising   . Carpal tunnel syndrome   . CHF (congestive heart failure) (HCC)   . Chronic diarrhea   . Chronic pulmonary embolism (HCC)   . Demand ischemia of myocardium (HCC)   . Flatulence   . Generalized anxiety disorder   . GERD without esophagitis   . Hemorrhoids   . Hyperlipidemia   . Hypertension   . Hyperthyroidism   . Hypokalemia   . Long term current use of anticoagulant therapy   . Major depressive disorder   . Metabolic encephalopathy   . Overactive bladder   . Protein calorie malnutrition (HCC)   . Psychosis   . Renal disorder   . RLS (restless legs syndrome)   . Thrombocytopathia (HCC)   . Thrombocytopenia (HCC)   . Vaginal atrophy      CURRENT MEDICATIONS: Reviewed  Patient's Medications  New Prescriptions   No medications on file  Previous Medications   ASPIRIN 81 MG CHEWABLE TABLET    Chew 81 mg by mouth daily.    ATORVASTATIN (LIPITOR) 20 MG  TABLET    Take 20 mg by mouth daily.   CHOLECALCIFEROL (D3-1000 PO)    Take 1,000 Units by mouth daily.    CLONAZEPAM (KLONOPIN) 0.5 MG TABLET    Take 0.5 mg by mouth at bedtime.   CYCLOBENZAPRINE (FLEXERIL) 5 MG TABLET    Take 5 mg by mouth 3 (three) times daily as needed for muscle spasms.   FAMOTIDINE (PEPCID) 20 MG TABLET    Take 20 mg by mouth daily.    HYDROCODONE-ACETAMINOPHEN (NORCO/VICODIN) 5-325 MG TABLET    Take 1-2 tablets by mouth every 4 (four) hours as needed for moderate pain.   LIDOCAINE (LIDODERM) 5 %    Place 1 patch onto the skin daily. Remove & Discard patch within 12 hours or as directed by MD. Apply to posterior neck.   LOPERAMIDE (IMODIUM) 2 MG CAPSULE    Take 2 mg by mouth 4 (four) times daily as needed for diarrhea or loose stools.    MENTHOL, TOPICAL ANALGESIC, (ICY HOT EX)    Apply 1 application topically. Apply 7.5% cream to both shoulders at 9AM, 3PM, 9PM.  Hold for skin irritation.   METHIMAZOLE (TAPAZOLE) 10 MG TABLET    Take 1 tablet (10 mg total)  by mouth daily.   NADOLOL (CORGARD) 40 MG TABLET    Take 40 mg by mouth daily.    NITROGLYCERIN (NITROSTAT) 0.4 MG SL TABLET    Place 0.4 mg under the tongue every 5 (five) minutes as needed for chest pain. For 3 doses as needed for chest pain   OXYBUTYNIN (DITROPAN) 5 MG TABLET    Take 5 mg by mouth daily.    POTASSIUM CHLORIDE SA (K-DUR,KLOR-CON) 20 MEQ TABLET    Take 20 mEq by mouth 2 (two) times daily. Take with food.  Do not crush.  May dissolve.   PRAMIPEXOLE (MIRAPEX) 1.5 MG TABLET    Take 1.5 mg by mouth 2 (two) times daily.    SACCHAROMYCES BOULARDII (FLORASTOR) 250 MG CAPSULE    Take 250 mg by mouth 2 (two) times daily. Stop date 04/28/17   SERTRALINE (ZOLOFT) 100 MG TABLET    Take 200 mg by mouth daily. Take two 100 mg tablets to = 200 mg po QD for depression   SHARK LIVER OIL-COCOA BUTTER (PREPARATION H) 0.25-3-85.5 % SUPPOSITORY    Place 1 suppository rectally 2 (two) times daily as needed for hemorrhoids.    TOPIRAMATE (TOPAMAX) 50 MG TABLET    Take 50 mg by mouth 2 (two) times daily.    TORSEMIDE (DEMADEX) 20 MG TABLET    Take 20 mg by mouth daily.    ZINC OXIDE (SECURA EXTRA PROTECTIVE EX)    Apply topically as needed. Apply to sacrum and buttocks  Modified Medications   No medications on file  Discontinued Medications   HYDROCODONE-ACETAMINOPHEN (NORCO) 10-325 MG TABLET    Take 1 tablet by mouth every 4 (four) hours as needed.   SULFAMETHOXAZOLE-TRIMETHOPRIM (BACTRIM DS,SEPTRA DS) 800-160 MG TABLET    Take 1 tablet by mouth daily. Stop date 04/24/17   WARFARIN (COUMADIN) 3 MG TABLET    Take 1.5 mg by mouth daily.     Allergies  Allergen Reactions  . Remeron [Mirtazapine]      REVIEW OF SYSTEMS:  GENERAL: no change in appetite, no fatigue, no weight changes, no fever, chills or weakness EYES: Denies change in vision, dry eyes, eye pain, itching or discharge EARS: Denies change in hearing, ringing in ears, or earache NOSE: Denies nasal congestion or epistaxis MOUTH and THROAT: Denies oral discomfort, gingival pain or bleeding, pain from teeth or hoarseness   RESPIRATORY: no cough, SOB, DOE, wheezing, hemoptysis CARDIAC: no chest pain, edema or palpitations GI: no abdominal pain, diarrhea, constipation, heart burn, nausea or vomiting GU: Denies dysuria, frequency, hematuria, incontinence, or discharge PSYCHIATRIC: Denies feeling of depression or anxiety. No report of hallucinations, insomnia, paranoia, or agitation    PHYSICAL EXAMINATION  GENERAL APPEARANCE: Well nourished. In no acute distress. Obese SKIN:  Fingertips are cold, skin is intact.   HEAD: Normal in size and contour. No evidence of trauma EYES: Lids open and close normally. No blepharitis, entropion or ectropion. PERRL. Conjunctivae are clear and sclerae are white. Lenses are without opacity EARS: Pinnae are normal. Patient hears normal voice tunes of the examiner MOUTH and THROAT: Lips are without lesions. Oral  mucosa is moist and without lesions. Tongue is normal in shape, size, and color and without lesions NECK: supple, trachea midline, no neck masses, no thyroid tenderness, no thyromegaly LYMPHATICS: no LAN in the neck, no supraclavicular LAN RESPIRATORY: breathing is even & unlabored, BS CTAB CARDIAC: RRR, no murmur,no extra heart sounds, no edema GI: abdomen soft, normal BS, no masses, no tenderness,  no hepatomegaly, no splenomegaly EXTREMITIES:  Able to move X 4 extremities PSYCHIATRIC: Alert to person, disoriented to time and place. Affect and behavior are appropriate   LABS/RADIOLOGY: Labs reviewed: Basic Metabolic Panel:  Recent Labs  40/98/11 04/11/17 04/17/17  NA 136* 138 140  K 3.8 3.8 4.1  BUN 61* 53* 28*  CREATININE 1.9* 1.7* 1.3*   Liver Function Tests:  Recent Labs  06/13/16 09/03/16 03/26/17  AST 15 26 18   ALT 10 19 14   ALKPHOS 187* 180* 173*   CBC:  Recent Labs  12/12/16 01/22/17 03/26/17 04/10/17  WBC 8.5 5.4 6.6 6.5  NEUTROABS 6 3  --  4,349  HGB 11.6* 9.9* 12.2 10.9*  HCT 38 31* 39 35*  PLT 154 107* 137* 100*    ASSESSMENT/PLAN:  Acute respiratory failure with hypoxia - continue O2 @ 2L/min via [place],will do chest x-ray to rule out PNA; check vital signs Q shift X 1 week; notify provider for any change  Long-term use of anticoagulant - INR 3.3, hold Coumadin and check INR on 04/26/17  History of pulmonary embolism - will hold Coumadin for now due to supratherapeutic INR      Katie Evans - NP    [facility] [phone]

## 2017-06-05 ENCOUNTER — Ambulatory Visit (INDEPENDENT_AMBULATORY_CARE_PROVIDER_SITE_OTHER): Payer: Medicare Other | Admitting: Endocrinology

## 2017-06-05 DIAGNOSIS — E0590 Thyrotoxicosis, unspecified without thyrotoxic crisis or storm: Secondary | ICD-10-CM

## 2017-06-05 LAB — TSH: TSH: 0.05 u[IU]/mL — ABNORMAL LOW (ref 0.35–4.50)

## 2017-06-05 LAB — T4, FREE: FREE T4: 0.52 ng/dL — AB (ref 0.60–1.60)

## 2017-06-05 MED ORDER — METHIMAZOLE 5 MG PO TABS: 5.0000 mg | ORAL_TABLET | Freq: Three times a day (TID) | ORAL | 5 refills | Status: DC

## 2017-06-05 NOTE — Patient Instructions (Addendum)
blood tests are requested for you today.  We'll let you know about the results. If ever you have fever while taking methimazole, stop it and call us, even if the reason is obvious, because of the risk of a rare side-effect.  Please come back for a follow-up appointment in 3 months.   

## 2017-06-06 MED ORDER — METHIMAZOLE 5 MG PO TABS: 5.0000 mg | ORAL_TABLET | Freq: Every day | ORAL | 5 refills | Status: AC

## 2017-06-06 NOTE — Progress Notes (Signed)
Subjective:    Patient ID: Katie Roseamela Evans, female    DOB: 20-Jun-1946, 71 y.o.   MRN: [id number]  HPI Pt returns for f/u of hyperthyroidism (dx'ed 2016; she has been on tapazole since dx; in March of 2018, TSH was very high; when pt was first seen here in April of 2018, tapazole was not on med list, so it is presumed to have been d/c'ed then; she has never had thyroid imaging).  pt states she feels well in general.   Past Medical History:  Diagnosis Date  . Allergic rhinitis   . Anemia   . Anxiety   . Arthritis   . Bruising   . Carpal tunnel syndrome   . CHF (congestive heart failure) (HCC)   . Chronic diarrhea   . Chronic pulmonary embolism (HCC)   . Demand ischemia of myocardium (HCC)   . Flatulence   . Generalized anxiety disorder   . GERD without esophagitis   . Hemorrhoids   . Hyperlipidemia   . Hypertension   . Hyperthyroidism   . Hypokalemia   . Long term current use of anticoagulant therapy   . Major depressive disorder   . Metabolic encephalopathy   . Overactive bladder   . Protein calorie malnutrition (HCC)   . Psychosis   . Renal disorder   . RLS (restless legs syndrome)   . Thrombocytopathia (HCC)   . Thrombocytopenia (HCC)   . Vaginal atrophy     Past Surgical History:  Procedure Laterality Date  . ABDOMINAL HYSTERECTOMY    . APPENDECTOMY    . BACK SURGERY    . GASTRIC BYPASS    . SHOULDER SURGERY    . SPINE SURGERY     spinal fusion  . TONSILLECTOMY      Social History   Social History  . Marital status: Single    Spouse name: N/A  . Number of children: N/A  . Years of education: N/A   Occupational History  . Not on file.   Social History Main Topics  . Smoking status: Never Smoker  . Smokeless tobacco: Never Used  . Alcohol use No  . Drug use: No  . Sexual activity: Not on file   Other Topics Concern  . Not on file   Social History Narrative  . No narrative on file    Current Outpatient Prescriptions on File Prior to Visit    Medication Sig Dispense Refill  . aspirin 81 MG chewable tablet Chew 81 mg by mouth daily.     Marland Kitchen. atorvastatin (LIPITOR) 20 MG tablet Take 20 mg by mouth daily.    . Cholecalciferol (D3-1000 PO) Take 1,000 Units by mouth daily.     . clonazePAM (KLONOPIN) 0.5 MG tablet Take 0.5 mg by mouth at bedtime.    . cyclobenzaprine (FLEXERIL) 5 MG tablet Take 5 mg by mouth 3 (three) times daily as needed for muscle spasms.    . famotidine (PEPCID) 20 MG tablet Take 20 mg by mouth daily.     Marland Kitchen. HYDROcodone-acetaminophen (NORCO/VICODIN) 5-325 MG tablet Take 1-2 tablets by mouth every 4 (four) hours as needed for moderate pain.    Marland Kitchen. lidocaine (LIDODERM) 5 % Place 1 patch onto the skin daily. Remove & Discard patch within 12 hours or as directed by MD. Apply to posterior neck.    Marland Kitchen. loperamide (IMODIUM) 2 MG capsule Take 2 mg by mouth 4 (four) times daily as needed for diarrhea or loose stools.     . Menthol,  Topical Analgesic, (ICY HOT EX) Apply 1 application topically. Apply 7.5% cream to both shoulders at 9AM, 3PM, 9PM.  Hold for skin irritation.    . nadolol (CORGARD) 40 MG tablet Take 40 mg by mouth daily.     . nitroGLYCERIN (NITROSTAT) 0.4 MG SL tablet Place 0.4 mg under the tongue every 5 (five) minutes as needed for chest pain. For 3 doses as needed for chest pain    . oxybutynin (DITROPAN) 5 MG tablet Take 5 mg by mouth daily.     . potassium chloride SA (K-DUR,KLOR-CON) 20 MEQ tablet Take 20 mEq by mouth 2 (two) times daily. Take with food.  Do not crush.  May dissolve.    . pramipexole (MIRAPEX) 1.5 MG tablet Take 1.5 mg by mouth 2 (two) times daily.     Marland Kitchen saccharomyces boulardii (FLORASTOR) 250 MG capsule Take 250 mg by mouth 2 (two) times daily. Stop date 04/28/17    . sertraline (ZOLOFT) 100 MG tablet Take 200 mg by mouth daily. Take two 100 mg tablets to = 200 mg po QD for depression    . shark liver oil-cocoa butter (PREPARATION H) 0.25-3-85.5 % suppository Place 1 suppository rectally 2 (two)  times daily as needed for hemorrhoids.    . topiramate (TOPAMAX) 50 MG tablet Take 50 mg by mouth 2 (two) times daily.     Marland Kitchen torsemide (DEMADEX) 20 MG tablet Take 20 mg by mouth daily.     . Zinc Oxide (SECURA EXTRA PROTECTIVE EX) Apply topically as needed. Apply to sacrum and buttocks     No current facility-administered medications on file prior to visit.     Allergies  Allergen Reactions  . Remeron [Mirtazapine]     Family History  Problem Relation Age of Onset  . Thyroid disease Neg Hx     There were no vitals taken for this visit.  Review of Systems Denies fever    Objective:   Physical Exam VITAL SIGNS:  See vs page GENERAL: no distress NECK: There is no palpable thyroid enlargement.  No thyroid nodule is palpable.  No palpable lymphadenopathy at the anterior neck.   Lab Results  Component Value Date   TSH 0.05 (L) 06/05/2017      Assessment & Plan:  Hyperthyroidism, recurrent off rx.  I have sent a prescription to your pharmacy, to resume at 5 mg qd.

## 2017-07-24 ENCOUNTER — Emergency Department (HOSPITAL_COMMUNITY): Payer: Medicare Other

## 2017-07-24 ENCOUNTER — Inpatient Hospital Stay (HOSPITAL_COMMUNITY)
Admission: EM | Admit: 2017-07-24 | Discharge: 2017-08-05 | DRG: 871 | Disposition: A | Discharge status: Hospice | Payer: Medicare Other | Attending: Family Medicine | Admitting: Family Medicine

## 2017-07-24 ENCOUNTER — Encounter (HOSPITAL_COMMUNITY): Payer: Self-pay | Admitting: Emergency Medicine

## 2017-07-24 DIAGNOSIS — A4151 Sepsis due to Escherichia coli [E. coli]: Secondary | ICD-10-CM | POA: Diagnosis present

## 2017-07-24 DIAGNOSIS — R1312 Dysphagia, oropharyngeal phase: Secondary | ICD-10-CM | POA: Diagnosis present

## 2017-07-24 DIAGNOSIS — J9600 Acute respiratory failure, unspecified whether with hypoxia or hypercapnia: Secondary | ICD-10-CM

## 2017-07-24 DIAGNOSIS — Z66 Do not resuscitate: Secondary | ICD-10-CM | POA: Diagnosis present

## 2017-07-24 DIAGNOSIS — J440 Chronic obstructive pulmonary disease with acute lower respiratory infection: Secondary | ICD-10-CM | POA: Diagnosis present

## 2017-07-24 DIAGNOSIS — Z515 Encounter for palliative care: Secondary | ICD-10-CM | POA: Diagnosis not present

## 2017-07-24 DIAGNOSIS — I952 Hypotension due to drugs: Secondary | ICD-10-CM

## 2017-07-24 DIAGNOSIS — N183 Chronic kidney disease, stage 3 (moderate): Secondary | ICD-10-CM | POA: Diagnosis present

## 2017-07-24 DIAGNOSIS — L8990 Pressure ulcer of unspecified site, unspecified stage: Secondary | ICD-10-CM | POA: Diagnosis present

## 2017-07-24 DIAGNOSIS — N19 Unspecified kidney failure: Secondary | ICD-10-CM

## 2017-07-24 DIAGNOSIS — D638 Anemia in other chronic diseases classified elsewhere: Secondary | ICD-10-CM | POA: Diagnosis not present

## 2017-07-24 DIAGNOSIS — K5900 Constipation, unspecified: Secondary | ICD-10-CM

## 2017-07-24 DIAGNOSIS — E860 Dehydration: Secondary | ICD-10-CM | POA: Diagnosis present

## 2017-07-24 DIAGNOSIS — Z7189 Other specified counseling: Secondary | ICD-10-CM

## 2017-07-24 DIAGNOSIS — E43 Unspecified severe protein-calorie malnutrition: Secondary | ICD-10-CM | POA: Diagnosis present

## 2017-07-24 DIAGNOSIS — E785 Hyperlipidemia, unspecified: Secondary | ICD-10-CM | POA: Diagnosis present

## 2017-07-24 DIAGNOSIS — A419 Sepsis, unspecified organism: Secondary | ICD-10-CM | POA: Diagnosis present

## 2017-07-24 DIAGNOSIS — I5033 Acute on chronic diastolic (congestive) heart failure: Secondary | ICD-10-CM

## 2017-07-24 DIAGNOSIS — N179 Acute kidney failure, unspecified: Secondary | ICD-10-CM

## 2017-07-24 DIAGNOSIS — E875 Hyperkalemia: Secondary | ICD-10-CM

## 2017-07-24 DIAGNOSIS — E871 Hypo-osmolality and hyponatremia: Secondary | ICD-10-CM | POA: Diagnosis present

## 2017-07-24 DIAGNOSIS — I2782 Chronic pulmonary embolism: Secondary | ICD-10-CM

## 2017-07-24 DIAGNOSIS — E1122 Type 2 diabetes mellitus with diabetic chronic kidney disease: Secondary | ICD-10-CM | POA: Diagnosis present

## 2017-07-24 DIAGNOSIS — E0590 Thyrotoxicosis, unspecified without thyrotoxic crisis or storm: Secondary | ICD-10-CM | POA: Diagnosis present

## 2017-07-24 DIAGNOSIS — I959 Hypotension, unspecified: Secondary | ICD-10-CM | POA: Diagnosis present

## 2017-07-24 DIAGNOSIS — I361 Nonrheumatic tricuspid (valve) insufficiency: Secondary | ICD-10-CM | POA: Diagnosis not present

## 2017-07-24 DIAGNOSIS — I248 Other forms of acute ischemic heart disease: Secondary | ICD-10-CM | POA: Diagnosis present

## 2017-07-24 DIAGNOSIS — I5043 Acute on chronic combined systolic (congestive) and diastolic (congestive) heart failure: Secondary | ICD-10-CM | POA: Diagnosis present

## 2017-07-24 DIAGNOSIS — Z981 Arthrodesis status: Secondary | ICD-10-CM

## 2017-07-24 DIAGNOSIS — I10 Essential (primary) hypertension: Secondary | ICD-10-CM

## 2017-07-24 DIAGNOSIS — Z888 Allergy status to other drugs, medicaments and biological substances status: Secondary | ICD-10-CM

## 2017-07-24 DIAGNOSIS — G20 Parkinson's disease: Secondary | ICD-10-CM | POA: Diagnosis present

## 2017-07-24 DIAGNOSIS — I272 Pulmonary hypertension, unspecified: Secondary | ICD-10-CM

## 2017-07-24 DIAGNOSIS — R6521 Severe sepsis with septic shock: Secondary | ICD-10-CM | POA: Diagnosis present

## 2017-07-24 DIAGNOSIS — E878 Other disorders of electrolyte and fluid balance, not elsewhere classified: Secondary | ICD-10-CM | POA: Diagnosis present

## 2017-07-24 DIAGNOSIS — E440 Moderate protein-calorie malnutrition: Secondary | ICD-10-CM

## 2017-07-24 DIAGNOSIS — G9341 Metabolic encephalopathy: Secondary | ICD-10-CM

## 2017-07-24 DIAGNOSIS — J9690 Respiratory failure, unspecified, unspecified whether with hypoxia or hypercapnia: Secondary | ICD-10-CM

## 2017-07-24 DIAGNOSIS — F329 Major depressive disorder, single episode, unspecified: Secondary | ICD-10-CM | POA: Diagnosis present

## 2017-07-24 DIAGNOSIS — D6959 Other secondary thrombocytopenia: Secondary | ICD-10-CM | POA: Diagnosis present

## 2017-07-24 DIAGNOSIS — N3281 Overactive bladder: Secondary | ICD-10-CM | POA: Diagnosis present

## 2017-07-24 DIAGNOSIS — G2581 Restless legs syndrome: Secondary | ICD-10-CM | POA: Diagnosis present

## 2017-07-24 DIAGNOSIS — E46 Unspecified protein-calorie malnutrition: Secondary | ICD-10-CM | POA: Diagnosis present

## 2017-07-24 DIAGNOSIS — N390 Urinary tract infection, site not specified: Secondary | ICD-10-CM | POA: Diagnosis present

## 2017-07-24 DIAGNOSIS — B370 Candidal stomatitis: Secondary | ICD-10-CM | POA: Diagnosis present

## 2017-07-24 DIAGNOSIS — B9620 Unspecified Escherichia coli [E. coli] as the cause of diseases classified elsewhere: Secondary | ICD-10-CM | POA: Diagnosis present

## 2017-07-24 DIAGNOSIS — B954 Other streptococcus as the cause of diseases classified elsewhere: Secondary | ICD-10-CM | POA: Diagnosis present

## 2017-07-24 DIAGNOSIS — Z7901 Long term (current) use of anticoagulants: Secondary | ICD-10-CM

## 2017-07-24 DIAGNOSIS — Z9119 Patient's noncompliance with other medical treatment and regimen: Secondary | ICD-10-CM

## 2017-07-24 DIAGNOSIS — E870 Hyperosmolality and hypernatremia: Secondary | ICD-10-CM | POA: Diagnosis not present

## 2017-07-24 DIAGNOSIS — K219 Gastro-esophageal reflux disease without esophagitis: Secondary | ICD-10-CM | POA: Diagnosis present

## 2017-07-24 DIAGNOSIS — I130 Hypertensive heart and chronic kidney disease with heart failure and stage 1 through stage 4 chronic kidney disease, or unspecified chronic kidney disease: Secondary | ICD-10-CM | POA: Diagnosis present

## 2017-07-24 DIAGNOSIS — R0600 Dyspnea, unspecified: Secondary | ICD-10-CM

## 2017-07-24 DIAGNOSIS — R680 Hypothermia, not associated with low environmental temperature: Secondary | ICD-10-CM | POA: Diagnosis present

## 2017-07-24 DIAGNOSIS — F411 Generalized anxiety disorder: Secondary | ICD-10-CM | POA: Diagnosis present

## 2017-07-24 DIAGNOSIS — M1990 Unspecified osteoarthritis, unspecified site: Secondary | ICD-10-CM | POA: Diagnosis present

## 2017-07-24 DIAGNOSIS — J189 Pneumonia, unspecified organism: Secondary | ICD-10-CM | POA: Diagnosis present

## 2017-07-24 DIAGNOSIS — R627 Adult failure to thrive: Secondary | ICD-10-CM | POA: Diagnosis present

## 2017-07-24 DIAGNOSIS — Z9884 Bariatric surgery status: Secondary | ICD-10-CM

## 2017-07-24 LAB — URINALYSIS, ROUTINE W REFLEX MICROSCOPIC
BILIRUBIN URINE: NEGATIVE
Glucose, UA: NEGATIVE mg/dL
KETONES UR: NEGATIVE mg/dL
Nitrite: NEGATIVE
PH: 5 (ref 5.0–8.0)
PROTEIN: 100 mg/dL — AB
Specific Gravity, Urine: 1.009 (ref 1.005–1.030)

## 2017-07-24 LAB — COMPREHENSIVE METABOLIC PANEL
ALT: 11 U/L — ABNORMAL LOW (ref 14–54)
AST: 19 U/L (ref 15–41)
Albumin: 2.5 g/dL — ABNORMAL LOW (ref 3.5–5.0)
Alkaline Phosphatase: 115 U/L (ref 38–126)
BUN: 101 mg/dL — ABNORMAL HIGH (ref 6–20)
CO2: 14 mmol/L — ABNORMAL LOW (ref 22–32)
Calcium: 12.3 mg/dL — ABNORMAL HIGH (ref 8.9–10.3)
Chloride: >130 mmol/L (ref 101–111)
Creatinine, Ser: 7.52 mg/dL — ABNORMAL HIGH (ref 0.44–1.00)
GFR calc Af Amer: 6 mL/min — ABNORMAL LOW (ref >60)
GFR calc non Af Amer: 5 mL/min — ABNORMAL LOW (ref >60)
Glucose, Bld: 108 mg/dL — ABNORMAL HIGH (ref 65–99)
Potassium: 6.9 mmol/L (ref 3.5–5.1)
Sodium: 157 mmol/L — ABNORMAL HIGH (ref 135–145)
Total Bilirubin: 1 mg/dL (ref 0.3–1.2)
Total Protein: 6.7 g/dL (ref 6.5–8.1)

## 2017-07-24 LAB — CBC WITH DIFFERENTIAL/PLATELET
Basophils Absolute: 0 10*3/uL (ref 0.0–0.1)
Basophils Relative: 0 %
Eosinophils Absolute: 0 10*3/uL (ref 0.0–0.7)
Eosinophils Relative: 0 %
HCT: 34.4 % — ABNORMAL LOW (ref 36.0–46.0)
Hemoglobin: 10.4 g/dL — ABNORMAL LOW (ref 12.0–15.0)
Lymphocytes Relative: 11 %
Lymphs Abs: 3 10*3/uL (ref 0.7–4.0)
MCH: 28.3 pg (ref 26.0–34.0)
MCHC: 30.2 g/dL (ref 30.0–36.0)
MCV: 93.5 fL (ref 78.0–100.0)
Monocytes Absolute: 1.6 10*3/uL — ABNORMAL HIGH (ref 0.1–1.0)
Monocytes Relative: 6 %
Neutro Abs: 22.7 10*3/uL — ABNORMAL HIGH (ref 1.7–7.7)
Neutrophils Relative %: 83 %
Platelets: 176 10*3/uL (ref 150–400)
RBC: 3.68 MIL/uL — ABNORMAL LOW (ref 3.87–5.11)
RDW: 21.2 % — ABNORMAL HIGH (ref 11.5–15.5)
WBC: 27.3 10*3/uL — ABNORMAL HIGH (ref 4.0–10.5)

## 2017-07-24 LAB — BASIC METABOLIC PANEL
BUN: 98 mg/dL — ABNORMAL HIGH (ref 6–20)
BUN: 88 mg/dL — ABNORMAL HIGH (ref 6–20)
CALCIUM: 11.4 mg/dL — AB (ref 8.9–10.3)
CO2: 14 mmol/L — AB (ref 22–32)
CO2: 9 mmol/L — ABNORMAL LOW (ref 22–32)
CREATININE: 6.93 mg/dL — AB (ref 0.44–1.00)
Calcium: 10 mg/dL (ref 8.9–10.3)
Chloride: >130 mmol/L (ref 101–111)
Creatinine, Ser: 5.95 mg/dL — ABNORMAL HIGH (ref 0.44–1.00)
GFR calc Af Amer: 8 mL/min — ABNORMAL LOW (ref >60)
GFR calc non Af Amer: 5 mL/min — ABNORMAL LOW (ref >60)
GFR calc non Af Amer: 7 mL/min — ABNORMAL LOW (ref >60)
GFR, EST AFRICAN AMERICAN: 6 mL/min — AB (ref >60)
Glucose, Bld: 65 mg/dL (ref 65–99)
Glucose, Bld: 86 mg/dL (ref 65–99)
Potassium: 6 mmol/L — ABNORMAL HIGH (ref 3.5–5.1)
Potassium: 5.4 mmol/L — ABNORMAL HIGH (ref 3.5–5.1)
SODIUM: 161 mmol/L — AB (ref 135–145)
Sodium: 157 mmol/L — ABNORMAL HIGH (ref 135–145)

## 2017-07-24 LAB — BRAIN NATRIURETIC PEPTIDE: B Natriuretic Peptide: 160.4 pg/mL — ABNORMAL HIGH (ref 0.0–100.0)

## 2017-07-24 LAB — PROTIME-INR
INR: 2.13
Prothrombin Time: 23.6 seconds — ABNORMAL HIGH (ref 11.4–15.2)

## 2017-07-24 LAB — CBC
HEMATOCRIT: 36.8 % (ref 36.0–46.0)
Hemoglobin: 10.7 g/dL — ABNORMAL LOW (ref 12.0–15.0)
MCH: 27.7 pg (ref 26.0–34.0)
MCHC: 29.1 g/dL — ABNORMAL LOW (ref 30.0–36.0)
MCV: 95.3 fL (ref 78.0–100.0)
PLATELETS: 187 10*3/uL (ref 150–400)
RBC: 3.86 MIL/uL — AB (ref 3.87–5.11)
RDW: 22 % — AB (ref 11.5–15.5)
WBC: 19.5 10*3/uL — AB (ref 4.0–10.5)

## 2017-07-24 LAB — CREATININE, SERUM
Creatinine, Ser: 6.9 mg/dL — ABNORMAL HIGH (ref 0.44–1.00)
GFR, EST AFRICAN AMERICAN: 6 mL/min — AB (ref >60)
GFR, EST NON AFRICAN AMERICAN: 5 mL/min — AB (ref >60)

## 2017-07-24 LAB — I-STAT CG4 LACTIC ACID, ED: Lactic Acid, Venous: 1.73 mmol/L (ref 0.5–1.9)

## 2017-07-24 LAB — PHOSPHORUS: Phosphorus: 4.2 mg/dL (ref 2.5–4.6)

## 2017-07-24 LAB — TSH: TSH: 0.536 u[IU]/mL (ref 0.350–4.500)

## 2017-07-24 LAB — TROPONIN I: Troponin I: 0.08 ng/mL (ref <0.03)

## 2017-07-24 LAB — MAGNESIUM: MAGNESIUM: 2.1 mg/dL (ref 1.7–2.4)

## 2017-07-24 LAB — MRSA PCR SCREENING: MRSA BY PCR: POSITIVE — AB

## 2017-07-24 MED ORDER — DEXTROSE 50 % IV SOLN
50.0000 mL | Freq: Once | INTRAVENOUS | Status: AC
  Administered 2017-07-24: 50 mL via INTRAVENOUS
  Filled 2017-07-24: qty 50

## 2017-07-24 MED ORDER — ENOXAPARIN SODIUM 30 MG/0.3ML ~~LOC~~ SOLN
30.0000 mg | SUBCUTANEOUS | Status: DC
  Administered 2017-07-25 – 2017-07-29 (×5): 30 mg via SUBCUTANEOUS
  Filled 2017-07-24 (×5): qty 0.3

## 2017-07-24 MED ORDER — INSULIN ASPART 100 UNIT/ML ~~LOC~~ SOLN
10.0000 [IU] | Freq: Once | SUBCUTANEOUS | Status: AC
  Administered 2017-07-24: 10 [IU] via INTRAVENOUS
  Filled 2017-07-24: qty 1

## 2017-07-24 MED ORDER — SODIUM CHLORIDE 0.9 % IV SOLN: INTRAVENOUS | Status: DC | PRN

## 2017-07-24 MED ORDER — STROKE: EARLY STAGES OF RECOVERY BOOK
Freq: Once | Status: DC
  Filled 2017-07-24: qty 1

## 2017-07-24 MED ORDER — ACETAMINOPHEN 325 MG PO TABS
650.0000 mg | ORAL_TABLET | Freq: Four times a day (QID) | ORAL | Status: DC | PRN
  Filled 2017-07-24: qty 2

## 2017-07-24 MED ORDER — SODIUM CHLORIDE 0.9 % IV SOLN: INTRAVENOUS | Status: DC

## 2017-07-24 MED ORDER — LIDOCAINE VISCOUS 2 % MT SOLN
15.0000 mL | Freq: Three times a day (TID) | OROMUCOSAL | Status: DC
  Administered 2017-07-24 – 2017-08-04 (×28): 15 mL via OROMUCOSAL
  Filled 2017-07-24 (×37): qty 15

## 2017-07-24 MED ORDER — SODIUM CHLORIDE 0.9 % IV BOLUS (SEPSIS)
1000.0000 mL | Freq: Once | INTRAVENOUS | Status: DC
  Administered 2017-07-24: 1000 mL via INTRAVENOUS

## 2017-07-24 MED ORDER — SODIUM CHLORIDE 0.9 % IV BOLUS (SEPSIS)
1000.0000 mL | Freq: Once | INTRAVENOUS | Status: AC
  Administered 2017-07-24: 1000 mL via INTRAVENOUS

## 2017-07-24 MED ORDER — SODIUM POLYSTYRENE SULFONATE 15 GM/60ML PO SUSP
30.0000 g | Freq: Once | ORAL | Status: AC
  Administered 2017-07-24: 30 g via ORAL
  Filled 2017-07-24: qty 120

## 2017-07-24 MED ORDER — MUPIROCIN 2 % EX OINT
1.0000 "application " | TOPICAL_OINTMENT | Freq: Two times a day (BID) | CUTANEOUS | Status: AC
  Administered 2017-07-24 – 2017-07-29 (×10): 1 via NASAL
  Filled 2017-07-24 (×2): qty 22

## 2017-07-24 MED ORDER — BENZOCAINE 20 % MT GEL: 1.0000 "application " | Freq: Three times a day (TID) | OROMUCOSAL | Status: DC

## 2017-07-24 MED ORDER — SORBITOL 70 % SOLN
960.0000 mL | TOPICAL_OIL | Freq: Once | ORAL | Status: DC
  Filled 2017-07-24: qty 240

## 2017-07-24 MED ORDER — ASPIRIN EC 81 MG PO TBEC: 81.0000 mg | DELAYED_RELEASE_TABLET | Freq: Every day | ORAL | Status: DC

## 2017-07-24 MED ORDER — POLYETHYLENE GLYCOL 3350 17 G PO PACK: 17.0000 g | PACK | Freq: Three times a day (TID) | ORAL | Status: DC

## 2017-07-24 MED ORDER — ORAL CARE MOUTH RINSE: 15.0000 mL | Freq: Two times a day (BID) | OROMUCOSAL | Status: DC

## 2017-07-24 MED ORDER — PIPERACILLIN-TAZOBACTAM IN DEX 2-0.25 GM/50ML IV SOLN
2.2500 g | Freq: Three times a day (TID) | INTRAVENOUS | Status: DC
  Administered 2017-07-24 – 2017-07-26 (×5): 2.25 g via INTRAVENOUS
  Filled 2017-07-24 (×6): qty 50

## 2017-07-24 MED ORDER — SODIUM CHLORIDE 0.9 % IV BOLUS (SEPSIS): 1000.0000 mL | Freq: Once | INTRAVENOUS | Status: DC

## 2017-07-24 MED ORDER — METHIMAZOLE 5 MG PO TABS
5.0000 mg | ORAL_TABLET | Freq: Every day | ORAL | Status: DC
  Administered 2017-07-30 – 2017-08-05 (×5): 5 mg via ORAL
  Filled 2017-07-24 (×13): qty 1

## 2017-07-24 MED ORDER — CHLORHEXIDINE GLUCONATE CLOTH 2 % EX PADS
6.0000 | MEDICATED_PAD | Freq: Every day | CUTANEOUS | Status: AC
  Administered 2017-07-25 – 2017-07-29 (×5): 6 via TOPICAL

## 2017-07-24 MED ORDER — SODIUM BICARBONATE 8.4 % IV SOLN
50.0000 meq | Freq: Once | INTRAVENOUS | Status: AC
  Administered 2017-07-24: 50 meq via INTRAVENOUS
  Filled 2017-07-24: qty 50

## 2017-07-24 MED ORDER — CHLORHEXIDINE GLUCONATE CLOTH 2 % EX PADS: 6.0000 | MEDICATED_PAD | Freq: Every day | CUTANEOUS | Status: DC

## 2017-07-24 MED ORDER — ATORVASTATIN CALCIUM 10 MG PO TABS: 20.0000 mg | ORAL_TABLET | Freq: Every day | ORAL | Status: DC

## 2017-07-24 MED ORDER — SODIUM CHLORIDE 0.9 % IV BOLUS (SEPSIS)
2000.0000 mL | Freq: Once | INTRAVENOUS | Status: AC
  Administered 2017-07-24: 2000 mL via INTRAVENOUS

## 2017-07-24 MED ORDER — ORAL CARE MOUTH RINSE
15.0000 mL | Freq: Two times a day (BID) | OROMUCOSAL | Status: DC
  Administered 2017-07-25 – 2017-08-05 (×17): 15 mL via OROMUCOSAL

## 2017-07-24 MED ORDER — SODIUM CHLORIDE 0.45 % IV SOLN
INTRAVENOUS | Status: DC
  Administered 2017-07-24: 19:00:00 via INTRAVENOUS

## 2017-07-24 MED ORDER — CHLORHEXIDINE GLUCONATE 0.12 % MT SOLN
15.0000 mL | Freq: Two times a day (BID) | OROMUCOSAL | Status: DC
  Administered 2017-07-25 – 2017-08-05 (×22): 15 mL via OROMUCOSAL
  Filled 2017-07-24 (×17): qty 15

## 2017-07-24 MED ORDER — MAGNESIUM CITRATE PO SOLN: 1.0000 | Freq: Once | ORAL | Status: DC | PRN

## 2017-07-24 MED ORDER — SODIUM POLYSTYRENE SULFONATE 15 GM/60ML PO SUSP
30.0000 g | ORAL | Status: AC
  Administered 2017-07-24: 30 g via RECTAL

## 2017-07-24 MED ORDER — SODIUM CHLORIDE 0.9 % IV SOLN
INTRAVENOUS | Status: DC
  Administered 2017-07-24: 50 mL/h via INTRAVENOUS

## 2017-07-24 MED ORDER — SODIUM CHLORIDE 0.45 % IV BOLUS
500.0000 mL | Freq: Once | INTRAVENOUS | Status: AC
  Administered 2017-07-24: 500 mL via INTRAVENOUS

## 2017-07-24 MED ORDER — PIPERACILLIN-TAZOBACTAM IN DEX 2-0.25 GM/50ML IV SOLN
2.2500 g | Freq: Four times a day (QID) | INTRAVENOUS | Status: DC
  Administered 2017-07-24: 2.25 g via INTRAVENOUS
  Filled 2017-07-24 (×2): qty 50

## 2017-07-24 MED ORDER — ACETAMINOPHEN 650 MG RE SUPP: 650.0000 mg | Freq: Four times a day (QID) | RECTAL | Status: DC | PRN

## 2017-07-24 NOTE — H&P (Addendum)
History and Physical    Katie Evans ZOX:[id number] DOB: [birth year] DOA: 07/24/2017  PCP: Oneal Grout, MD  Patient coming from: [facility]  I have personally briefly reviewed patient's old medical records in [facility]  Chief Complaint: Confusion  HPI: Katie Evans is a 71 y.o. female with medical history significant of past medical history of chronic renal disease stage III, chronic heart failure, chronic pulmonary embolism on Xarelto, major depressive disorder is sent here from [facility] for altered mental status. The patient cannot provide history and after I called [facility] and has been unsuccessful. Most of the history is obtained from the chart, per candy in place she was sent here for alternative status normally she is alert and awake and oriented 4 patient has large mouth sores and has been coughing. In the ED  ED Course:  She was found to be mildly hypotensive, with mild tachycardia in the 100s, hypernatremia hyperkalemia hyperchloremia new acute renal failure of 7.2 (with a previous creatinine 1.3), metabolic acidosis, lactic acid 1.2 white count of 27, CT scan of the head showed possible moderate acute infarct, CT scan of the abdomen and pelvis showed large stool burden.  Review of Systems: As per HPI otherwise 10 point review of systems negative.    Past Medical History:  Diagnosis Date  . Allergic rhinitis   . Anemia   . Anxiety   . Arthritis   . Bruising   . Carpal tunnel syndrome   . CHF (congestive heart failure) (HCC)   . Chronic diarrhea   . Chronic pulmonary embolism (HCC)   . Demand ischemia of myocardium (HCC)   . Flatulence   . Generalized anxiety disorder   . GERD without esophagitis   . Hemorrhoids   . Hyperlipidemia   . Hypertension   . Hyperthyroidism   . Hypokalemia   . Long term current use of anticoagulant therapy   . Major depressive disorder   . Metabolic encephalopathy   . Overactive bladder   . Protein calorie  malnutrition (HCC)   . Psychosis   . Renal disorder   . RLS (restless legs syndrome)   . Thrombocytopathia (HCC)   . Thrombocytopenia (HCC)   . Vaginal atrophy     Past Surgical History:  Procedure Laterality Date  . ABDOMINAL HYSTERECTOMY    . APPENDECTOMY    . BACK SURGERY    . GASTRIC BYPASS    . SHOULDER SURGERY    . SPINE SURGERY     spinal fusion  . TONSILLECTOMY       reports that she has never smoked. She has never used smokeless tobacco. She reports that she does not drink alcohol or use drugs.  Allergies  Allergen Reactions  . Remeron [Mirtazapine]     Per MAR. No rxn listed    Family History  Problem Relation Age of Onset  . Thyroid disease Neg Hx     Prior to Admission medications   Medication Sig Start Date End Date Taking? Authorizing Provider  aspirin EC 81 MG tablet Take 81 mg by mouth daily.   Yes [provider]  atorvastatin (LIPITOR) 20 MG tablet Take 20 mg by mouth daily.   Yes [provider]  benzocaine (ORAL ANALGESIC MAX ST) 20 % GEL Use as directed 1 application in the mouth or throat 3 (three) times daily.   Yes [provider]  Cholecalciferol (D3-1000 PO) Take 1,000 Units by mouth daily.    Yes [provider]  ciprofloxacin (CIPRO) 250 MG tablet Take 250 mg by mouth 2 (two) times daily.   Yes [provider]  cyclobenzaprine (FLEXERIL) 5 MG tablet Take 5 mg by mouth 3 (three) times daily as needed for muscle spasms.   Yes [provider]  famotidine (PEPCID) 20 MG tablet Take 20 mg by mouth daily.    Yes [provider]  HYDROcodone-acetaminophen (NORCO/VICODIN) 5-325 MG tablet Take 1-2 tablets by mouth every 4 (four) hours as needed for moderate pain.   Yes [provider]  Liniments (SALONPAS EX) Apply 1 patch topically daily as needed (Pain).   Yes [provider]  loperamide (IMODIUM) 2 MG capsule Take 2 mg by mouth 4 (four) times daily as needed for diarrhea  or loose stools.    Yes [provider]  Menthol, Topical Analgesic, (ICY HOT EX) Apply 1 application topically. Apply 7.5% cream to both shoulders at 9AM, 3PM, 9PM.  Hold for skin irritation.   Yes [provider]  methimazole (TAPAZOLE) 5 MG tablet Take 1 tablet (5 mg total) by mouth daily. 06/06/17  Yes Romero Belling, MD  nadolol (CORGARD) 40 MG tablet Take 40 mg by mouth daily.    Yes [provider]  nitroGLYCERIN (NITROSTAT) 0.4 MG SL tablet Place 0.4 mg under the tongue every 5 (five) minutes as needed for chest pain. For 3 doses as needed for chest pain   Yes [provider]  OVER THE COUNTER MEDICATION Take 120 mLs by mouth 2 (two) times daily. MeddPass   Yes [provider]  oxybutynin (DITROPAN) 5 MG tablet Take 5 mg by mouth daily.    Yes [provider]  potassium chloride SA (K-DUR,KLOR-CON) 20 MEQ tablet Take 20 mEq by mouth 3 (three) times daily. Take with food.  Do not crush.  May dissolve.   Yes [provider]  pramipexole (MIRAPEX) 1.5 MG tablet Take 1.5 mg by mouth 2 (two) times daily.    Yes [provider]  PRESCRIPTION MEDICATION Apply 1 application topically 2 (two) times daily. Triple Cream 1:1:1, Nystatin-Zinc-Tac 0.1%. Applied to bilateral inner groins, inner thighs, bilateral buttocks, posterior thighs.   Yes [provider]  rivaroxaban (XARELTO) 10 MG TABS tablet Take 10 mg by mouth daily.   Yes [provider]  sertraline (ZOLOFT) 100 MG tablet Take 200 mg by mouth daily. Take two 100 mg tablets to = 200 mg po QD for depression   Yes [provider]  shark liver oil-cocoa butter (PREPARATION H) 0.25-3-85.5 % suppository Place 1 suppository rectally 2 (two) times daily as needed for hemorrhoids.   Yes [provider]  topiramate (TOPAMAX) 50 MG tablet Take 50 mg by mouth 2 (two) times daily.    Yes [provider]  torsemide (DEMADEX) 20 MG tablet Take 20 mg  by mouth daily.    Yes [provider]    Physical Exam: Vitals:   07/24/17 1042 07/24/17 1043 07/24/17 1100 07/24/17 1253  BP: (!) 129/113  98/66   Pulse: 66  67   Resp:   18   Temp: (!) 97.5 F (36.4 C)     TempSrc: Rectal     SpO2: 100%  100% 100%  Weight:  84.8 kg (187 lb)    Height:  5\' 4"  (1.626 m)      Constitutional: Comfortable acute distress unable to answer questions. Vitals:   07/24/17 1042 07/24/17 1043 07/24/17 1100 07/24/17 1253  BP: (!) 129/113  98/66  Pulse: 66  67   Resp:   18   Temp: (!) 97.5 F (36.4 C)     TempSrc: Rectal     SpO2: 100%  100% 100%  Weight:  84.8 kg (187 lb)    Height:  5\' 4"  (1.626 m)     Eyes: Pupils equal and reactive to light. ENMT: Extremely dry mucous membrane no exudate Neck: Supple no JVD Respiratory: Good air movement and clear to auscultation.  Cardiovascular: Regular rate and rhythm with possible S1-S2.Marland Kitchen  Abdomen: Positive bowel sounds soft nontender nondistended Musculoskeletal: No clubbing cyanosis or edema. Skin: No rashes. Neurologic: CN 2-12 grossly intact. Sensation intact, DTR normal. Strength 5/5 in all 4.  Psychiatric: Normal judgment and insight. Alert and oriented x 3. Normal mood.   Labs on Admission: I have personally reviewed following labs and imaging studies  CBC:  Recent Labs Lab 07/24/17 1057  WBC 27.3*  NEUTROABS 22.7*  HGB 10.4*  HCT 34.4*  MCV 93.5  PLT 176   Basic Metabolic Panel:  Recent Labs Lab 07/24/17 1057  NA 157*  K 6.9*  CL >130*  CO2 14*  GLUCOSE 108*  BUN 101*  CREATININE 7.52*  CALCIUM 12.3*   GFR: Estimated Creatinine Clearance: 7.5 mL/min (A) (by C-G formula based on SCr of 7.52 mg/dL (H)). Liver Function Tests:  Recent Labs Lab 07/24/17 1057  AST 19  ALT 11*  ALKPHOS 115  BILITOT 1.0  PROT 6.7  ALBUMIN 2.5*   No results for input(s): LIPASE, AMYLASE in the last 168 hours. No results for input(s): AMMONIA in the last 168 hours. Coagulation  Profile:  Recent Labs Lab 07/24/17 1057  INR 2.13   Cardiac Enzymes:  Recent Labs Lab 07/24/17 1057  TROPONINI 0.08*   BNP (last 3 results) No results for input(s): PROBNP in the last 8760 hours. HbA1C: No results for input(s): HGBA1C in the last 72 hours. CBG: No results for input(s): GLUCAP in the last 168 hours. Lipid Profile: No results for input(s): CHOL, HDL, LDLCALC, TRIG, CHOLHDL, LDLDIRECT in the last 72 hours. Thyroid Function Tests:  Recent Labs  07/24/17 1057  TSH 0.536   Anemia Panel: No results for input(s): VITAMINB12, FOLATE, FERRITIN, TIBC, IRON, RETICCTPCT in the last 72 hours. Urine analysis:    Component Value Date/Time   COLORURINE YELLOW 06/25/2014 2230   APPEARANCEUR CLEAR 06/25/2014 2230   LABSPEC 1.007 06/25/2014 2230   PHURINE 6.0 06/25/2014 2230   GLUCOSEU NEGATIVE 06/25/2014 2230   HGBUR NEGATIVE 06/25/2014 2230   BILIRUBINUR NEGATIVE 06/25/2014 2230   KETONESUR NEGATIVE 06/25/2014 2230   PROTEINUR NEGATIVE 06/25/2014 2230   UROBILINOGEN 1.0 06/25/2014 2230   NITRITE NEGATIVE 06/25/2014 2230   LEUKOCYTESUR TRACE (A) 06/25/2014 2230    Radiological Exams on Admission: Ct Abdomen Pelvis Wo Contrast  Result Date: 07/24/2017 CLINICAL DATA:  Abdominal pain. EXAM: CT ABDOMEN AND PELVIS WITHOUT CONTRAST TECHNIQUE: Multidetector CT imaging of the abdomen and pelvis was performed following the standard protocol without IV contrast. COMPARISON:  None. FINDINGS: Lower chest: Subsegmental atelectasis in the left lower lobe. Patchy centrilobular nodules in the left lower lobe. Heart is normal in size. Hepatobiliary: No focal liver abnormality. Layering stones or sludge in the gallbladder. No biliary dilatation. Pancreas: Pancreatic atrophy.  No ductal dilatation. Spleen: Normal in size without focal abnormality. Adrenals/Urinary Tract: The adrenal glands are unremarkable. No renal calculi or hydronephrosis. The bladder is decompressed by a Foley  catheter. Stomach/Bowel: Prior Roux-en-Y gastric bypass surgery. The appendix is surgically  absent. No bowel wall thickening, distention, or surrounding inflammatory changes. Large stool ball in the rectum. Scattered left-sided colonic diverticulosis. Vascular/Lymphatic: Aortic atherosclerosis. No enlarged abdominal or pelvic lymph nodes. Reproductive: Status post hysterectomy. No adnexal masses. Other: Small fat containing umbilical hernia.  No free fluid. Musculoskeletal: No acute or significant osseous findings. Severe multilevel degenerative changes of the lumbar spine with stepwise grade 1 retrolisthesis at L1-L2 and L2-L3. IMPRESSION: 1. No acute intra-abdominal process. 2. Large stool ball in the rectum.  Correlate for fecal impaction. 3. Small amount of patchy centrilobular nodularity in the left lower lobe, which could reflect aspiration. 4.  Aortic atherosclerosis (ICD10-I70.0). Electronically Signed   By: Obie Dredge M.D.   On: 07/24/2017 13:16   Ct Head Wo Contrast  Result Date: 07/24/2017 CLINICAL DATA:  Altered mental status for a month.  Nonverbal.  Hip EXAM: CT HEAD WITHOUT CONTRAST TECHNIQUE: Contiguous axial images were obtained from the base of the skull through the vertex without intravenous contrast. COMPARISON:  11/25/2014 FINDINGS: Brain: Moderate area of poor gray-white differentiation in the anterior and lateral left frontal lobe. The subjacent white matter is also more low density than the right. Certainty of this finding is diminished by asymmetric patient positioning and streak artifact. No hemorrhage, hydrocephalus, or masslike findings. There is periventricular chronic microvascular ischemic change that is mild. Mild cerebral volume loss. Vascular: No hyperdense vessel.  Atherosclerotic calcifications. Skull: Negative Sinuses/Orbits: Chronic left maxillary sinusitis with near complete opacification of the visualized sinus, with sclerotic wall thickening. Same findings were seen  on 2015 comparison. Other: Case discussed with Dr. Juleen China. IMPRESSION: 1. Possible moderate acute infarct in the lateral left frontal lobe. Certainty is diminished by artifact from patient positioning. 2. Chronic microvascular ischemia. 3. Chronic left maxillary sinusitis. Electronically Signed   By: Marnee Spring M.D.   On: 07/24/2017 11:48   Dg Chest Portable 1 View  Result Date: 07/24/2017 CLINICAL DATA:  Hypoxemia.  Altered mental status. EXAM: PORTABLE CHEST 1 VIEW COMPARISON:  05/10/2015 FINDINGS: The heart size and mediastinal contours are within normal limits. Both lungs are clear. Left shoulder prosthesis again noted. IMPRESSION: No active disease. Electronically Signed   By: Myles Rosenthal M.D.   On: 07/24/2017 11:27   Dg Knee Complete 4 Views Right  Result Date: 07/24/2017 CLINICAL DATA:  Right knee pain.  Altered mental status. EXAM: RIGHT KNEE - COMPLETE 4+ VIEW COMPARISON:  None. FINDINGS: Right knee prosthesis in expected position. No evidence of hardware failure or loosening. Generalized osteopenia noted. No evidence of fracture or dislocation. No evidence of knee joint effusion. IMPRESSION: No acute findings. Electronically Signed   By: Myles Rosenthal M.D.   On: 07/24/2017 11:29    EKG: Independently reviewed. Normal sinus rhythm left axis deviation no peak T waves.  Assessment/Plan Acute metabolic encephalopathy: Unclear, she has no murmurs on rubs on physical exam, differential includesurinary tract infection, versus hypernatremia versus extreme dehydration versus CVA. Or combination of the above. CT scan of the head showed possible CVA. Patient cannot cooperate to perform a physical exam. We'll get an MRI of the brain, will do a swallowing evaluation, nothing by mouth except for meds. Carotid Doppler. Physical therapy consult and perform a swallowing evaluation in the morning.   AKI (acute kidney injury) (HCC) Her mucous membranes are extremely dry, with some ulcers, she is  mildly tachycardic with mild hypertension, will start on aggressive IV fluid hydration, hold her torsemide. Recheck a basic metabolic panel in the morning.  Possible urinary  tract infection: Foley and only about 20 mL of white chalky material came out. She has a leukocytosis of 27 she has remained afebrile we'll start her empirically on IV antibiotics, send urine culture and UA. Ordered blood cultures.  Hyperkalemia: No peak T waves on EKG, no prolonged PR, QRS or QT intervals., she was given D5 and insulin, also an amp of bicarbonate. Likely due to aki and KCL supplementation. We'll give oral Kayexalate now.  Hypernatremia: We will fluid resuscitate the patient with normal saline 1 she has had 2-3 L of normal saline can switch to half-normal saline.  Diastolic CHF, acute on chronic (HCC) Hold oral antihypertensive medication. No previous 2-D echo and records will go ahead and check one.  Pulmonary hypertension (HCC)  Essential hypertension Hold all antihypertensive medication.  Protein-calorie malnutrition (HCC) We will nutrition  Chronic pulmonary embolism (HCC) Hold Xarelto due to acute renal failure start Lovenox.  Anemia of chronic disease Hemoglobin seems to be at baseline.  Long term current use of anticoagulant therapy Hold Xarelto.  Obstipation We will give a SMOG and several doses of MiraLAX.    DVT prophylaxis: lovenox Code Status: full Family Communication: none, drug called her sister at [phone] mobile device and have been unsuccessful. Disposition Plan:  Consults called: none Admission status: inpatient   Marinda Elk MD [facility] Pager [phone]  If 7PM-7AM, please contact night-coverage [email or web address] Password [email or web address]  07/24/2017, 2:18 PM

## 2017-07-24 NOTE — ED Notes (Signed)
Patient transported to CT 
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
## 2017-07-24 NOTE — Progress Notes (Addendum)
Pharmacy Antibiotic Note  Katie Evans is a 71 y.o. female admitted on 07/24/2017 with possible UTI.  Pharmacy has been consulted for zosyn dosing.  Today, 07/24/2017   UA and culture not yet collected  AKI on CKD - SCr > 7  WBC increased  LA WNL  Plan:  Zosyn 2.25gm IV q8h over 30min infusion  Zosyn given prior to collection of urine cx  Height: 5\' 4"  (162.6 cm) Weight: 187 lb (84.8 kg) IBW/kg (Calculated) : 54.7  Temp (24hrs), Avg:97.5 F (36.4 C), Min:97.5 F (36.4 C), Max:97.5 F (36.4 C)   Recent Labs Lab 07/24/17 1057 07/24/17 1117  WBC 27.3*  --   CREATININE 7.52*  --   LATICACIDVEN  --  1.73    Estimated Creatinine Clearance: 7.5 mL/min (A) (by C-G formula based on SCr of 7.52 mg/dL (H)).    Allergies  Allergen Reactions  . Remeron [Mirtazapine]     Per MAR. No rxn listed    Antimicrobials this admission: 8/29 UCx  Dose adjustments this admission:   Microbiology results: 8/29 BCx:   UCx: to be collected   Thank you for allowing pharmacy to be a part of this patient's care.  Juliette Alcideustin Zeigler, PharmD, BCPS.   Pager: [phone] 07/24/2017 5:19 PM

## 2017-07-24 NOTE — Progress Notes (Signed)
CRITICAL VALUE ALERT  Critical Value:  Cl >130  Date & Time Notied: 07/24/17 2343  Provider Notified: Merdis DelayK. Schorr, NP  Orders Received/Actions taken: Continue to monitor.

## 2017-07-24 NOTE — ED Notes (Signed)
Date and time results received: 07/24/17 1158 (use smartphrase ".now" to insert current time)  Test: K, chloride, Troponin Critical Value: K 6.9, chloride >130, troponin 0.08  Name of Provider Notified: [place]  Orders Received? Or Actions Taken?: no new orders given

## 2017-07-24 NOTE — Progress Notes (Signed)
Notified NP of continued inconsistencies with BP, palpated in 60's, monitor shows 200's, zero urine output.  Awaiting orders.

## 2017-07-24 NOTE — Progress Notes (Signed)
RN notified NP of patient little urine output and current doppler BP of 64. Will continue to monitor.

## 2017-07-24 NOTE — Procedures (Signed)
Arterial Catheter Insertion Procedure Note Katie Evans [id number] 15-Sep-1946  Procedure: Insertion of Arterial Catheter  Indications: Blood pressure monitoring and Frequent blood sampling  Procedure Details Consent: Unable to obtain consent because of altered level of consciousness. Time Out: Verified patient identification, verified procedure, site/side was marked, verified correct patient position, special equipment/implants available, medications/allergies/relevent history reviewed, required imaging and test results available.  Performed  Maximum sterile technique was used including antiseptics, cap, gloves, gown, hand hygiene and mask. Skin prep: Chlorhexidine; local anesthetic administered 20 gauge catheter was inserted into left radial artery using the Seldinger technique.  Evaluation Blood flow good; BP tracing good. Complications: No apparent complications.   Katie Evans P 07/24/2017

## 2017-07-24 NOTE — ED Notes (Signed)
Bed: WA09 Expected date:  Expected time:  Means of arrival:  Comments: EMS/AMS 

## 2017-07-24 NOTE — ED Triage Notes (Signed)
[facility] related to AMS for a month; normally alert and oriented x4. Pt has large mouth sore and cough. Pt responds to verbal and pain; does not follow commands.

## 2017-07-24 NOTE — Progress Notes (Signed)
A-Line in place. Current BP per A-Line 76/40 (54). HR 55. No urine output. RN notified NP of current VS and U/O. Will continue to monitor.

## 2017-07-24 NOTE — Progress Notes (Signed)
PHARMACY NOTE:  ANTIMICROBIAL RENAL DOSAGE ADJUSTMENT  Current antimicrobial regimen includes a mismatch between antimicrobial dosage and estimated renal function.  As per policy approved by the Pharmacy & Therapeutics and Medical Executive Committees, the antimicrobial dosage will be adjusted accordingly.  Current antimicrobial dosage:  Zosyn 2.25 g IV q6 hr  Indication: sepsis d/t UTI  Renal Function:  Estimated Creatinine Clearance: 7.5 mL/min (A) (by C-G formula based on SCr of 7.52 mg/dL (H)). []      On intermittent HD, scheduled: []      On CRRT    Antimicrobial dosage has been changed to:  Zosyn 2.25 g IV q8 hr  Additional comments:   Thank you for allowing pharmacy to be a part of this patient's care.  Bernadene Personrew Jemmie Ledgerwood, PharmD, BCPS Pager: [phone] 07/25/2017, 7:09 AM

## 2017-07-25 ENCOUNTER — Inpatient Hospital Stay (HOSPITAL_COMMUNITY): Payer: Medicare Other

## 2017-07-25 DIAGNOSIS — I959 Hypotension, unspecified: Secondary | ICD-10-CM

## 2017-07-25 DIAGNOSIS — E870 Hyperosmolality and hypernatremia: Secondary | ICD-10-CM

## 2017-07-25 DIAGNOSIS — A419 Sepsis, unspecified organism: Secondary | ICD-10-CM

## 2017-07-25 DIAGNOSIS — I361 Nonrheumatic tricuspid (valve) insufficiency: Secondary | ICD-10-CM

## 2017-07-25 DIAGNOSIS — R6521 Severe sepsis with septic shock: Secondary | ICD-10-CM

## 2017-07-25 DIAGNOSIS — N179 Acute kidney failure, unspecified: Secondary | ICD-10-CM

## 2017-07-25 LAB — BASIC METABOLIC PANEL
ANION GAP: 11 (ref 5–15)
ANION GAP: 11 (ref 5–15)
Anion gap: 10 (ref 5–15)
BUN: 84 mg/dL — ABNORMAL HIGH (ref 6–20)
BUN: 81 mg/dL — ABNORMAL HIGH (ref 6–20)
BUN: 75 mg/dL — ABNORMAL HIGH (ref 6–20)
BUN: 74 mg/dL — ABNORMAL HIGH (ref 6–20)
BUN: 70 mg/dL — ABNORMAL HIGH (ref 6–20)
CALCIUM: 8.7 mg/dL — AB (ref 8.9–10.3)
CHLORIDE: 123 mmol/L — AB (ref 101–111)
CO2: 10 mmol/L — ABNORMAL LOW (ref 22–32)
CO2: 10 mmol/L — ABNORMAL LOW (ref 22–32)
CO2: 13 mmol/L — AB (ref 22–32)
CO2: 17 mmol/L — ABNORMAL LOW (ref 22–32)
CO2: 19 mmol/L — ABNORMAL LOW (ref 22–32)
Calcium: 9.5 mg/dL (ref 8.9–10.3)
Calcium: 9.6 mg/dL (ref 8.9–10.3)
Calcium: 9 mg/dL (ref 8.9–10.3)
Calcium: 8.9 mg/dL (ref 8.9–10.3)
Chloride: >130 mmol/L (ref 101–111)
Chloride: >130 mmol/L (ref 101–111)
Chloride: 119 mmol/L — ABNORMAL HIGH (ref 101–111)
Chloride: 116 mmol/L — ABNORMAL HIGH (ref 101–111)
Creatinine, Ser: 5.53 mg/dL — ABNORMAL HIGH (ref 0.44–1.00)
Creatinine, Ser: 5.57 mg/dL — ABNORMAL HIGH (ref 0.44–1.00)
Creatinine, Ser: 5 mg/dL — ABNORMAL HIGH (ref 0.44–1.00)
Creatinine, Ser: 4.9 mg/dL — ABNORMAL HIGH (ref 0.44–1.00)
Creatinine, Ser: 4.84 mg/dL — ABNORMAL HIGH (ref 0.44–1.00)
GFR calc Af Amer: 8 mL/min — ABNORMAL LOW (ref >60)
GFR calc Af Amer: 8 mL/min — ABNORMAL LOW (ref >60)
GFR calc Af Amer: 9 mL/min — ABNORMAL LOW (ref >60)
GFR calc Af Amer: 10 mL/min — ABNORMAL LOW (ref >60)
GFR calc non Af Amer: 7 mL/min — ABNORMAL LOW (ref >60)
GFR calc non Af Amer: 7 mL/min — ABNORMAL LOW (ref >60)
GFR calc non Af Amer: 8 mL/min — ABNORMAL LOW (ref >60)
GFR, EST AFRICAN AMERICAN: 10 mL/min — AB (ref >60)
GFR, EST NON AFRICAN AMERICAN: 8 mL/min — AB (ref >60)
GFR, EST NON AFRICAN AMERICAN: 8 mL/min — AB (ref >60)
GLUCOSE: 321 mg/dL — AB (ref 65–99)
Glucose, Bld: 134 mg/dL — ABNORMAL HIGH (ref 65–99)
Glucose, Bld: 194 mg/dL — ABNORMAL HIGH (ref 65–99)
Glucose, Bld: 297 mg/dL — ABNORMAL HIGH (ref 65–99)
Glucose, Bld: 241 mg/dL — ABNORMAL HIGH (ref 65–99)
POTASSIUM: 4.3 mmol/L (ref 3.5–5.1)
POTASSIUM: 3.8 mmol/L (ref 3.5–5.1)
Potassium: 4.8 mmol/L (ref 3.5–5.1)
Potassium: 4.7 mmol/L (ref 3.5–5.1)
Potassium: 3.8 mmol/L (ref 3.5–5.1)
SODIUM: 147 mmol/L — AB (ref 135–145)
Sodium: 154 mmol/L — ABNORMAL HIGH (ref 135–145)
Sodium: 152 mmol/L — ABNORMAL HIGH (ref 135–145)
Sodium: 146 mmol/L — ABNORMAL HIGH (ref 135–145)
Sodium: 146 mmol/L — ABNORMAL HIGH (ref 135–145)

## 2017-07-25 LAB — BLOOD GAS, ARTERIAL
ACID-BASE DEFICIT: 17.1 mmol/L — AB (ref 0.0–2.0)
Acid-base deficit: 10.9 mmol/L — ABNORMAL HIGH (ref 0.0–2.0)
BICARBONATE: 9 mmol/L — AB (ref 20.0–28.0)
Bicarbonate: 13.2 mmol/L — ABNORMAL LOW (ref 20.0–28.0)
DRAWN BY: 308601
O2 CONTENT: 3 L/min
O2 Content: 3 L/min
O2 SAT: 96.3 %
O2 SAT: 93.5 %
PATIENT TEMPERATURE: 97.6
PATIENT TEMPERATURE: 95.2
PCO2 ART: 22.4 mmHg — AB (ref 32.0–48.0)
pCO2 arterial: 22.2 mmHg — ABNORMAL LOW (ref 32.0–48.0)
pH, Arterial: 7.229 — ABNORMAL LOW (ref 7.350–7.450)
pH, Arterial: 7.377 (ref 7.350–7.450)
pO2, Arterial: 92.5 mmHg (ref 83.0–108.0)
pO2, Arterial: 60.1 mmHg — ABNORMAL LOW (ref 83.0–108.0)

## 2017-07-25 LAB — LIPID PANEL
Cholesterol: 74 mg/dL (ref 0–200)
HDL: 30 mg/dL — ABNORMAL LOW (ref >40)
LDL Cholesterol: 24 mg/dL (ref 0–99)
Total CHOL/HDL Ratio: 2.5 RATIO
Triglycerides: 102 mg/dL (ref <150)
VLDL: 20 mg/dL (ref 0–40)

## 2017-07-25 LAB — ECHOCARDIOGRAM COMPLETE
Height: 64 in
Weight: 2992 oz

## 2017-07-25 LAB — CORTISOL-PM, BLOOD: CORTISOL - PM: 24.6 ug/dL — AB (ref <10.0)

## 2017-07-25 LAB — CBC
HCT: 29.4 % — ABNORMAL LOW (ref 36.0–46.0)
Hemoglobin: 8.9 g/dL — ABNORMAL LOW (ref 12.0–15.0)
MCH: 28.3 pg (ref 26.0–34.0)
MCHC: 30.3 g/dL (ref 30.0–36.0)
MCV: 93.6 fL (ref 78.0–100.0)
Platelets: 189 10*3/uL (ref 150–400)
RBC: 3.14 MIL/uL — ABNORMAL LOW (ref 3.87–5.11)
RDW: 21.9 % — ABNORMAL HIGH (ref 11.5–15.5)
WBC: 17.7 10*3/uL — ABNORMAL HIGH (ref 4.0–10.5)

## 2017-07-25 LAB — HEMOGLOBIN A1C
Hgb A1c MFr Bld: 5.1 % (ref 4.8–5.6)
Mean Plasma Glucose: 99.67 mg/dL

## 2017-07-25 LAB — SODIUM, URINE, RANDOM: SODIUM UR: 119 mmol/L

## 2017-07-25 LAB — T4, FREE: FREE T4: 0.78 ng/dL (ref 0.61–1.12)

## 2017-07-25 LAB — CREATININE, URINE, RANDOM: Creatinine, Urine: 36.87 mg/dL

## 2017-07-25 MED ORDER — SODIUM BICARBONATE 8.4 % IV SOLN
INTRAVENOUS | Status: DC
  Administered 2017-07-25 – 2017-07-27 (×3): via INTRAVENOUS
  Filled 2017-07-25 (×4): qty 150

## 2017-07-25 MED ORDER — FENTANYL CITRATE (PF) 100 MCG/2ML IJ SOLN
INTRAMUSCULAR | Status: AC
Start: 2017-07-25 — End: 2017-07-25
  Filled 2017-07-25: qty 2

## 2017-07-25 MED ORDER — NOREPINEPHRINE BITARTRATE 1 MG/ML IV SOLN
0.0000 ug/min | INTRAVENOUS | Status: DC
  Administered 2017-07-25: 17 ug/min via INTRAVENOUS
  Administered 2017-07-26 – 2017-07-27 (×2): 10 ug/min via INTRAVENOUS
  Administered 2017-07-28: 14 ug/min via INTRAVENOUS
  Filled 2017-07-25 (×4): qty 16

## 2017-07-25 MED ORDER — FENTANYL CITRATE (PF) 100 MCG/2ML IJ SOLN
25.0000 ug | Freq: Once | INTRAMUSCULAR | Status: AC
  Administered 2017-07-25: 25 ug via INTRAVENOUS

## 2017-07-25 MED ORDER — NOREPINEPHRINE BITARTRATE 1 MG/ML IV SOLN
0.0000 ug/min | INTRAVENOUS | Status: DC
  Administered 2017-07-25: 2 ug/min via INTRAVENOUS
  Filled 2017-07-25 (×2): qty 4

## 2017-07-25 MED ORDER — LACTATED RINGERS IV BOLUS (SEPSIS)
1000.0000 mL | Freq: Once | INTRAVENOUS | Status: AC
  Administered 2017-07-25: 1000 mL via INTRAVENOUS

## 2017-07-25 MED ORDER — DEXTROSE 5 % IV SOLN
INTRAVENOUS | Status: DC
  Administered 2017-07-25: 01:00:00 via INTRAVENOUS

## 2017-07-25 MED ORDER — LACTATED RINGERS IV BOLUS (SEPSIS): 500.0000 mL | Freq: Once | INTRAVENOUS | Status: DC

## 2017-07-25 MED ORDER — VANCOMYCIN HCL IN DEXTROSE 1-5 GM/200ML-% IV SOLN
1000.0000 mg | Freq: Once | INTRAVENOUS | Status: AC
  Administered 2017-07-25: 1000 mg via INTRAVENOUS
  Filled 2017-07-25: qty 200

## 2017-07-25 MED ORDER — ALBUMIN HUMAN 5 % IV SOLN
12.5000 g | Freq: Once | INTRAVENOUS | Status: DC
  Filled 2017-07-25: qty 250

## 2017-07-25 MED ORDER — PANTOPRAZOLE SODIUM 40 MG IV SOLR
40.0000 mg | Freq: Every day | INTRAVENOUS | Status: DC
  Administered 2017-07-25 – 2017-07-29 (×5): 40 mg via INTRAVENOUS
  Filled 2017-07-25 (×6): qty 40

## 2017-07-25 MED ORDER — DEXTROSE 5 % IV SOLN
INTRAVENOUS | Status: DC
  Administered 2017-07-25 (×3): via INTRAVENOUS
  Filled 2017-07-25 (×2): qty 150

## 2017-07-25 NOTE — Progress Notes (Signed)
PCCM consulted, by [facility] NP. Orders received for low dose Levophed. PCCM NP to consult. Will continue to monitor.

## 2017-07-25 NOTE — Progress Notes (Signed)
eLink Physician-Brief Progress Note Patient Name: Katie Evans DOB: 07-06-1946 MRN: [id number]   Date of Service  07/25/2017  HPI/Events of Note  Notified by bedside nurse of repeat electrolyte panel. Sodium down to 146. Bicarbonate up to 17. Glucose slightly improved down to 297. Renal function slowly improving as well. Currently on bicarbonate drip.   eICU Interventions  1. Changing bicarbonate drip rate to 100 mL per hour 2. Repeat basic metabolic panel at 11 PM      Intervention Category Major Interventions: Acid-Base disturbance - evaluation and management;Other:  Lawanda CousinsJennings Li Bobo 07/25/2017, 7:00 PM

## 2017-07-25 NOTE — Progress Notes (Signed)
Initial Nutrition Assessment  DOCUMENTATION CODES:   Obesity unspecified  INTERVENTION:  - Diet advancement as medically feasible. - Will provide appropriate nutrition-related interventions at follow-up.   NUTRITION DIAGNOSIS:   Inadequate oral intake related to inability to eat as evidenced by NPO status.  GOAL:   Patient will meet greater than or equal to 90% of their needs  MONITOR:   Diet advancement, Weight trends, Labs, Skin  REASON FOR ASSESSMENT:   Malnutrition Screening Tool  ASSESSMENT:   71 year old female with PMH as below, which is significant for parkinson's, CHF, COPD, GERD, HTN, Hyperthyroid on methimazole, and Xarelto for PE. She has a long history of prescription drug abuse, overdoses, and non-compliance with medical therapies, which have ultimately lead to what is essentially failure to thrive. She resides in SNF where she was noted to have altered mental status for about 1 month by her sister. In June she was able to get around the SNF and use her cell phone and tablet. Daughter feels that something abrupt happened and that all changed. This was progressive until 8/29 when the patient's daughter demanded that she be sent to the ER where she was felt to be extremely dehydrated. She was provided with aggressive IVF resuscitation, and admitted to the hospitalist team for renal failure, encephalopathy, and possible UTI. CT scan of head concerning for CVA as well. She became hypotensive, which persisted despite IVF resuscitation and was transferred to ICU for pressor support.   Pt seen for MST. BMI indicates obesity. Pt has been NPO since admission and RN reports inability to have SLP evaluation d/t presentation of symptoms of stroke. She also reports that pt seems to have sores in her mouth, but that she is unable to take a closer look d/t pt clenching jaw when RN attempting to visualize oral cavity. No family/visitors present at this time and pt unable to provide  information. PCCM NP note from today states, "doubt family would agree to tube feeds if not sure to be temporary."   Physical assessment limited d/t Bair Hugger in place; mild/moderate muscle wasting to temple and no other muscle and no fat wasting noted to areas assessed. Per chart review, pt has lost 27 lbs (12% body weight) in the past 4 months which is significant for time frame, but noted that weight has been recorded as exactly the same (187 lbs/84.8 kg) on 5/18, 5/22, 5/29, 5/31, and 8/29. Will monitor weight trends closely during hospitalization. Unable to state malnutrition with limited information available at this time.   Medications reviewed; 10 units Novolog x1 dose yesterday, 40 mg IV Protonix/day, 50 mEq IV sodium bicarb x1 dose yesterday, 30 g oral Kayexalate x1 dose yesterday, SMOG enema x1 yesterday. Labs reviewed; Na: 152 mmol/L (trending down), Cl: >130 mmol/L, BUN: 81 mg/dL, creatinine: 1.61 mg/dL, GFR: 7 mL/min.   IVF: D5-150 mEq sodium bicarb @ 150 mL/hr (612 kcal from dextrose).    Diet Order:  Diet NPO time specified  Skin:  Wound (see comment) (Stage 2 sacral and bilateral upper thighs pressure injuries)  Last BM:  8/29  Height:   Ht Readings from Last 1 Encounters:  07/24/17 5\' 4"  (1.626 m)    Weight:   Wt Readings from Last 1 Encounters:  07/24/17 187 lb (84.8 kg)    Ideal Body Weight:  54.54 kg  BMI:  Body mass index is 32.1 kg/m.  Estimated Nutritional Needs:   Kcal:  1950-2120 (23-25 kcal/kg)  Protein:  95-105 grams  Fluid:  >/=  2.3 L/day  EDUCATION NEEDS:   No education needs identified at this time    Trenton GammonJessica Armine Rizzolo, MS, RD, LDN, [facility] Inpatient Clinical Dietitian Pager # [phone] After hours/weekend pager # [phone]

## 2017-07-25 NOTE — Progress Notes (Signed)
OT Cancellation Note  Patient Details Name: Katie Evans MRN: [id number] DOB: [birth year]   Cancelled Treatment:    Reason Eval/Treat Not Completed: Medical issues which prohibited therapy.  Pt is not ready for OT at this time.  [facility] Unsure if she was a LTC resident or there for rehab.  Will check back  Conal Shetley 07/25/2017, 2:31 PM  Marica OtterMaryellen Sariya Trickey, OTR/L [phone] 07/25/2017

## 2017-07-25 NOTE — Progress Notes (Signed)
PULMONARY / CRITICAL CARE MEDICINE   Name: Katie Evans MRN: [id number] DOB: 02-07-1946    ADMISSION DATE:  07/24/2017 CONSULTATION DATE:  8/30  REFERRING MD:  Dr. David Stall  CHIEF COMPLAINT:  shock  HISTORY OF PRESENT ILLNESS:   71 year old female with PMH as below, which is significant for parkinson's, CHF, COPD, GERD, HTN, Hyperthyroid on methimazole, and Xarelto for PE. She has a long history of prescription drug abuse, overdoses, and non-compliance with medical therapies, which have ultimately lead to what is essentially failure to thrive. She resides in SNF where she was noted to have altered mental status for about 1 month by her sister. In June she was able to get around the SNF and use her cell phone and tablet. Daughter feels that something abrupt happened and that all changed. This was progressive until 8/29 when the patient's daughter demanded that she be sent to the ER where she was felt to be extremely dehydrated. She was provided with aggressive IVF resuscitation, and admitted to the hospitalist team for renal failure, encephalopathy, and possible UTI. CT scan of head concerning for CVA as well. She became hypotensive, which persisted despite IVF resuscitation and was transferred to ICU for pressor support. PCCM consulted.      SUBJECTIVE: Ill appearing female now hypothermic and hypotensive   VITAL SIGNS: BP (!) 238/207 (BP Location: Left Arm)   Pulse (!) 47   Temp (!) 94.5 F (34.7 C) (Rectal)   Resp 16   Ht 5\' 4"  (1.626 m)   Wt 187 lb (84.8 kg)   SpO2 100%   BMI 32.10 kg/m   HEMODYNAMICS: CVP:  [1 mmHg-9 mmHg] 8 mmHg  VENTILATOR SETTINGS:    INTAKE / OUTPUT: I/O last 3 completed shifts: In: 6453.9 [I.V.:3343.9; Other:10; IV Piggyback:3100] Out: 20 [Urine:20]  PHYSICAL EXAMINATION: General:  Obese female with neck contracture, ill apperaing HEENT: MM pink/moist OZH:YQMV affeect Neuro: groans to stimulus  CV:HSD PULM:shallow respirations with  decreased bs bases HQ:IONG, non-tender, bsx4 active  Extremities: warm/dry, - edema  Skin: no rashes or lesions    LABS:  BMET  Recent Labs Lab 07/24/17 2227 07/25/17 0205 07/25/17 0630  NA 157* 154* 152*  K 5.4* 4.8 4.7  CL >130* >130* >130*  CO2 9* 10* 10*  BUN 88* 84* 81*  CREATININE 5.95* 5.53* 5.57*  GLUCOSE 86 134* 194*    Electrolytes  Recent Labs Lab 07/24/17 1648 07/24/17 2227 07/25/17 0205 07/25/17 0630  CALCIUM 11.4* 10.0 9.5 9.6  MG 2.1  --   --   --   PHOS 4.2  --   --   --     CBC  Recent Labs Lab 07/24/17 1057 07/24/17 1648 07/25/17 0500  WBC 27.3* 19.5* 17.7*  HGB 10.4* 10.7* 8.9*  HCT 34.4* 36.8 29.4*  PLT 176 187 189    Coag's  Recent Labs Lab 07/24/17 1057  INR 2.13    Sepsis Markers  Recent Labs Lab 07/24/17 1117  LATICACIDVEN 1.73    ABG  Recent Labs Lab 07/25/17 0400  PHART 7.229*  PCO2ART 22.2*  PO2ART 92.5    Liver Enzymes  Recent Labs Lab 07/24/17 1057  AST 19  ALT 11*  ALKPHOS 115  BILITOT 1.0  ALBUMIN 2.5*    Cardiac Enzymes  Recent Labs Lab 07/24/17 1057  TROPONINI 0.08*    Glucose No results for input(s): GLUCAP in the last 168 hours.  Imaging Ct Abdomen Pelvis Wo Contrast  Result Date: 07/24/2017 CLINICAL DATA:  Abdominal pain. EXAM: CT ABDOMEN AND PELVIS WITHOUT CONTRAST TECHNIQUE: Multidetector CT imaging of the abdomen and pelvis was performed following the standard protocol without IV contrast. COMPARISON:  None. FINDINGS: Lower chest: Subsegmental atelectasis in the left lower lobe. Patchy centrilobular nodules in the left lower lobe. Heart is normal in size. Hepatobiliary: No focal liver abnormality. Layering stones or sludge in the gallbladder. No biliary dilatation. Pancreas: Pancreatic atrophy.  No ductal dilatation. Spleen: Normal in size without focal abnormality. Adrenals/Urinary Tract: The adrenal glands are unremarkable. No renal calculi or hydronephrosis. The bladder is  decompressed by a Foley catheter. 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 No bowel wall thickening, distention, or surrounding inflammatory changes. Large stool ball in the rectum. Scattered left-sided colonic diverticulosis. Vascular/Lymphatic: Aortic atherosclerosis. No enlarged abdominal or pelvic lymph nodes. Reproductive: Status post hysterectomy. No adnexal masses. Other: Small fat containing umbilical hernia.  No free fluid. Musculoskeletal: No acute or significant osseous findings. Severe multilevel degenerative changes of the lumbar spine with stepwise grade 1 retrolisthesis at L1-L2 and L2-L3. IMPRESSION: 1. No acute intra-abdominal process. 2. Large stool ball in the rectum.  Correlate for fecal impaction. 3. Small amount of patchy centrilobular nodularity in the left lower lobe, which could reflect aspiration. 4.  Aortic atherosclerosis (ICD10-I70.0). Electronically Signed   By: Obie Dredge M.D.   On: 07/24/2017 13:16   Ct Head Wo Contrast  Result Date: 07/24/2017 CLINICAL DATA:  Altered mental status for a month.  Nonverbal.  Hip EXAM: CT HEAD WITHOUT CONTRAST TECHNIQUE: Contiguous axial images were obtained from the base of the skull through the vertex without intravenous contrast. COMPARISON:  11/25/2014 FINDINGS: Brain: Moderate area of poor gray-white differentiation in the anterior and lateral left frontal lobe. The subjacent white matter is also more low density than the right. Certainty of this finding is diminished by asymmetric patient positioning and streak artifact. No hemorrhage, hydrocephalus, or masslike findings. There is periventricular chronic microvascular ischemic change that is mild. Mild cerebral volume loss. Vascular: No hyperdense vessel.  Atherosclerotic calcifications. Skull: Negative Sinuses/Orbits: Chronic left maxillary sinusitis with near complete opacification of the visualized sinus, with sclerotic wall thickening.  Same findings were seen on 2015 comparison. Other: Case discussed with Dr. Juleen China. IMPRESSION: 1. Possible moderate acute infarct in the lateral left frontal lobe. Certainty is diminished by artifact from patient positioning. 2. Chronic microvascular ischemia. 3. Chronic left maxillary sinusitis. Electronically Signed   By: Marnee Spring M.D.   On: 07/24/2017 11:48   Dg Chest Port 1 View  Result Date: 07/25/2017 CLINICAL DATA:  Dyspnea.  Central line placement. EXAM: PORTABLE CHEST 1 VIEW COMPARISON:  07/24/2017 FINDINGS: There is a new right jugular central line, terminating in the low SVC. There is no pneumothorax. Left central and basilar lung consolidation, new. Right lung is clear. Pulmonary vasculature is normal. IMPRESSION: 1. Satisfactorily positioned right jugular central line. No pneumothorax. 2. New left base consolidation.  This may represent pneumonia. Electronically Signed   By: Ellery Plunk M.D.   On: 07/25/2017 05:49   Dg Chest Portable 1 View  Result Date: 07/24/2017 CLINICAL DATA:  Hypoxemia.  Altered mental status. EXAM: PORTABLE CHEST 1 VIEW COMPARISON:  05/10/2015 FINDINGS: The heart size and mediastinal contours are within normal limits. Both lungs are clear. Left shoulder prosthesis again noted. IMPRESSION: No active disease. Electronically Signed   By: Myles Rosenthal M.D.   On: 07/24/2017 11:27   Dg Knee Complete 4 Views Right  Result Date:  07/24/2017 CLINICAL DATA:  Right knee pain.  Altered mental status. EXAM: RIGHT KNEE - COMPLETE 4+ VIEW COMPARISON:  None. FINDINGS: Right knee prosthesis in expected position. No evidence of hardware failure or loosening. Generalized osteopenia noted. No evidence of fracture or dislocation. No evidence of knee joint effusion. IMPRESSION: No acute findings. Electronically Signed   By: Myles RosenthalJohn  Stahl M.D.   On: 07/24/2017 11:29     STUDIES:  CT head 8/29 > Possible moderate acute infarct in the lateral left frontal lobe. Certainty is  diminished by artifact from patient positioning. Chronic microvascular ischemia. Chronic left maxillary sinusitis. CT abd 8/29 > No acute intra-abdominal process. Large stool ball in the rectum.  Correlate for fecal impaction. Small amount of patchy centrilobular nodularity in the left lower lobe, which could reflect aspiration.  CULTURES: Blood 8/29 > Urine 8/30 >  ANTIBIOTICS: Zosyn 8/29 > Vanco 8/30 >  SIGNIFICANT EVENTS: 8/29 admit 8/30 tx to ICU for pressors.   LINES/TUBES: RIJ CVL 8/30 >  DISCUSSION: 71 year old female who lives in SNF after life long non-compliance and prescription medication abuse lead essentially to failure to thrive. Progressively worsening mental status since June per sister. Admitted 8/29 with AMS and renal failure. CT head concerning for stoke of unclear acuity. Renal failure and hypotension refractory to IVF resuscitation. Required ICU transfer and pressors.   ASSESSMENT / PLAN:  PULMONARY A: Risk poor airway protection  P:   Monitor DNI  CARDIOVASCULAR A:  Shock, likely septic, cvp 8 Chronic systolic/diastolic CHF Mild troponin elevation, likely demand H/o HTN  P:  Telemetry  Levophed for MAP goal > 65mmHg CVL place, check CVP->5 S/p 5 L NS resuscitation Echo Maintenance fluids as below  Will give some albumin, appears to be third spacing.  Lactic cleared Trend trop DNR if arrests  RENAL A:   AKI Hyperchloremia Hypernatremia Mixed metabolic acidosis Lab Results  Component Value Date   CREATININE 5.57 (H) 07/25/2017   CREATININE 5.53 (H) 07/25/2017   CREATININE 5.95 (H) 07/24/2017    Recent Labs Lab 07/24/17 2227 07/25/17 0205 07/25/17 0630  NA 157* 154* 152*    Recent Labs Lab 07/24/17 2227 07/25/17 0205 07/25/17 0630  K 5.4* 4.8 4.7     P:   Change IVF to sodium bicarb  Sodium dropping at safe rate Family feels as though she is not a candidate for long term dialysis or maybe even dialysis at all,  should that need arise.  Bicarb drip   GASTROINTESTINAL A:   GERD  P:   NPO Protonix for SUP Doubt family would agree to tube feeds if not sure to be temporary  HEMATOLOGIC  Recent Labs  07/24/17 1648 07/25/17 0500  HGB 10.7* 8.9*    A:   Anemia hgb at baseline 10-12  P:  Trend CBC Sq enoxaparin for VTE ppx   INFECTIOUS A:   Septic shock? Urosepsis likely source 8/30 remains on levo drip  P:   Continue Zosyn Will add vanco with SNF exposure, low threshold to DC with to renal failure.  Follow cultures  ENDOCRINE A:   Hyperthyroid   TSH wnl  P:   Monitor glucose on chem panel Holding methimazole  NEUROLOGIC A:   Acute encephalopathy metabolic component, may have subacute CVA as well. Sister states worse ing over several months.  Possible CVA  P:   Close monitoring MRI pending when stable enough to go to scanner  FAMILY  - Updates: sister [facility](HCPOA Katie HalonDana Evans) updated at  length 0400 8/30. She is very realistic and feels as though her sisters quality of life has not been good for some time. She feels as though nursing home neglected to care appropriately for her sister and wants to be conservative with care moving forward. Ok with aggressive medical therapies, but is very wary of treatments that would negatively effect quality of life like dialysis or tube feeds.   - Inter-disciplinary family meet or Palliative Care meeting due by:  9/5    App CCT 30 min   Brett Canales Minor ACNP [facility] Pollack PCCM Pager [phone] till 3 pm If no answer page [phone] 07/25/2017, 9:49 AM    Attending note: I have seen and examined the patient with nurse practitioner/resident and agree with the note. History, labs and imaging reviewed.  Mental status continues to be poor. She is maintained on Levophed.  Blood pressure (!) 238/207, pulse (!) 57, temperature (!) 95.5 F (35.3 C), temperature source Rectal, resp. rate 18, height 5\' 4"  (1.626 m), weight 187 lb (84.8 kg), SpO2 99  %. Gen:      No acute distress HEENT:  EOMI, sclera anicteric Neck:     No masses; no thyromegaly Lungs:    Clear to auscultation bilaterally; normal respiratory effort CV:         Regular rate and rhythm; no murmurs Abd:      + bowel sounds; soft, non-tender; no palpable masses, no distension Ext:    No edema; adequate peripheral perfusion Skin:      Warm and dry; no rash Neuro: alert and oriented x 3 Psych: normal mood and affect  Lab significant for ABG 7.38/22/60/95% BUN/creatinine 75/5, sodium 147 WBC 17 point 7, hemoglobin 8.9, platelets 1 8 9  CT abdomen pelvis 8/29- no acute intra-abdominal process, mild aspiration in the lung images, fecal impaction Chest x-ray 8/30-right IJ, left lung consolidation. I have reviewed all images personally.  Assessment/plan: 71 year old nursing home resident admitted with septic shock secondary to age, UTI Acute renal failure, hyponatremia, metabolic acidosis  - Continue Levophed, broad antibiotics - Continue IV fluids. - CT head concerning for stroke of unclear chronicity. Awaiting MRI head when she is stable - Code status is DNR. Confirmed with the sister at bedside.      The patient is critically ill with multiple organ systems failure and requires high complexity decision making for assessment and support, frequent evaluation and titration of therapies, application of advanced monitoring technologies and extensive interpretation of multiple databases.  Critical care time - 35 mins. This represents my time independent of the NPs time taking care of the pt.  Chilton Greathouse MD [facility] Pager [phone] If no answer or after 3pm call: [phone] 07/25/2017, 5:17 PM

## 2017-07-25 NOTE — Procedures (Signed)
Central Venous Catheter Insertion Procedure Note Katie Evans [id number] 1945/12/24  Procedure: Insertion of Central Venous Catheter Indications: Drug and/or fluid administration  Procedure Details Consent: Risks of procedure as well as the alternatives and risks of each were explained to the (patient/caregiver).  Consent for procedure obtained. Time Out: Verified patient identification, verified procedure, site/side was marked, verified correct patient position, special equipment/implants available, medications/allergies/relevent history reviewed, required imaging and test results available.  Performed  Maximum sterile technique was used including antiseptics, cap, gloves, gown, hand hygiene, mask and sheet. Skin prep: Chlorhexidine; local anesthetic administered A antimicrobial bonded/coated triple lumen catheter was placed in the right internal jugular vein using the Seldinger technique. Ultrasound guidance used.Yes.   Catheter placed to 15 cm. Blood aspirated via all 3 ports and then flushed x 3. Line sutured x 2 and dressing applied.  Evaluation Blood flow good Complications: No apparent complications Patient did tolerate procedure well. Chest X-ray ordered to verify placement.  CXR: pending.  Katie Evans, AGACNP-BC [facility] Pager [phone] or [phone]  07/25/2017 5:16 AM

## 2017-07-25 NOTE — Progress Notes (Signed)
  Echocardiogram 2D Echocardiogram has been performed.  Katie PartridgeBrooke S Kambria Grima 07/25/2017, 10:19 AM

## 2017-07-25 NOTE — Progress Notes (Signed)
CRITICAL VALUE ALERT  Critical Value:  Cl > 130  Date & Time Notied:  07/25/17 0247  Provider Notified: Merdis DelayK. Schorr, NP  Orders Received/Actions taken: Continue to monitor.

## 2017-07-25 NOTE — Consult Note (Signed)
PULMONARY / CRITICAL CARE MEDICINE   Name: Katie Evans MRN: [id number] DOB: Oct 15, 1946    ADMISSION DATE:  07/24/2017 CONSULTATION DATE:  8/30  REFERRING MD:  Dr. David Stall  CHIEF COMPLAINT:  shock  HISTORY OF PRESENT ILLNESS:   71 year old female with PMH as below, which is significant for parkinson's, CHF, COPD, GERD, HTN, Hyperthyroid on methimazole, and Xarelto for PE. She has a long history of prescription drug abuse, overdoses, and non-compliance with medical therapies, which have ultimately lead to what is essentially failure to thrive. She resides in SNF where she was noted to have altered mental status for about 1 month by her sister. In June she was able to get around the SNF and use her cell phone and tablet. Daughter feels that something abrupt happened and that all changed. This was progressive until 8/29 when the patient's daughter demanded that she be sent to the ER where she was felt to be extremely dehydrated. She was provided with aggressive IVF resuscitation, and admitted to the hospitalist team for renal failure, encephalopathy, and possible UTI. CT scan of head concerning for CVA as well. She became hypotensive, which persisted despite IVF resuscitation and was transferred to ICU for pressor support. PCCM consulted.   PAST MEDICAL HISTORY :  She  has a past medical history of Allergic rhinitis; Anemia; Anxiety; Arthritis; Bruising; Carpal tunnel syndrome; CHF (congestive heart failure) (HCC); Chronic diarrhea; Chronic pulmonary embolism (HCC); Demand ischemia of myocardium (HCC); Flatulence; Generalized anxiety disorder; GERD without esophagitis; Hemorrhoids; Hyperlipidemia; Hypertension; Hyperthyroidism; Hypokalemia; Long term current use of anticoagulant therapy; Major depressive disorder; Metabolic encephalopathy; Overactive bladder; Protein calorie malnutrition (HCC); Psychosis; Renal disorder; RLS (restless legs syndrome); Thrombocytopathia (HCC); Thrombocytopenia  (HCC); and Vaginal atrophy.  PAST SURGICAL HISTORY: She  has a past surgical history that includes Tonsillectomy; Appendectomy; Back surgery; Gastric bypass; Shoulder surgery; Abdominal hysterectomy; and Spine surgery.  Allergies  Allergen Reactions  . Remeron [Mirtazapine]     Per MAR. No rxn listed    No current facility-administered medications on file prior to encounter.    Current Outpatient Prescriptions on File Prior to Encounter  Medication Sig  . atorvastatin (LIPITOR) 20 MG tablet Take 20 mg by mouth daily.  . Cholecalciferol (D3-1000 PO) Take 1,000 Units by mouth daily.   . cyclobenzaprine (FLEXERIL) 5 MG tablet Take 5 mg by mouth 3 (three) times daily as needed for muscle spasms.  . famotidine (PEPCID) 20 MG tablet Take 20 mg by mouth daily.   Marland Kitchen HYDROcodone-acetaminophen (NORCO/VICODIN) 5-325 MG tablet Take 1-2 tablets by mouth every 4 (four) hours as needed for moderate pain.  Marland Kitchen loperamide (IMODIUM) 2 MG capsule Take 2 mg by mouth 4 (four) times daily as needed for diarrhea or loose stools.   . Menthol, Topical Analgesic, (ICY HOT EX) Apply 1 application topically. Apply 7.5% cream to both shoulders at 9AM, 3PM, 9PM.  Hold for skin irritation.  . methimazole (TAPAZOLE) 5 MG tablet Take 1 tablet (5 mg total) by mouth daily.  . nadolol (CORGARD) 40 MG tablet Take 40 mg by mouth daily.   . nitroGLYCERIN (NITROSTAT) 0.4 MG SL tablet Place 0.4 mg under the tongue every 5 (five) minutes as needed for chest pain. For 3 doses as needed for chest pain  . oxybutynin (DITROPAN) 5 MG tablet Take 5 mg by mouth daily.   . potassium chloride SA (K-DUR,KLOR-CON) 20 MEQ tablet Take 20 mEq by mouth 3 (three) times daily. Take with food.  Do not crush.  May dissolve.  . pramipexole (MIRAPEX) 1.5 MG tablet Take 1.5 mg by mouth 2 (two) times daily.   . sertraline (ZOLOFT) 100 MG tablet Take 200 mg by mouth daily. Take two 100 mg tablets to = 200 mg po QD for depression  . shark liver oil-cocoa  butter (PREPARATION H) 0.25-3-85.5 % suppository Place 1 suppository rectally 2 (two) times daily as needed for hemorrhoids.  . topiramate (TOPAMAX) 50 MG tablet Take 50 mg by mouth 2 (two) times daily.   Marland Kitchen. torsemide (DEMADEX) 20 MG tablet Take 20 mg by mouth daily.     FAMILY HISTORY:  Her indicated that the status of her neg hx is unknown.    SOCIAL HISTORY: She  reports that she has never smoked. She has never used smokeless tobacco. She reports that she does not drink alcohol or use drugs.  REVIEW OF SYSTEMS:   Unable as patient is encephalopathic  SUBJECTIVE:    VITAL SIGNS: BP (!) 238/207 (BP Location: Left Arm)   Pulse (!) 54   Temp 97.6 F (36.4 C) (Axillary)   Resp 20   Ht 5\' 4"  (1.626 m)   Wt 84.8 kg (187 lb)   SpO2 98%   BMI 32.10 kg/m   HEMODYNAMICS:    VENTILATOR SETTINGS:    INTAKE / OUTPUT: I/O last 3 completed shifts: In: 2260 [I.V.:200; Other:10; IV Piggyback:2050] Out: -   PHYSICAL EXAMINATION: General:  Overweight female in NAD Neuro:  Eyes open spont, but no verbal response, does not follow commands. Does track with eyes.  HEENT:  [place]/AT, PERRL, no JVD Cardiovascular:  RRR, no MRG Lungs:  Clear Abdomen:  Soft, non-distended, non-tender Musculoskeletal:  No acute deformity Skin:  Perineal and sacral areas red with blisters.   LABS:  BMET  Recent Labs Lab 07/24/17 1648 07/24/17 2227 07/25/17 0205  NA 161* 157* 154*  K 6.0* 5.4* 4.8  CL >130* >130* >130*  CO2 14* 9* 10*  BUN 98* 88* 84*  CREATININE 6.93*  6.90* 5.95* 5.53*  GLUCOSE 65 86 134*    Electrolytes  Recent Labs Lab 07/24/17 1648 07/24/17 2227 07/25/17 0205  CALCIUM 11.4* 10.0 9.5  MG 2.1  --   --   PHOS 4.2  --   --     CBC  Recent Labs Lab 07/24/17 1057 07/24/17 1648  WBC 27.3* 19.5*  HGB 10.4* 10.7*  HCT 34.4* 36.8  PLT 176 187    Coag's  Recent Labs Lab 07/24/17 1057  INR 2.13    Sepsis Markers  Recent Labs Lab 07/24/17 1117   LATICACIDVEN 1.73    ABG No results for input(s): PHART, PCO2ART, PO2ART in the last 168 hours.  Liver Enzymes  Recent Labs Lab 07/24/17 1057  AST 19  ALT 11*  ALKPHOS 115  BILITOT 1.0  ALBUMIN 2.5*    Cardiac Enzymes  Recent Labs Lab 07/24/17 1057  TROPONINI 0.08*    Glucose No results for input(s): GLUCAP in the last 168 hours.  Imaging Ct Abdomen Pelvis Wo Contrast  Result Date: 07/24/2017 CLINICAL DATA:  Abdominal pain. EXAM: CT ABDOMEN AND PELVIS WITHOUT CONTRAST TECHNIQUE: Multidetector CT imaging of the abdomen and pelvis was performed following the standard protocol without IV contrast. COMPARISON:  None. FINDINGS: Lower chest: Subsegmental atelectasis in the left lower lobe. Patchy centrilobular nodules in the left lower lobe. Heart is normal in size. Hepatobiliary: No focal liver abnormality. Layering stones or sludge in the gallbladder. No biliary dilatation. Pancreas: Pancreatic atrophy.  No  ductal dilatation. Spleen: Normal in size without focal abnormality. Adrenals/Urinary Tract: The adrenal glands are unremarkable. No renal calculi or hydronephrosis. The bladder is decompressed by a Foley catheter. 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 No bowel wall thickening, distention, or surrounding inflammatory changes. Large stool ball in the rectum. Scattered left-sided colonic diverticulosis. Vascular/Lymphatic: Aortic atherosclerosis. No enlarged abdominal or pelvic lymph nodes. Reproductive: Status post hysterectomy. No adnexal masses. Other: Small fat containing umbilical hernia.  No free fluid. Musculoskeletal: No acute or significant osseous findings. Severe multilevel degenerative changes of the lumbar spine with stepwise grade 1 retrolisthesis at L1-L2 and L2-L3. IMPRESSION: 1. No acute intra-abdominal process. 2. Large stool ball in the rectum.  Correlate for fecal impaction. 3. Small amount of patchy centrilobular  nodularity in the left lower lobe, which could reflect aspiration. 4.  Aortic atherosclerosis (ICD10-I70.0). Electronically Signed   By: Obie Dredge M.D.   On: 07/24/2017 13:16   Ct Head Wo Contrast  Result Date: 07/24/2017 CLINICAL DATA:  Altered mental status for a month.  Nonverbal.  Hip EXAM: CT HEAD WITHOUT CONTRAST TECHNIQUE: Contiguous axial images were obtained from the base of the skull through the vertex without intravenous contrast. COMPARISON:  11/25/2014 FINDINGS: Brain: Moderate area of poor gray-white differentiation in the anterior and lateral left frontal lobe. The subjacent white matter is also more low density than the right. Certainty of this finding is diminished by asymmetric patient positioning and streak artifact. No hemorrhage, hydrocephalus, or masslike findings. There is periventricular chronic microvascular ischemic change that is mild. Mild cerebral volume loss. Vascular: No hyperdense vessel.  Atherosclerotic calcifications. Skull: Negative Sinuses/Orbits: Chronic left maxillary sinusitis with near complete opacification of the visualized sinus, with sclerotic wall thickening. Same findings were seen on 2015 comparison. Other: Case discussed with Dr. Juleen China. IMPRESSION: 1. Possible moderate acute infarct in the lateral left frontal lobe. Certainty is diminished by artifact from patient positioning. 2. Chronic microvascular ischemia. 3. Chronic left maxillary sinusitis. Electronically Signed   By: Marnee Spring M.D.   On: 07/24/2017 11:48   Dg Chest Portable 1 View  Result Date: 07/24/2017 CLINICAL DATA:  Hypoxemia.  Altered mental status. EXAM: PORTABLE CHEST 1 VIEW COMPARISON:  05/10/2015 FINDINGS: The heart size and mediastinal contours are within normal limits. Both lungs are clear. Left shoulder prosthesis again noted. IMPRESSION: No active disease. Electronically Signed   By: Myles Rosenthal M.D.   On: 07/24/2017 11:27   Dg Knee Complete 4 Views Right  Result Date:  07/24/2017 CLINICAL DATA:  Right knee pain.  Altered mental status. EXAM: RIGHT KNEE - COMPLETE 4+ VIEW COMPARISON:  None. FINDINGS: Right knee prosthesis in expected position. No evidence of hardware failure or loosening. Generalized osteopenia noted. No evidence of fracture or dislocation. No evidence of knee joint effusion. IMPRESSION: No acute findings. Electronically Signed   By: Myles Rosenthal M.D.   On: 07/24/2017 11:29     STUDIES:  CT head 8/29 > Possible moderate acute infarct in the lateral left frontal lobe. Certainty is diminished by artifact from patient positioning. Chronic microvascular ischemia. Chronic left maxillary sinusitis. CT abd 8/29 > No acute intra-abdominal process. Large stool ball in the rectum.  Correlate for fecal impaction. Small amount of patchy centrilobular nodularity in the left lower lobe, which could reflect aspiration.  CULTURES: Blood 8/29 > Urine 8/30 >  ANTIBIOTICS: Zosyn 8/29 > Vanco 8/30 >  SIGNIFICANT EVENTS: 8/29 admit 8/30 tx to ICU for pressors.   LINES/TUBES:  RIJ CVL 8/30 >  DISCUSSION: 71 year old female who lives in SNF after life long non-compliance and prescription medication abuse lead essentially to failure to thrive. Progressively worsening mental status since June per sister. Admitted 8/29 with AMS and renal failure. CT head concerning for stoke of unclear acuity. Renal failure and hypotension refractory to IVF resuscitation. Required ICU transfer and pressors.   ASSESSMENT / PLAN:  PULMONARY A: Risk poor airway protection  P:   Monitor DNI  CARDIOVASCULAR A:  Shock, likely septic Chronic systolic/diastolic CHF Mild troponin elevation, likely demand H/o HTN  P:  Telemetry  Levophed for MAP goal > CVL place, check CVP S/p 5 L NS resuscitation Echo Maintenance fluids as below  Will give some albumin, appears to be third spacing.  Lactic cleared Trend trop DNR if arrests  RENAL A:    AKI Hyperchloremia Hypernatremia Mixed metabolic acidosis  P:   Change IVF to sodium bicarb  Sodium dropping at safe rate Family feels as though she is not a candidate for long term dialysis or maybe even dialysis at all, should that need arise.   GASTROINTESTINAL A:   GERD  P:   NPO Protonix for SUP Doubt family would agree to tube feeds if not sure to be temporary  HEMATOLOGIC A:   Anemia hgb at baseline 10-12  P:  Trend CBC Sq enoxaparin for VTE ppx   INFECTIOUS A:   Septic shock? Urosepsis likely source  P:   Continue Zosyn Will add vanco with SNF exposure, low threshold to DC with to renal failure.  Follow cultures  ENDOCRINE A:   Hyperthyroid   TSH wnl  P:   Assess t3, t4 Monitor glucose on chem panel Holding methimazole  NEUROLOGIC A:   Acute encephalopathy metabolic component, may have subacute CVA as well. Sister states worse ing over several months.  Possible CVA  P:   Close monitoring MRI pending  FAMILY  - Updates: sister [facility] Anabel Halon) updated at length 0400 8/30. She is very realistic and feels as though her sisters quality of life has not been good for some time. She feels as though nursing home neglected to care appropriately for her sister and wants to be conservative with care moving forward. Ok with aggressive medical therapies, but is very wary of treatments that would negatively effect quality of life like dialysis or tube feeds.   - Inter-disciplinary family meet or Palliative Care meeting due by:  9/5    Joneen Roach, AGACNP-BC [facility] Pager [phone] or [phone]  07/25/2017 5:31 AM

## 2017-07-25 NOTE — Progress Notes (Addendum)
Shift event note:  Notified several times since beginning of shift regarding persistent hypotension and no UOP since foley placed at approx 5-6 pm tonight. SBP's have ranged from 60's-80's w/ MAPs ranging from mid 40's to 58. Initially BP's were very labile so accuracy questioned. A-line placed and BP's then noted very consistent (SBP 70-80's) w/ few MAPS greater than 50-55. Pt currently receiving the last of 6-7 L IVF's (2-3 in ED and 4 since arrival to SDU) w/o significant improvement in BP. At bedside pt noted easily aroused but non-verbal. She appears to track briefly but otherwise does not follow commands. She does not appear in any acute distress. Skin cool, pale and dry. Abd is soft and w/o apparent TTP. + Hypoactive BS. BBS diminished but otherwise CTA. Pt remains afebrile, HR-50-60's. 02 sats 99-100% on 2L [place]). Current SBP 70's w/ MAP's of 47-51. Foley currently shows approx 10-15 cc cloudy yellow urine in the urimeter. Repeat Bmet from 2227 slightly improved w/ Na of 157 (from 161), K+ 5.4 (from 6.0) and creatinine of 5.95 (from 6.93). However, C02 9 (from 14).  Assessment/Plan: 1. Persistent hypotension: In setting of AKI, profound dehydration and hypernatremia (likely chronic). Refractory to significant fluid resuscitation (6-7L). MAP's remain 40-50's. No UOP. Mentation remains altered. All antihypertensive meds have been on hold. Discussed pt w/ Dr Sung AmabileSimonds w/ [facility] who has agreed to start low-dose Levophed until central access can be obtained (pt currently w/ one PIV to (R) AC). Will stop IVFB in progress per Dr Sung AmabileSimonds recommendation. Will start D5W at 100 cc/hr (slightly below the recommended 1.35 ml/kg/hr). Bmets q4h (next one due at 0200). PCCM provider to see pt later tonight and likely attempt to obtain central venous access. Will change pt's level of care to ICU as she is now on pressors. Will continue to monitor closely in ICU.  Appreciate PCCM input and management. Will follow.   Leanne ChangKatherine  P. Kenna Seward, NP-C [facility] Pager [phone]  CRITICAL CARE Performed by: Leanne ChangSCHORR, Chandria Rookstool P   Total critical care time: 120 minutes  Critical care time was exclusive of separately billable procedures and treating other patients.  Critical care was necessary to treat or prevent imminent or life-threatening deterioration.  Critical care was time spent personally by me on the following activities: development of treatment plan with patient and/or surrogate as well as nursing, discussions with consultants, evaluation of patient's response to treatment, examination of patient, obtaining history from patient or surrogate, ordering and performing treatments and interventions, ordering and review of laboratory studies, ordering and review of radiographic studies, pulse oximetry and re-evaluation of patient's condition.

## 2017-07-25 NOTE — Progress Notes (Signed)
PT Cancellation Note  Patient Details Name: Katie Evans MRN: [id number] DOB: 15-Mar-1946   Cancelled Treatment:    Reason Eval/Treat Not Completed: Patient not medically ready ((pt lethargic, requiring vasopressors, last BP 238/207 will continue efforts))   [facility] Surgery CenterWILLIAMS,Ivaan Liddy 07/25/2017, 9:24 AM

## 2017-07-25 NOTE — Progress Notes (Signed)
RN spoke with [facility] MD. RN can titrate Levophed up to 20 mcg. To maintain BP goals. Will continue to monitor.

## 2017-07-25 NOTE — Care Management Note (Signed)
Case Management Note  Patient Details  Name: Katie Evans MRN: [id number] Date of Birth: [birth year]  Subjective/Objective:   Sepsis and hypotension with aki                 Action/Plan: Date:  July 25, 2017 Chart reviewed for concurrent status and case management needs. Will continue to follow patient progress. Discharge Planning: following for needs Expected discharge date: [id number] Marcelle SmilingRhonda Davis, BSN, [place]RN3, [place]M   [phone]  Expected Discharge Date:   (UNKNOWN)               Expected Discharge Plan:  Skilled Nursing Facility  In-House Referral:  Clinical Social Work  Discharge planning Services  CM Consult  Post Acute Care Choice:    Choice offered to:     DME Arranged:    DME Agency:     HH Arranged:    HH Agency:     Status of Service:  In process, will continue to follow  If discussed at Long Length of Stay Meetings, dates discussed:    Additional Comments:  Golda AcreDavis, Rhonda Lynn, RN 07/25/2017, 9:07 AM

## 2017-07-25 NOTE — Progress Notes (Addendum)
**  Preliminary report by tech**  Carotid artery duplex complete. Findings are consistent with a 1-39 percent stenosis involving the left internal carotid artery by velocities. Monophasic flow was noted throughout the left carotid system. The left vertebral arteries demonstrate antegrade flow. Unable to clearly visualize arteries to accurately assess plaque morphology. Unable to visualize the right sided carotid arteries due to IJ line and bandages. Left side images were technically difficult to obtain due to patient positioning, cooperation, movement, vessel tortuosity, and acoustic shadowing.   07/25/17 8:54 AM Olen CordialGreg Keston Seever RVT

## 2017-07-25 NOTE — Progress Notes (Signed)
Pharmacy Antibiotic Note  Katie Evans is a 71 y.o. female admitted on 07/24/2017 with sepsis.  Pharmacy has been consulted for Vancomycin dosing.  Plan: Vancomycin 1gm IV x1 now.  Goal trough 15-20 mcg/mL.  Check level 48hr after dose if renal function does not improve.  Height: 5\' 4"  (162.6 cm) Weight: 187 lb (84.8 kg) IBW/kg (Calculated) : 54.7  Temp (24hrs), Avg:97.5 F (36.4 C), Min:97.3 F (36.3 C), Max:97.6 F (36.4 C)   Recent Labs Lab 07/24/17 1057 07/24/17 1117 07/24/17 1648 07/24/17 2227 07/25/17 0205  WBC 27.3*  --  19.5*  --   --   CREATININE 7.52*  --  6.93*  6.90* 5.95* 5.53*  LATICACIDVEN  --  1.73  --   --   --     Estimated Creatinine Clearance: 10.3 mL/min (A) (by C-G formula based on SCr of 5.53 mg/dL (H)).    Allergies  Allergen Reactions  . Remeron [Mirtazapine]     Per MAR. No rxn listed    Antimicrobials this admission: Vancomycin 07/25/2017 >> Zosyn 07/24/2017 >>   Dose adjustments this admission: -  Microbiology results: 8/29 BCx:   UCx: to be collected   Thank you for allowing pharmacy to be a part of this patient's care.  Aleene DavidsonGrimsley Jr, Faydra Korman Crowford 07/25/2017 5:42 AM

## 2017-07-25 NOTE — Evaluation (Signed)
SLP Cancellation Note  Patient Details Name: Katie Evans MRN: [id number] DOB: [birth year]   Cancelled treatment:       Reason Eval/Treat Not Completed: Medical issues which prohibited therapy (pt lethargic, requiring vasopressors, will continue efforts)   Chales AbrahamsKimball, Pratham Cassatt Ann 07/25/2017, 7:43 AM  Donavan Burnetamara Nawaal Alling, MS [facility] SLP [phone]

## 2017-07-26 ENCOUNTER — Inpatient Hospital Stay (HOSPITAL_COMMUNITY): Payer: Medicare Other

## 2017-07-26 ENCOUNTER — Inpatient Hospital Stay (HOSPITAL_COMMUNITY)
Admit: 2017-07-26 | Discharge: 2017-07-26 | Disposition: A | Discharge status: Home / Self Care | Payer: Medicare Other | Attending: Pulmonary Disease | Admitting: Pulmonary Disease

## 2017-07-26 DIAGNOSIS — R6521 Severe sepsis with septic shock: Secondary | ICD-10-CM

## 2017-07-26 DIAGNOSIS — A419 Sepsis, unspecified organism: Secondary | ICD-10-CM

## 2017-07-26 LAB — BLOOD CULTURE ID PANEL (REFLEXED)

## 2017-07-26 LAB — BASIC METABOLIC PANEL
Anion gap: 11 (ref 5–15)
BUN: 74 mg/dL — ABNORMAL HIGH (ref 6–20)
CALCIUM: 8.7 mg/dL — AB (ref 8.9–10.3)
CO2: 21 mmol/L — AB (ref 22–32)
CREATININE: 4.75 mg/dL — AB (ref 0.44–1.00)
Chloride: 115 mmol/L — ABNORMAL HIGH (ref 101–111)
GFR calc non Af Amer: 9 mL/min — ABNORMAL LOW (ref >60)
GFR, EST AFRICAN AMERICAN: 10 mL/min — AB (ref >60)
Glucose, Bld: 213 mg/dL — ABNORMAL HIGH (ref 65–99)
Potassium: 3.3 mmol/L — ABNORMAL LOW (ref 3.5–5.1)
Sodium: 147 mmol/L — ABNORMAL HIGH (ref 135–145)

## 2017-07-26 LAB — GLUCOSE, CAPILLARY
GLUCOSE-CAPILLARY: 133 mg/dL — AB (ref 65–99)
GLUCOSE-CAPILLARY: 170 mg/dL — AB (ref 65–99)
GLUCOSE-CAPILLARY: 204 mg/dL — AB (ref 65–99)
GLUCOSE-CAPILLARY: 225 mg/dL — AB (ref 65–99)
Glucose-Capillary: 143 mg/dL — ABNORMAL HIGH (ref 65–99)
Glucose-Capillary: 94 mg/dL (ref 65–99)
Glucose-Capillary: 103 mg/dL — ABNORMAL HIGH (ref 65–99)
Glucose-Capillary: 112 mg/dL — ABNORMAL HIGH (ref 65–99)

## 2017-07-26 LAB — CBC
HCT: 24 % — ABNORMAL LOW (ref 36.0–46.0)
HEMATOCRIT: 23.3 % — AB (ref 36.0–46.0)
Hemoglobin: 7.7 g/dL — ABNORMAL LOW (ref 12.0–15.0)
Hemoglobin: 7.6 g/dL — ABNORMAL LOW (ref 12.0–15.0)
MCH: 28.8 pg (ref 26.0–34.0)
MCH: 29.1 pg (ref 26.0–34.0)
MCHC: 32.1 g/dL (ref 30.0–36.0)
MCHC: 32.6 g/dL (ref 30.0–36.0)
MCV: 89.9 fL (ref 78.0–100.0)
MCV: 89.3 fL (ref 78.0–100.0)
Platelets: UNDETERMINED 10*3/uL (ref 150–400)
Platelets: 91 10*3/uL — ABNORMAL LOW (ref 150–400)
RBC: 2.67 MIL/uL — AB (ref 3.87–5.11)
RBC: 2.61 MIL/uL — AB (ref 3.87–5.11)
RDW: 21.3 % — AB (ref 11.5–15.5)
RDW: 21.4 % — AB (ref 11.5–15.5)
WBC: 10.7 10*3/uL — ABNORMAL HIGH (ref 4.0–10.5)
WBC: 9.6 10*3/uL (ref 4.0–10.5)

## 2017-07-26 LAB — APTT: aPTT: 34 seconds (ref 24–36)

## 2017-07-26 LAB — BLOOD GAS, ARTERIAL
ACID-BASE DEFICIT: 2.2 mmol/L — AB (ref 0.0–2.0)
Bicarbonate: 20.6 mmol/L (ref 20.0–28.0)
Collection site: 331471
O2 CONTENT: 3 L/min
O2 Saturation: 96.4 %
PCO2 ART: 28.1 mmHg — AB (ref 32.0–48.0)
PH ART: 7.478 — AB (ref 7.350–7.450)
Patient temperature: 98.6
pO2, Arterial: 76.2 mmHg — ABNORMAL LOW (ref 83.0–108.0)

## 2017-07-26 LAB — PHOSPHORUS: Phosphorus: 2.3 mg/dL — ABNORMAL LOW (ref 2.5–4.6)

## 2017-07-26 LAB — VAS US CAROTID
RIGHT VERTEBRAL DIAS: -13 cm/s
Right CCA prox dias: -11 cm/s
Right CCA prox sys: -57 cm/s
Right cca dist sys: -82 cm/s

## 2017-07-26 LAB — T3, FREE: T3 FREE: 1.4 pg/mL — AB (ref 2.0–4.4)

## 2017-07-26 LAB — PROTIME-INR
INR: 1.38
Prothrombin Time: 16.8 seconds — ABNORMAL HIGH (ref 11.4–15.2)

## 2017-07-26 LAB — MAGNESIUM: Magnesium: 1.2 mg/dL — ABNORMAL LOW (ref 1.7–2.4)

## 2017-07-26 MED ORDER — PIPERACILLIN-TAZOBACTAM IN DEX 2-0.25 GM/50ML IV SOLN
2.2500 g | Freq: Four times a day (QID) | INTRAVENOUS | Status: DC
  Administered 2017-07-26 – 2017-07-27 (×5): 2.25 g via INTRAVENOUS
  Filled 2017-07-26 (×6): qty 50

## 2017-07-26 MED ORDER — INSULIN ASPART 100 UNIT/ML ~~LOC~~ SOLN
1.0000 [IU] | SUBCUTANEOUS | Status: DC
  Administered 2017-07-26: 2 [IU] via SUBCUTANEOUS
  Administered 2017-07-26: 3 [IU] via SUBCUTANEOUS
  Administered 2017-07-26 – 2017-07-27 (×2): 1 [IU] via SUBCUTANEOUS
  Administered 2017-07-27 (×3): 2 [IU] via SUBCUTANEOUS
  Administered 2017-07-28: 1 [IU] via SUBCUTANEOUS

## 2017-07-26 MED ORDER — INSULIN ASPART 100 UNIT/ML ~~LOC~~ SOLN: 2.0000 [IU] | SUBCUTANEOUS | Status: DC

## 2017-07-26 MED ORDER — POTASSIUM CHLORIDE 10 MEQ/100ML IV SOLN
10.0000 meq | INTRAVENOUS | Status: AC
  Administered 2017-07-26 (×3): 10 meq via INTRAVENOUS
  Filled 2017-07-26 (×3): qty 100

## 2017-07-26 MED ORDER — MAGNESIUM SULFATE IN D5W 1-5 GM/100ML-% IV SOLN
1.0000 g | Freq: Once | INTRAVENOUS | Status: AC
  Administered 2017-07-26: 1 g via INTRAVENOUS
  Filled 2017-07-26: qty 100

## 2017-07-26 MED ORDER — SODIUM CHLORIDE 0.45 % IV BOLUS
500.0000 mL | Freq: Once | INTRAVENOUS | Status: AC
  Administered 2017-07-26: 500 mL via INTRAVENOUS

## 2017-07-26 NOTE — Progress Notes (Signed)
OT Cancellation Note  Patient Details Name: Katie Evans MRN: [id number] DOB: Jan 27, 1946   Cancelled Treatment:    Reason Eval/Treat Not Completed: Medical issues which prohibited therapy.  Not ready per nursing  [facility] North JerseyENCER,[name] 07/26/2017, 12:43 PM  Marica OtterMaryellen Anecia Nusbaum, OTR/L [phone] 07/26/2017

## 2017-07-26 NOTE — Progress Notes (Signed)
PULMONARY / CRITICAL CARE MEDICINE   Name: Katie Evans MRN: [id number] DOB: [birth year]    ADMISSION DATE:  07/24/2017 CONSULTATION DATE:  8/30  REFERRING MD:  Dr. David Stall  CHIEF COMPLAINT:  shock  HISTORY OF PRESENT ILLNESS:   71 year old female with PMH as below, which is significant for parkinson's, CHF, COPD, GERD, HTN, Hyperthyroid on methimazole, and Xarelto for PE. She has a long history of prescription drug abuse, overdoses, and non-compliance with medical therapies, which have ultimately lead to what is essentially failure to thrive. She resides in SNF where she was noted to have altered mental status for about 1 month by her sister. In June she was able to get around the SNF and use her cell phone and tablet. Daughter feels that something abrupt happened and that all changed. This was progressive until 8/29 when the patient's daughter demanded that she be sent to the ER where she was felt to be extremely dehydrated. She was provided with aggressive IVF resuscitation, and admitted to the hospitalist team for renal failure, encephalopathy, and possible UTI. CT scan of head concerning for CVA as well. She became hypotensive, which persisted despite IVF resuscitation and was transferred to ICU for pressor support. PCCM consulted.      SUBJECTIVE: More awake. Renal function worse.   VITAL SIGNS: BP 98/63 (BP Location: Left Arm)   Pulse (!) 51   Temp (!) 97.4 F (36.3 C) (Axillary)   Resp 18   Ht 5\' 4"  (1.626 m)   Wt 187 lb (84.8 kg)   SpO2 97%   BMI 32.10 kg/m   HEMODYNAMICS: CVP:  [6 mmHg-10 mmHg] 7 mmHg  VENTILATOR SETTINGS:    INTAKE / OUTPUT: I/O last 3 completed shifts: In: 9820.7 [I.V.:6530.7; Other:90; IV Piggyback:3200] Out: 155 [Urine:155]  PHYSICAL EXAMINATION: General:  Ill appearing female more awake today HEENT: dry oral mucosa EXB:MWUX affect Neuro: says ouch when toe pinched CV: HSR RRR PULM: rhonchi bilaterally LK:GMWN, non-tender, bsx4  active  Extremities: warm/dry, + edema  Skin: no rashes or lesions   LABS:  BMET  Recent Labs Lab 07/25/17 1717 07/25/17 2240 07/26/17 0315  NA 146* 146* 147*  K 3.8 3.8 3.3*  CL 119* 116* 115*  CO2 17* 19* 21*  BUN 74* 70* 74*  CREATININE 4.90* 4.84* 4.75*  GLUCOSE 297* 241* 213*    Electrolytes  Recent Labs Lab 07/24/17 1648  07/25/17 1717 07/25/17 2240 07/26/17 0315  CALCIUM 11.4*  < > 8.9 8.7* 8.7*  MG 2.1  --   --   --  1.2*  PHOS 4.2  --   --   --  2.3*  < > = values in this interval not displayed.  CBC  Recent Labs Lab 07/24/17 1648 07/25/17 0500 07/26/17 0315  WBC 19.5* 17.7* 10.7*  HGB 10.7* 8.9* 7.7*  HCT 36.8 29.4* 24.0*  PLT 187 189 PLATELET CLUMPS NOTED ON SMEAR, UNABLE TO ESTIMATE    Coag's  Recent Labs Lab 07/24/17 1057 07/26/17 0315  APTT  --  34  INR 2.13 1.38    Sepsis Markers  Recent Labs Lab 07/24/17 1117  LATICACIDVEN 1.73    ABG  Recent Labs Lab 07/25/17 0400 07/25/17 1156 07/26/17 0805  PHART 7.229* 7.377 7.478*  PCO2ART 22.2* 22.4* 28.1*  PO2ART 92.5 60.1* 76.2*    Liver Enzymes  Recent Labs Lab 07/24/17 1057  AST 19  ALT 11*  ALKPHOS 115  BILITOT 1.0  ALBUMIN 2.5*    Cardiac Enzymes  Recent Labs Lab 07/24/17 1057  TROPONINI 0.08*    Glucose  Recent Labs Lab 07/26/17 0016 07/26/17 0209 07/26/17 0359 07/26/17 0814  GLUCAP 225* 204* 170* 143*    Imaging Dg Chest Port 1 View  Result Date: 07/26/2017 CLINICAL DATA:  Respiratory failure . EXAM: PORTABLE CHEST 1 VIEW COMPARISON:  07/25/2017 . FINDINGS: Right IJ line noted in stable position. Heart size normal. Persistent left mid lung and left base infiltrate consistent pneumonia. Small left pleural effusion. No pneumothorax. Left shoulder replacement. IMPRESSION: 1. Right IJ line in stable position. 2. Persistent left mid lung field and left base infiltrate consistent with pneumonia . Electronically Signed   By: Maisie Fus  Register   On:  07/26/2017 07:03     STUDIES:  CT head 8/29 > Possible moderate acute infarct in the lateral left frontal lobe. Certainty is diminished by artifact from patient positioning. Chronic microvascular ischemia. Chronic left maxillary sinusitis. CT abd 8/29 > No acute intra-abdominal process. Large stool ball in the rectum.  Correlate for fecal impaction. Small amount of patchy centrilobular nodularity in the left lower lobe, which could reflect aspiration. 8/31 renal us>>  CULTURES: Blood 8/29 >staph mrsa>> Urine 8/30 >  ANTIBIOTICS: Zosyn 8/29 > Vanco 8/30 >  SIGNIFICANT EVENTS: 8/29 admit 8/30 tx to ICU for pressors.   LINES/TUBES: RIJ CVL 8/30 >  DISCUSSION: 71 year old female who lives in SNF after life long non-compliance and prescription medication abuse lead essentially to failure to thrive. Progressively worsening mental status since June per sister. Admitted 8/29 with AMS and renal failure. CT head concerning for stoke of unclear acuity. Renal failure and hypotension refractory to IVF resuscitation. Required ICU transfer and pressors.   ASSESSMENT / PLAN:  PULMONARY A: Risk poor airway protection  P:   Monitor DNI  CARDIOVASCULAR A:  Shock, likely septic, cvp 8 Chronic systolic/diastolic CHF Mild troponin elevation, likely demand H/o HTN  P:  Telemetry  Levophed for MAP goal > CVL place, check CVP->6 Echo ef 65% Maintenance fluids as below  Trend trop DNR if arrests  RENAL A:   AKI Hyperchloremia Hypernatremia Mixed metabolic acidosis Lab Results  Component Value Date   CREATININE 4.75 (H) 07/26/2017   CREATININE 4.84 (H) 07/25/2017   CREATININE 4.90 (H) 07/25/2017    Recent Labs Lab 07/25/17 1717 07/25/17 2240 07/26/17 0315  NA 146* 146* 147*    Recent Labs Lab 07/25/17 1717 07/25/17 2240 07/26/17 0315  K 3.8 3.8 3.3*     P:   Change IVF to sodium bicarb , now at 100 Sodium dropping at safe rate Family feels as though  she is not a candidate for long term dialysis or maybe even dialysis at all, should that need arise.  Bicarb drip  Creatine stable 8/31 but poor urine output cvp 6 will push fluids till 7-8 cvp 8/31 check renal US for completeness  GASTROINTESTINAL A:   GERD  P:   NPO Protonix for SUP Doubt family would agree to tube feeds if not sure to be temporary  HEMATOLOGIC  Recent Labs  07/25/17 0500 07/26/17 0315  HGB 8.9* 7.7*    A:   Anemia hgb at baseline 10-12, possible dilution factor  P:  Trend CBC Sq enoxaparin for VTE ppx   INFECTIOUS A:   Septic shock? Urosepsis likely source B C + MRSA 8/31 remains on levo drip but weaning  P:   Continue Zosyn  added vanco with SNF exposure, low threshold to DC with to  renal failure.  Follow cultures  ENDOCRINE A:   Hyperthyroid   TSH wnl  P:   Monitor glucose on chem panel Holding methimazole  NEUROLOGIC A:   Acute encephalopathy metabolic component, may have subacute CVA as well. Sister states worseing over several months.  Possible CVA More interactive 8/31  P:   Close monitoring MRI planned for 8/31 EEG planned for 8/31  FAMILY  - Updates: sister [facility](HCPOA Anabel HalonDana Taylor) updated at length 0400 8/30. She is very realistic and feels as though her sisters quality of life has not been good for some time. She feels as though nursing home neglected to care appropriately for her sister and wants to be conservative with care moving forward. Ok with aggressive medical therapies, but is very wary of treatments that would negatively effect quality of life like dialysis or tube feeds.   - Inter-disciplinary family meet or Palliative Care meeting due by:  9/5    App CCT 30 min   Brett CanalesSteve Nanna Ertle ACNP [facility] PCCM Pager [phone] till 3 pm If no answer page [phone] 07/26/2017, 10:14 AM

## 2017-07-26 NOTE — Procedures (Signed)
History: 71 year old female being evaluated for altered mental status  Sedation: None  Technique: This is a 21 channel routine scalp EEG performed at the bedside with bipolar and monopolar montages arranged in accordance to the international 10/20 system of electrode placement. One channel was dedicated to EKG recording.    Background: The background consists of generalized irregular delta and theta activities. With stimulation, there does appear to be a poorly formed posterior dominant rhythm of 5-6 Hz that is seen briefly.  Photic stimulation: Physiologic driving is not performed  EEG Abnormalities: 1) generalized irregular slow activity 2) slow PDR  Clinical Interpretation: This EEG is consistent with a generalized nonspecific cerebral dysfunction(encephalopathy). There was no seizure or seizure predisposition recorded on this study. Please note that a normal EEG does not preclude the possibility of epilepsy.   Ritta SlotMcNeill Alliya Marcon, MD [facility] [phone]  If 7pm- 7am, please page neurology on call as listed in AMION.

## 2017-07-26 NOTE — Evaluation (Signed)
SLP Cancellation Note  Patient Details Name: Berneda Roseamela Woehler MRN: [id number] DOB: Apr 18, 1946   Cancelled treatment:       Reason Eval/Treat Not Completed: Medical issues which prohibited therapy (spoke to RN who reports pt remains not cognizant enough for evaluation or po, will continue efforts; pt for MRI soon)   Donavan Burnetamara Sharian Delia, MS [facility] SLP [phone]

## 2017-07-26 NOTE — Progress Notes (Signed)
   Recent Labs Lab 07/26/17 0016  GLUCAP 225*    Recent Labs Lab 07/25/17 0205 07/25/17 0630 07/25/17 1100 07/25/17 1717 07/25/17 2240  CREATININE 5.53* 5.57* 5.00* 4.90* 4.84*   I/O last 3 completed shifts: In: 10619.4 [I.V.:5369.4; Other:100; IV Piggyback:5150] Out: 105 [Urine:105]   Plan ssi - icu phase 1  Dr. Kalman ShanMurali Jenaveve Fenstermaker, M.D., [facility] L[facility].C.C.P Pulmonary and Critical Care Medicine Staff Physician [facility]  Pulmonary and Critical Care Pager: [phone], If no answer or between  15:00h - 7:00h: call [phone]  07/26/2017 1:22 AM

## 2017-07-26 NOTE — Progress Notes (Signed)
PT Cancellation Note  Patient Details Name: Katie Evans MRN: [id number] DOB: 16-Dec-1945   Cancelled Treatment:     PT eval deferred.  RN reports pt continues hypotensive.  Will follow.   Maan Zarcone 07/26/2017, 12:45 PM

## 2017-07-26 NOTE — Progress Notes (Signed)
Pharmacy Antibiotic Note  Katie Evans is a 71 y.o. female [facility] with A[facility].  Broad abx were started on admission for UTI/sepsis.  Today, 07/26/2017: - day #2 abx - 8/31 CXR: Persistent left mid lung field and left base infiltrate consistent with pneumonia  -hypothermic, wbc trending down - scr elevated but trending down 4.75 (crcl~12), on bicarb drip  Plan: - now with suspected PNA, will increase zosyn 2.25 gn IV q6h - vancomycin 1gm x1 given on 8/30.  Will check random level on 9/01 at 0500 (~48 hrs after dose) to assess clearance.  Will redose if level is < 20  _______________________________  Height: 5\' 4"  (162.6 cm) Weight: 187 lb (84.8 kg) IBW/kg (Calculated) : 54.7  Temp (24hrs), Avg:96.3 F (35.7 C), Min:94.5 F (34.7 C), Max:97.9 F (36.6 C)   Recent Labs Lab 07/24/17 1057 07/24/17 1117 07/24/17 1648  07/25/17 0500 07/25/17 0630 07/25/17 1100 07/25/17 1717 07/25/17 2240 07/26/17 0315  WBC 27.3*  --  19.5*  --  17.7*  --   --   --   --  10.7*  CREATININE 7.52*  --  6.93*  6.90*  < >  --  5.57* 5.00* 4.90* 4.84* 4.75*  LATICACIDVEN  --  1.73  --   --   --   --   --   --   --   --   < > = values in this interval not displayed.  Estimated Creatinine Clearance: 11.9 mL/min (A) (by C-G formula based on SCr of 4.75 mg/dL (H)).    Allergies  Allergen Reactions  . Remeron [Mirtazapine]     Per MAR. No rxn listed    Antimicrobials this admission: 8/30 Vancomycin >> 8/29 Zosyn >>   Dose adjustments this admission: 9/31 at 0500 VT=  (1 gm x1 given on 8/30 at 0617)   Microbiology results: 8/29 BCx x2: 1/4 bottles with GPC in clusters (BCID= CoNS, MR)->? contaminant 8/29  UCx (not collected prior to 1st dose abx): 8/29 MRSA PCR (+)  Thank you for allowing pharmacy to be a part of this patient's care.  Lucia Gaskinsham, Torez Beauregard P 07/26/2017 9:22 AM

## 2017-07-26 NOTE — Progress Notes (Signed)
eLink Physician-Brief Progress Note Patient Name: Katie Evans DOB: 08-20-1946 MRN: [id number]   Date of Service  07/26/2017  HPI/Events of Note  Patient with sepsis and shock. On broad-spectrum antibiotics. Nurse reports frequent mucousy, watery bowel movements.   eICU Interventions  1. Ordered rectal fecal management system 2. Checking stool C. difficile with reflex PCR 3. Enteric precautions      Intervention Category Major Interventions: Other:  Katie Evans 07/26/2017, 6:25 PM

## 2017-07-26 NOTE — Progress Notes (Signed)
Notified MD of patient low body temperature.  Order to change to temp Foley obtained.  Will continue to monitor.

## 2017-07-26 NOTE — Progress Notes (Signed)
PHARMACY - PHYSICIAN COMMUNICATION CRITICAL VALUE ALERT - BLOOD CULTURE IDENTIFICATION (BCID)  Results for orders placed or performed during the hospital encounter of 07/24/17  Blood Culture ID Panel (Reflexed) (Collected: 07/24/2017 12:26 PM)  Result Value Ref Range   Enterococcus species NOT DETECTED NOT DETECTED   Listeria monocytogenes NOT DETECTED NOT DETECTED   Staphylococcus species DETECTED (A) NOT DETECTED   Staphylococcus aureus NOT DETECTED NOT DETECTED   Methicillin resistance DETECTED (A) NOT DETECTED   Streptococcus species NOT DETECTED NOT DETECTED   Streptococcus agalactiae NOT DETECTED NOT DETECTED   Streptococcus pneumoniae NOT DETECTED NOT DETECTED   Streptococcus pyogenes NOT DETECTED NOT DETECTED   Acinetobacter baumannii NOT DETECTED NOT DETECTED   Enterobacteriaceae species NOT DETECTED NOT DETECTED   Enterobacter cloacae complex NOT DETECTED NOT DETECTED   Escherichia coli NOT DETECTED NOT DETECTED   Klebsiella oxytoca NOT DETECTED NOT DETECTED   Klebsiella pneumoniae NOT DETECTED NOT DETECTED   Proteus species NOT DETECTED NOT DETECTED   Serratia marcescens NOT DETECTED NOT DETECTED   Haemophilus influenzae NOT DETECTED NOT DETECTED   Neisseria meningitidis NOT DETECTED NOT DETECTED   Pseudomonas aeruginosa NOT DETECTED NOT DETECTED   Candida albicans NOT DETECTED NOT DETECTED   Candida glabrata NOT DETECTED NOT DETECTED   Candida krusei NOT DETECTED NOT DETECTED   Candida parapsilosis NOT DETECTED NOT DETECTED   Candida tropicalis NOT DETECTED NOT DETECTED    Name of physician (or Provider) ContactedBrett Canales: Steve Minor  Changes to prescribed antibiotics required: continue vancomycin and zosyn  Lucia Gaskinsham, Jomayra Novitsky P 07/26/2017  9:38 AM

## 2017-07-26 NOTE — Progress Notes (Signed)
EEG Completed; Results Pending  

## 2017-07-27 ENCOUNTER — Inpatient Hospital Stay (HOSPITAL_COMMUNITY): Payer: Medicare Other

## 2017-07-27 LAB — GLUCOSE, CAPILLARY
GLUCOSE-CAPILLARY: 144 mg/dL — AB (ref 65–99)
GLUCOSE-CAPILLARY: 120 mg/dL — AB (ref 65–99)
Glucose-Capillary: 156 mg/dL — ABNORMAL HIGH (ref 65–99)
Glucose-Capillary: 163 mg/dL — ABNORMAL HIGH (ref 65–99)
Glucose-Capillary: 118 mg/dL — ABNORMAL HIGH (ref 65–99)
Glucose-Capillary: 108 mg/dL — ABNORMAL HIGH (ref 65–99)

## 2017-07-27 LAB — BASIC METABOLIC PANEL
Anion gap: 13 (ref 5–15)
BUN: 60 mg/dL — ABNORMAL HIGH (ref 6–20)
CALCIUM: 8.3 mg/dL — AB (ref 8.9–10.3)
CO2: 26 mmol/L (ref 22–32)
Chloride: 108 mmol/L (ref 101–111)
Creatinine, Ser: 4.23 mg/dL — ABNORMAL HIGH (ref 0.44–1.00)
GFR, EST AFRICAN AMERICAN: 11 mL/min — AB (ref >60)
GFR, EST NON AFRICAN AMERICAN: 10 mL/min — AB (ref >60)
Glucose, Bld: 116 mg/dL — ABNORMAL HIGH (ref 65–99)
POTASSIUM: 2.4 mmol/L — AB (ref 3.5–5.1)
SODIUM: 147 mmol/L — AB (ref 135–145)

## 2017-07-27 LAB — C DIFFICILE QUICK SCREEN W PCR REFLEX
C DIFFICILE (CDIFF) INTERP: NOT DETECTED
C DIFFICLE (CDIFF) ANTIGEN: NEGATIVE
C Diff toxin: NEGATIVE

## 2017-07-27 LAB — URINE CULTURE: Culture: 20000 — AB

## 2017-07-27 LAB — VANCOMYCIN, RANDOM: VANCOMYCIN RM: 15

## 2017-07-27 MED ORDER — POTASSIUM CHLORIDE 10 MEQ/100ML IV SOLN
10.0000 meq | INTRAVENOUS | Status: AC
  Administered 2017-07-27 (×4): 10 meq via INTRAVENOUS
  Filled 2017-07-27 (×4): qty 100

## 2017-07-27 MED ORDER — VANCOMYCIN HCL IN DEXTROSE 1-5 GM/200ML-% IV SOLN
1000.0000 mg | Freq: Once | INTRAVENOUS | Status: AC
  Administered 2017-07-27: 1000 mg via INTRAVENOUS
  Filled 2017-07-27: qty 200

## 2017-07-27 MED ORDER — SODIUM CHLORIDE 0.9 % IV SOLN
1.0000 g | Freq: Two times a day (BID) | INTRAVENOUS | Status: DC
  Administered 2017-07-27 – 2017-08-02 (×12): 1 g via INTRAVENOUS
  Filled 2017-07-27 (×13): qty 1

## 2017-07-27 NOTE — Progress Notes (Signed)
eLink Physician-Brief Progress Note Patient Name: Katie Evans DOB: 07-Sep-1946 MRN: [id number]   Date of Service  07/27/2017  HPI/Events of Note    eICU Interventions  Hypokalemia -repleted      Intervention Category Intermediate Interventions: Electrolyte abnormality - evaluation and management  Poonam Woehrle V. 07/27/2017, 6:11 AM

## 2017-07-27 NOTE — Progress Notes (Signed)
CRITICAL VALUE ALERT  Critical Value:  Potassium 2.4  Date & Time Notied:  07/27/17 @ 0558  Provider Notified: Dr. Vassie LollAlva  Orders Received/Actions taken: IV potassium replacement ordered

## 2017-07-27 NOTE — Progress Notes (Signed)
Pharmacy Antibiotic Note  Katie Evans is a 71 y.o. female [facility] with AMS.  Broad abx were started on admission for UTI/PNA/sepsis. UCX on 8/29 with ESBL ecoli -- to change zosyn to merrem on 9/1.  Today, 07/27/2017: -hypothermic, wbc trending down - scr elevated but trending down 4.23 (crcl~12), on bicarb drip - per Dr. Caren GriffinsMannum, cont vancomycin for now.    Plan: - merrem 1gm IV q12h - dosing vancomycin based on levels - f/u renal function  __________________________________  Height: 5\' 4"  (162.6 cm) Weight: 187 lb (84.8 kg) IBW/kg (Calculated) : 54.7  Temp (24hrs), Avg:96.9 F (36.1 C), Min:90 F (32.2 C), Max:99.1 F (37.3 C)   Recent Labs Lab 07/24/17 1057 07/24/17 1117 07/24/17 1648  07/25/17 0500  07/25/17 1100 07/25/17 1717 07/25/17 2240 07/26/17 0315 07/26/17 1845 07/27/17 0500  WBC 27.3*  --  19.5*  --  17.7*  --   --   --   --  10.7* 9.6  --   CREATININE 7.52*  --  6.93*  6.90*  < >  --   < > 5.00* 4.90* 4.84* 4.75*  --  4.23*  LATICACIDVEN  --  1.73  --   --   --   --   --   --   --   --   --   --   VANCORANDOM  --   --   --   --   --   --   --   --   --   --   --  15  < > = values in this interval not displayed.  Estimated Creatinine Clearance: 13.4 mL/min (A) (by C-G formula based on SCr of 4.23 mg/dL (H)).    Allergies  Allergen Reactions  . Remeron [Mirtazapine]     Per MAR. No rxn listed    Antimicrobials this admission: 8/30 Vancomycin >> 8/29 Zosyn >> 9/1 9/1 merrem>>  Dose adjustments this admission: 9/31 at 0500 VT=  15 (1 gm x1 given on 8/30 at 0617) -- redose 1gm x1 at 0720   Microbiology results: 8/29 BCx x2: 1/4 bottles with GPC in clusters (BCID= CoNS, MR)->? contaminant 8/29  UCx (not collected prior to 1st dose abx): 20K ESBL ecoli FINAL 8/29 MRSA PCR (+)  Thank you for allowing pharmacy to be a part of this patient's care.  Lucia Gaskinsham, Leahna Hewson P 07/27/2017 2:02 PM

## 2017-07-27 NOTE — Progress Notes (Signed)
Pharmacy Antibiotic Note  Katie Evans is a 71 y.o. female admitted on 07/24/2017 with sepsis.  Pharmacy has been consulted for Vancomycin dosing.  Plan: Vancomycin 1gm iv x1 now---Level <20 (15) Will recheck level ~48hr from dose  Height: 5\' 4"  (162.6 cm) Weight: 187 lb (84.8 kg) IBW/kg (Calculated) : 54.7  Temp (24hrs), Avg:96.5 F (35.8 C), Min:90 F (32.2 C), Max:99.1 F (37.3 C)   Recent Labs Lab 07/24/17 1057 07/24/17 1117 07/24/17 1648  07/25/17 0500  07/25/17 1100 07/25/17 1717 07/25/17 2240 07/26/17 0315 07/26/17 1845 07/27/17 0500  WBC 27.3*  --  19.5*  --  17.7*  --   --   --   --  10.7* 9.6  --   CREATININE 7.52*  --  6.93*  6.90*  < >  --   < > 5.00* 4.90* 4.84* 4.75*  --  4.23*  LATICACIDVEN  --  1.73  --   --   --   --   --   --   --   --   --   --   VANCORANDOM  --   --   --   --   --   --   --   --   --   --   --  15  < > = values in this interval not displayed.  Estimated Creatinine Clearance: 13.4 mL/min (A) (by C-G formula based on SCr of 4.23 mg/dL (H)).    Allergies  Allergen Reactions  . Remeron [Mirtazapine]     Per MAR. No rxn listed    Antimicrobials this admission: 8/30 Vancomycin >> 8/29 Zosyn >>   Dose adjustments this admission: 9/1 at 0500 VT= 15 (1gm x1 given on 8/30 at 0617)  Microbiology results: 8/29BCx x2: 1/4 bottles with GPC in clusters (BCID= CoNS, MR)->? contaminant 8/29 UCx (not collected prior to 1st dose abx): 8/29 MRSA PCR (+)  Thank you for allowing pharmacy to be a part of this patient's care.  Aleene DavidsonGrimsley Jr, Haskel Dewalt Crowford 07/27/2017 6:47 AM

## 2017-07-27 NOTE — Evaluation (Signed)
Clinical/Bedside Swallow Evaluation Patient Details  Name: Katie Evans MRN: [id number] Date of Birth: [birth year]  Today's Date: 07/27/2017 Time: SLP Start Time (ACUTE ONLY): 4098 SLP Stop Time (ACUTE ONLY): 0855 SLP Time Calculation (min) (ACUTE ONLY): 16 min  Past Medical History:  Past Medical History:  Diagnosis Date  . Allergic rhinitis   . Anemia   . Anxiety   . Arthritis   . Bruising   . Carpal tunnel syndrome   . CHF (congestive heart failure) (HCC)   . Chronic diarrhea   . Chronic pulmonary embolism (HCC)   . Demand ischemia of myocardium (HCC)   . Flatulence   . Generalized anxiety disorder   . GERD without esophagitis   . Hemorrhoids   . Hyperlipidemia   . Hypertension   . Hyperthyroidism   . Hypokalemia   . Long term current use of anticoagulant therapy   . Major depressive disorder   . Metabolic encephalopathy   . Overactive bladder   . Protein calorie malnutrition (HCC)   . Psychosis   . Renal disorder   . RLS (restless legs syndrome)   . Thrombocytopathia (HCC)   . Thrombocytopenia (HCC)   . Vaginal atrophy    Past Surgical History:  Past Surgical History:  Procedure Laterality Date  . ABDOMINAL HYSTERECTOMY    . APPENDECTOMY    . BACK SURGERY    . GASTRIC BYPASS    . SHOULDER SURGERY    . SPINE SURGERY     spinal fusion  . TONSILLECTOMY     HPI:  Katie Evans a 71 y.o.femalewith medical history significant of past medical history of chronic renal disease stage III, chronic heart failure, chronic pulmonary embolism on Xarelto, major depressive disorder is sent here from [facility] for altered mental status. Found to have septic shock secondary to  UTI, acute renal failure, hyponatremia, metabolic acidosis and GPC bacteremia. CXR stable appearance of left lower lobar pneumonia, small left pleural effusion   Assessment / Plan / Recommendation Clinical Impression  Pt alert, followed majority of commands with delays; very flat affect; low  vocal quality (breathy although not intubated; likely d/t respiratory weakness). Delayed oral transit and decreased manipulation. Laryngeal elevation very reduced during palpation and delayed throat clears indicative of possible suboptimal airway protection. As SLP suctioning oral cavity pt began having moderate amount of bile-like emesis removed from oral cavity. Recommend continue NPO status with oral care. Informed RN of results and pt was left in an upright position in case of additional emesis. Will continue to follow. SLP Visit Diagnosis: Dysphagia, unspecified (R13.10)    Aspiration Risk   (mod-severe)    Diet Recommendation NPO   Medication Administration: Via alternative means    Other  Recommendations Oral Care Recommendations: Oral care QID   Follow up Recommendations Skilled Nursing facility      Frequency and Duration min 2x/week  2 weeks       Prognosis Prognosis for Safe Diet Advancement: Good Barriers to Reach Goals: Cognitive deficits      Swallow Study   General HPI: Katie Evans a 71 y.o.femalewith medical history significant of past medical history of chronic renal disease stage III, chronic heart failure, chronic pulmonary embolism on Xarelto, major depressive disorder is sent here from [facility] for altered mental status. Found to have septic shock secondary to  UTI, acute renal failure, hyponatremia, metabolic acidosis and GPC bacteremia. CXR stable appearance of left lower lobar pneumonia, small left pleural effusion Type of Study: Bedside Swallow  Evaluation Previous Swallow Assessment:  (none) Diet Prior to this Study: NPO Temperature Spikes Noted: No Respiratory Status: Room air History of Recent Intubation: No Behavior/Cognition: Cooperative;Requires cueing;Alert Oral Cavity Assessment: Dry Oral Care Completed by SLP: Yes Oral Cavity - Dentition:  (has majority, missing several?) Vision: Functional for self-feeding Self-Feeding Abilities:  Needs assist Patient Positioning: Upright in bed Baseline Vocal Quality: Low vocal intensity Volitional Cough: Weak Volitional Swallow: Unable to elicit    Oral/Motor/Sensory Function Overall Oral Motor/Sensory Function: Generalized oral weakness   Ice Chips Ice chips: Impaired Presentation: Spoon Oral Phase Functional Implications: Prolonged oral transit Pharyngeal Phase Impairments: Suspected delayed Swallow;Decreased hyoid-laryngeal movement   Thin Liquid Thin Liquid: Impaired Oral Phase Impairments: Reduced lingual movement/coordination Oral Phase Functional Implications: Prolonged oral transit Pharyngeal  Phase Impairments: Suspected delayed Swallow;Decreased hyoid-laryngeal movement;Throat Clearing - Delayed    Nectar Thick Nectar Thick Liquid: Not tested   Honey Thick Honey Thick Liquid: Not tested   Puree Puree: Not tested   Solid   GO   Solid: Not tested        Katie Evans, Katie Evans 07/27/2017,9:23 AM  Katie Evans Katie FaceLitaker M.Ed [facility]-SLP Pager [phone]

## 2017-07-27 NOTE — Progress Notes (Signed)
PT Cancellation Note  Patient Details Name: Katie Evans MRN: [id number] DOB: [birth year]   Cancelled Treatment:     PT eval deferred at request of RN 2* pt's continued BP issues.  Will follow.   Robertt Buda 07/27/2017, 8:50 AM

## 2017-07-27 NOTE — Progress Notes (Signed)
PULMONARY / CRITICAL CARE MEDICINE   Name: Katie Evans MRN: [id number] DOB: 06/14/1946    ADMISSION DATE:  07/24/2017 CONSULTATION DATE:  8/30  REFERRING MD:  Dr. David Stall  CHIEF COMPLAINT:  shock  HISTORY OF PRESENT ILLNESS:   71 year old female with PMH as below, which is significant for parkinson's, CHF, COPD, GERD, HTN, Hyperthyroid on methimazole, and Xarelto for PE. She has a long history of prescription drug abuse, overdoses, and non-compliance with medical therapies, which have ultimately lead to what is essentially failure to thrive. She resides in SNF where she was noted to have altered mental status for about 1 month by her sister. In June she was able to get around the SNF and use her cell phone and tablet. Daughter feels that something abrupt happened and that all changed. This was progressive until 8/29 when the patient's daughter demanded that she be sent to the ER where she was felt to be extremely dehydrated. She was provided with aggressive IVF resuscitation, and admitted to the hospitalist team for renal failure, encephalopathy, and possible UTI. CT scan of head concerning for CVA as well. She became hypotensive, which persisted despite IVF resuscitation and was transferred to ICU for pressor support. PCCM consulted.   SUBJECTIVE: More awake, Still on pressor  VITAL SIGNS: BP (!) 96/56   Pulse (!) 56   Temp 97.7 F (36.5 C)   Resp 17   Ht 5\' 4"  (1.626 m)   Wt 187 lb (84.8 kg)   SpO2 100%   BMI 32.10 kg/m   HEMODYNAMICS:    VENTILATOR SETTINGS:    INTAKE / OUTPUT: I/O last 3 completed shifts: In: 5228 [I.V.:4028; IV Piggyback:1200] Out: 770 [Urine:770]  PHYSICAL EXAMINATION: Gen:      No acute distress, ill appearing HEENT:  EOMI, sclera anicteric Neck:     No masses; no thyromegaly Lungs:    Clear to auscultation bilaterally; normal respiratory effort CV:         Regular rate and rhythm; no murmurs Abd:      + bowel sounds; soft, non-tender; no  palpable masses, no distension Ext:    No edema; adequate peripheral perfusion Skin:      Warm and dry; no rash Neuro: Awake, mild confusion  LABS:  BMET  Recent Labs Lab 07/25/17 2240 07/26/17 0315 07/27/17 0500  NA 146* 147* 147*  K 3.8 3.3* 2.4*  CL 116* 115* 108  CO2 19* 21* 26  BUN 70* 74* 60*  CREATININE 4.84* 4.75* 4.23*  GLUCOSE 241* 213* 116*    Electrolytes  Recent Labs Lab 07/24/17 1648  07/25/17 2240 07/26/17 0315 07/27/17 0500  CALCIUM 11.4*  < > 8.7* 8.7* 8.3*  MG 2.1  --   --  1.2*  --   PHOS 4.2  --   --  2.3*  --   < > = values in this interval not displayed.  CBC  Recent Labs Lab 07/25/17 0500 07/26/17 0315 07/26/17 1845  WBC 17.7* 10.7* 9.6  HGB 8.9* 7.7* 7.6*  HCT 29.4* 24.0* 23.3*  PLT 189 PLATELET CLUMPS NOTED ON SMEAR, UNABLE TO ESTIMATE 91*    Coag's  Recent Labs Lab 07/24/17 1057 07/26/17 0315  APTT  --  34  INR 2.13 1.38    Sepsis Markers  Recent Labs Lab 07/24/17 1117  LATICACIDVEN 1.73    ABG  Recent Labs Lab 07/25/17 0400 07/25/17 1156 07/26/17 0805  PHART 7.229* 7.377 7.478*  PCO2ART 22.2* 22.4* 28.1*  PO2ART  92.5 60.1* 76.2*    Liver Enzymes  Recent Labs Lab 07/24/17 1057  AST 19  ALT 11*  ALKPHOS 115  BILITOT 1.0  ALBUMIN 2.5*    Cardiac Enzymes  Recent Labs Lab 07/24/17 1057  TROPONINI 0.08*    Glucose  Recent Labs Lab 07/26/17 1621 07/26/17 1926 07/26/17 2336 07/27/17 0322 07/27/17 0834 07/27/17 1152  GLUCAP 103* 112* 133* 156* 163* 118*    Imaging Mr Brain Wo Contrast  Result Date: 07/26/2017 CLINICAL DATA:  71 y/o F; altered mental status, possible encephalopathy. Abnormal CT of head. EXAM: MRI HEAD WITHOUT CONTRAST TECHNIQUE: Multiplanar, multiecho pulse sequences of the brain and surrounding structures were obtained without intravenous contrast. COMPARISON:  07/24/2017 CT head FINDINGS: Brain: No acute infarction, hemorrhage, hydrocephalus, extra-axial collection or  mass lesion. Several nonspecific foci of T2 FLAIR hyperintense signal abnormality in subcortical and periventricular white matter and patchy signal within the pons is compatible with moderate chronic microvascular ischemic changes for age. Moderate brain parenchymal volume loss. Vascular: Normal flow voids. Skull and upper cervical spine: Normal marrow signal. Sinuses/Orbits: Left maxillary sinus opacification with chronic inflammatory changes of the wall of the sinus. Small left frontal sinus mucous retention cyst. No abnormal signal of mastoid air cells. Orbits are unremarkable. Other: None. IMPRESSION: 1. No acute intracranial abnormality identified. Hypodensity on CT does not have a corresponding signal abnormality on MRI and is likely artifactual. 2. Moderate chronic microvascular ischemic changes and moderate parenchymal volume loss of the brain. 3. Left maxillary sinus opacification and chronic inflammatory changes. Electronically Signed   By: Mitzi Hansen M.D.   On: 07/26/2017 22:37   US Renal  Result Date: 07/26/2017 CLINICAL DATA:  Renal failure. EXAM: RENAL / URINARY TRACT ULTRASOUND COMPLETE COMPARISON:  CT 07/24/2017 . FINDINGS: Right Kidney: Length: 8.9 cm. Echogenicity within normal limits. No mass or hydronephrosis visualized. Left Kidney: Length: 8.7 cm. Echogenicity within normal limits. No mass or hydronephrosis visualized. Bladder: Appears normal for degree of bladder distention. Foley catheter in the bladder. IMPRESSION: No acute or focal abnormality identified. Electronically Signed   By: Maisie Fus  Register   On: 07/26/2017 15:08   Dg Chest Port 1 View  Result Date: 07/27/2017 CLINICAL DATA:  Respiratory failure. EXAM: PORTABLE CHEST 1 VIEW COMPARISON:  One-view chest x-ray 07/26/2017. FINDINGS: Heart size is exaggerated by low lung volumes. A right IJ line is stable. Left lower lobe airspace disease is again noted. The right lung remains clear. A small left pleural effusion is  noted. IMPRESSION: 1. Stable appearance of left lower lobar pneumonia. 2. Small left pleural effusion. Electronically Signed   By: Marin Roberts M.D.   On: 07/27/2017 07:27   STUDIES:  CT head 8/29 > Possible moderate acute infarct in the lateral left frontal lobe. Certainty is diminished by artifact from patient positioning. Chronic microvascular ischemia. Chronic left maxillary sinusitis. CT abd 8/29 > No acute intra-abdominal process. Large stool ball in the rectum.  Correlate for fecal impaction. Small amount of patchy centrilobular nodularity in the left lower lobe, which could reflect aspiration. 8/31 renal us>> neg EEG 8/31 > slowing MRI 8/31 > no acute abnornmality  CULTURES: Blood 8/29 > 1/2 CNS Urine 8/30 > E coli  ANTIBIOTICS: Zosyn 8/29 > Vanco 8/30 >  SIGNIFICANT EVENTS: 8/29 admit 8/30 tx to ICU for pressors.   LINES/TUBES: RIJ CVL 8/30 >  DISCUSSION: 71 year old female who lives in SNF after life long non-compliance and prescription medication abuse lead essentially to failure to thrive.  Progressively worsening mental status since June per sister. Admitted 8/29 with AMS and renal failure. Escherichia coli urosepsis  ASSESSMENT / PLAN:  PULMONARY A: Risk poor airway protection P:   Monitor Supplemental oxygen  CARDIOVASCULAR A:  Shock, likely septic, cvp 8 Chronic systolic/diastolic CHF Mild troponin elevation, likely demand H/o HTN  P:  Telemetry  Wean down Levophed Echo ef 65% Maintenance fluids as below   DNR if arrests  RENAL A:   AKI Hyperchloremia Hypernatremia Mixed metabolic acidosis  P:   Off bicarb drip Replete K Sodium dropping at safe rate Family feels as though she is not a candidate for long term dialysis or maybe even dialysis at all, should that need arise.   GASTROINTESTINAL A:   GERD  P:   NPO Protonix for SUP Doubt family would agree to tube feeds if not sure to be temporary Will need speech  eval  HEMATOLOGIC A:   Anemia hgb at baseline 10-12, possible dilution factor  P:  Trend CBC Sq enoxaparin for VTE ppx   INFECTIOUS A:   Septic shock, e coli CNS - likely contaminant  P:   Continue Zosyn On vanco with SNF exposure, low threshold to DC with to renal failure.  Follow cultures  ENDOCRINE A:   Hyperthyroid   TSH wnl  P:   Monitor glucose on chem panel Holding methimazole  NEUROLOGIC A:   Acute encephalopathy metabolic component, No CVA on MRI EEG is OK  P:   Close monitoring  FAMILY  - Updates: sister [facility](HCPOA Anabel HalonDana Taylor) updated at length 8/30. She is very realistic and feels as though her sisters quality of life has not been good for some time. She feels as though nursing home neglected to care appropriately for her sister and wants to be conservative with care moving forward. Ok with aggressive medical therapies, but is very wary of treatments that would negatively effect quality of life like dialysis or tube feeds.   - Inter-disciplinary family meet or Palliative Care meeting due by:  9/5   The patient is critically ill with multiple organ system failure and requires high complexity decision making for assessment and support, frequent evaluation and titration of therapies, advanced monitoring, review of radiographic studies and interpretation of complex data.   Critical Care Time devoted to patient care services, exclusive of separately billable procedures, described in this note is 35 minutes.   Chilton GreathousePraveen Jax Kentner MD [place] [facility] Pager [phone] If no answer or after 3pm call: [phone] 07/27/2017, 1:46 PM

## 2017-07-28 ENCOUNTER — Inpatient Hospital Stay (HOSPITAL_COMMUNITY): Payer: Medicare Other

## 2017-07-28 LAB — CBC
HCT: 24 % — ABNORMAL LOW (ref 36.0–46.0)
Hemoglobin: 7.8 g/dL — ABNORMAL LOW (ref 12.0–15.0)
MCH: 28.6 pg (ref 26.0–34.0)
MCHC: 32.5 g/dL (ref 30.0–36.0)
MCV: 87.9 fL (ref 78.0–100.0)
PLATELETS: 85 10*3/uL — AB (ref 150–400)
RBC: 2.73 MIL/uL — ABNORMAL LOW (ref 3.87–5.11)
RDW: 20.5 % — AB (ref 11.5–15.5)
WBC: 8.7 10*3/uL (ref 4.0–10.5)

## 2017-07-28 LAB — BASIC METABOLIC PANEL
ANION GAP: 10 (ref 5–15)
BUN: 55 mg/dL — ABNORMAL HIGH (ref 6–20)
CALCIUM: 8.1 mg/dL — AB (ref 8.9–10.3)
CO2: 30 mmol/L (ref 22–32)
Chloride: 105 mmol/L (ref 101–111)
Creatinine, Ser: 3.68 mg/dL — ABNORMAL HIGH (ref 0.44–1.00)
GFR calc Af Amer: 14 mL/min — ABNORMAL LOW (ref >60)
GFR, EST NON AFRICAN AMERICAN: 12 mL/min — AB (ref >60)
GLUCOSE: 101 mg/dL — AB (ref 65–99)
Potassium: 2.8 mmol/L — ABNORMAL LOW (ref 3.5–5.1)
SODIUM: 145 mmol/L (ref 135–145)

## 2017-07-28 LAB — MAGNESIUM: MAGNESIUM: 1 mg/dL — AB (ref 1.7–2.4)

## 2017-07-28 LAB — GLUCOSE, CAPILLARY
GLUCOSE-CAPILLARY: 103 mg/dL — AB (ref 65–99)
GLUCOSE-CAPILLARY: 119 mg/dL — AB (ref 65–99)
Glucose-Capillary: 111 mg/dL — ABNORMAL HIGH (ref 65–99)
Glucose-Capillary: 129 mg/dL — ABNORMAL HIGH (ref 65–99)
Glucose-Capillary: 96 mg/dL (ref 65–99)
Glucose-Capillary: 96 mg/dL (ref 65–99)

## 2017-07-28 LAB — CULTURE, BLOOD (ROUTINE X 2): Special Requests: ADEQUATE

## 2017-07-28 LAB — VANCOMYCIN, RANDOM: VANCOMYCIN RM: 28

## 2017-07-28 LAB — PHOSPHORUS: Phosphorus: 2.3 mg/dL — ABNORMAL LOW (ref 2.5–4.6)

## 2017-07-28 MED ORDER — SODIUM CHLORIDE 0.9 % IV SOLN
INTRAVENOUS | Status: DC
  Administered 2017-07-28 – 2017-07-29 (×2): via INTRAVENOUS

## 2017-07-28 MED ORDER — POTASSIUM CHLORIDE 10 MEQ/50ML IV SOLN
10.0000 meq | INTRAVENOUS | Status: AC
  Administered 2017-07-28 (×4): 10 meq via INTRAVENOUS
  Filled 2017-07-28 (×4): qty 50

## 2017-07-28 MED ORDER — POTASSIUM PHOSPHATES 15 MMOLE/5ML IV SOLN
10.0000 mmol | Freq: Once | INTRAVENOUS | Status: AC
  Administered 2017-07-28: 10 mmol via INTRAVENOUS
  Filled 2017-07-28: qty 3.33

## 2017-07-28 MED ORDER — MAGNESIUM SULFATE 4 GM/100ML IV SOLN
4.0000 g | Freq: Once | INTRAVENOUS | Status: AC
  Administered 2017-07-28: 4 g via INTRAVENOUS
  Filled 2017-07-28: qty 100

## 2017-07-28 NOTE — Progress Notes (Signed)
eLink Physician-Brief Progress Note Patient Name: Katie Evans DOB: 03/31/1946 MRN: [id number]   Date of Service  07/28/2017  HPI/Events of Note    eICU Interventions  Hypokalemia/ hypo-phos, hypomag -repleted      Intervention Category Major Interventions: Electrolyte abnormality - evaluation and management  ALVA,RAKESH V. 07/28/2017, 4:55 AM

## 2017-07-28 NOTE — Progress Notes (Signed)
PULMONARY / CRITICAL CARE MEDICINE   Name: Katie Evans MRN: [id number] DOB: 05/06/1946    ADMISSION DATE:  07/24/2017 CONSULTATION DATE:  8/30  REFERRING MD:  Dr. David Stall  CHIEF COMPLAINT:  shock  HISTORY OF PRESENT ILLNESS:   71 year old female with PMH as below, which is significant for parkinson's, CHF, COPD, GERD, HTN, Hyperthyroid on methimazole, and Xarelto for PE. She has a long history of prescription drug abuse, overdoses, and non-compliance with medical therapies, which have ultimately lead to what is essentially failure to thrive. She resides in SNF where she was noted to have altered mental status for about 1 month by her sister. In June she was able to get around the SNF and use her cell phone and tablet. Daughter feels that something abrupt happened and that all changed. This was progressive until 8/29 when the patient's daughter demanded that she be sent to the ER where she was felt to be extremely dehydrated. She was provided with aggressive IVF resuscitation, and admitted to the hospitalist team for renal failure, encephalopathy, and possible UTI. CT scan of head concerning for CVA as well. She became hypotensive, which persisted despite IVF resuscitation and was transferred to ICU for pressor support. PCCM consulted.   SUBJECTIVE:  Mental status is improving Pressors are weaning down.  VITAL SIGNS: BP 107/80   Pulse (!) 53   Temp (!) 97.3 F (36.3 C)   Resp 16   Ht 5\' 4"  (1.626 m)   Wt 187 lb (84.8 kg)   SpO2 96%   BMI 32.10 kg/m   HEMODYNAMICS: CVP:  [3 mmHg] 3 mmHg  VENTILATOR SETTINGS:    INTAKE / OUTPUT: I/O last 3 completed shifts: In: 3147.3 [I.V.:2447.3; IV Piggyback:700] Out: 1370 [Urine:1370]  PHYSICAL EXAMINATION: Gen:      No acute distress, ill appearing HEENT:  EOMI, sclera anicteric Neck:     No masses; no thyromegaly Lungs:    Clear to auscultation bilaterally; normal respiratory effort CV:         Regular rate and rhythm; no  murmurs Abd:      + bowel sounds; soft, non-tender; no palpable masses, no distension Ext:    No edema; adequate peripheral perfusion Skin:      Warm and dry; no rash Neuro: Mild confusion   LABS:  BMET  Recent Labs Lab 07/26/17 0315 07/27/17 0500 07/28/17 0330  NA 147* 147* 145  K 3.3* 2.4* 2.8*  CL 115* 108 105  CO2 21* 26 30  BUN 74* 60* 55*  CREATININE 4.75* 4.23* 3.68*  GLUCOSE 213* 116* 101*    Electrolytes  Recent Labs Lab 07/24/17 1648  07/26/17 0315 07/27/17 0500 07/28/17 0330  CALCIUM 11.4*  < > 8.7* 8.3* 8.1*  MG 2.1  --  1.2*  --  1.0*  PHOS 4.2  --  2.3*  --  2.3*  < > = values in this interval not displayed.  CBC  Recent Labs Lab 07/26/17 0315 07/26/17 1845 07/28/17 0330  WBC 10.7* 9.6 8.7  HGB 7.7* 7.6* 7.8*  HCT 24.0* 23.3* 24.0*  PLT PLATELET CLUMPS NOTED ON SMEAR, UNABLE TO ESTIMATE 91* 85*    Coag's  Recent Labs Lab 07/24/17 1057 07/26/17 0315  APTT  --  34  INR 2.13 1.38    Sepsis Markers  Recent Labs Lab 07/24/17 1117  LATICACIDVEN 1.73    ABG  Recent Labs Lab 07/25/17 0400 07/25/17 1156 07/26/17 0805  PHART 7.229* 7.377 7.478*  PCO2ART 22.2*  22.4* 28.1*  PO2ART 92.5 60.1* 76.2*    Liver Enzymes  Recent Labs Lab 07/24/17 1057  AST 19  ALT 11*  ALKPHOS 115  BILITOT 1.0  ALBUMIN 2.5*    Cardiac Enzymes  Recent Labs Lab 07/24/17 1057  TROPONINI 0.08*    Glucose  Recent Labs Lab 07/27/17 1529 07/27/17 1943 07/27/17 2314 07/28/17 0310 07/28/17 0751 07/28/17 1226  GLUCAP 144* 120* 108* 103* 111* 129*    Imaging Dg Chest Port 1 View  Result Date: 07/28/2017 CLINICAL DATA:  Acute respiratory failure. EXAM: PORTABLE CHEST 1 VIEW COMPARISON:  One-view chest x-ray 07/27/2017 FINDINGS: The heart size is exaggerated by low lung volumes. A right IJ line is stable. Left lower lobe pneumonia is slightly improved. Mild right basilar airspace disease is stable. Left shoulder replacement is noted.  IMPRESSION: 1. Slightly improved left lower lobe pneumonia. 2. Stable right IJ line. Electronically Signed   By: Marin Robertshristopher  Mattern M.D.   On: 07/28/2017 07:15   STUDIES:  CT head 8/29 > Possible moderate acute infarct in the lateral left frontal lobe. Certainty is diminished by artifact from patient positioning. Chronic microvascular ischemia. Chronic left maxillary sinusitis. CT abd 8/29 > No acute intra-abdominal process. Large stool ball in the rectum.  Correlate for fecal impaction. Small amount of patchy centrilobular nodularity in the left lower lobe, which could reflect aspiration. 8/31 renal us>> neg EEG 8/31 > slowing MRI 8/31 > no acute abnornmality, chronic microvascular changes, atrophy  CULTURES: Blood 8/29 > 1/2 CNS Urine 8/30 > E coli (ESBL)  ANTIBIOTICS: Zosyn 8/29 > 9/1 Vanco 8/30 > 9/2 Meropenem 9/1 >  SIGNIFICANT EVENTS: 8/29 admit 8/30 tx to ICU for pressors.   LINES/TUBES: RIJ CVL 8/30 >  DISCUSSION: 71 year old female who lives in SNF after life long non-compliance and prescription medication abuse lead essentially to failure to thrive. Progressively worsening mental status since June per sister. Admitted 8/29 with AMS and renal failure. Escherichia coli urosepsis  ASSESSMENT / PLAN:  PULMONARY A: Risk poor airway protection P:   Monitor Supplemental oxygen  CARDIOVASCULAR A:  Shock, likely septic, cvp 8 Chronic systolic/diastolic CHF Mild troponin elevation, likely demand H/o HTN  P:  Telemetry  Wean down Levophed Start NS at 100cc/hr  RENAL A:   AKI- better Hyperchloremia Hypernatremia Improved Mixed metabolic acidosis  P:   Replete lytes  GASTROINTESTINAL A:   GERD  P:   NPO Protonix for SUP Failed speech eval. May need temporary feeding tube  HEMATOLOGIC A:   Anemia hgb at baseline 10-12, possible dilution factor  P:  Trend CBC Sq enoxaparin for VTE ppx   INFECTIOUS A:   Septic shock, e coli CNS - likely  contaminant  P:   On meropenem for esbl Repeat Bcx. If negative we can stop vanco   ENDOCRINE A:   Hyperthyroid   TSH wnl  P:   Monitor glucose on chem panel Holding methimazole  NEUROLOGIC A:   Acute encephalopathy metabolic component, No CVA on MRI EEG is OK  P:   Close monitoring  FAMILY  - Updates: sister [facility](HCPOA Anabel HalonDana Taylor) updated at length 8/30. She is very realistic and feels as though her sisters quality of life has not been good for some time. She feels as though nursing home neglected to care appropriately for her sister and wants to be conservative with care moving forward. Ok with aggressive medical therapies, but is very wary of treatments that would negatively effect quality of life like  dialysis or tube feeds.   - Inter-disciplinary family meet or Palliative Care meeting due by:  9/5   The patient is critically ill with multiple organ system failure and requires high complexity decision making for assessment and support, frequent evaluation and titration of therapies, advanced monitoring, review of radiographic studies and interpretation of complex data.   Critical Care Time devoted to patient care services, exclusive of separately billable procedures, described in this note is 35 minutes.   Chilton Greathouse MD [place] [facility] Pager [phone] If no answer or after 3pm call: [phone] 07/28/2017, 2:27 PM

## 2017-07-28 NOTE — Progress Notes (Signed)
Pharmacy Antibiotic Note  Katie Evans is a 71 y.o. female [facility] with AMS.  Broad abx were started on admission for UTI/PNA/sepsis. UCX on 8/29 with ESBL ecoli -- to change zosyn to merrem on 9/1.  Today, 07/28/2017: -hypothermic, wbc trending down - scr elevated but trending down 3.68 (crcl~15), on bicarb drip 8/30-9/1  - random vancomycin level now back as 28 (~29 hrs after 1 gm dose)  Plan: - merrem 1gm IV q12h - will re-check another vancomycin level with AM labs and redose if < 20 - f/u renal function  __________________________________  Height: 5\' 4"  (162.6 cm) Weight: 187 lb (84.8 kg) IBW/kg (Calculated) : 54.7  Temp (24hrs), Avg:97.2 F (36.2 C), Min:96.4 F (35.8 C), Max:98.1 F (36.7 C)   Recent Labs Lab 07/24/17 1117 07/24/17 1648  07/25/17 0500  07/25/17 1717 07/25/17 2240 07/26/17 0315 07/26/17 1845 07/27/17 0500 07/28/17 0330  WBC  --  19.5*  --  17.7*  --   --   --  10.7* 9.6  --  8.7  CREATININE  --  6.93*  6.90*  < >  --   < > 4.90* 4.84* 4.75*  --  4.23* 3.68*  LATICACIDVEN 1.73  --   --   --   --   --   --   --   --   --   --   VANCORANDOM  --   --   --   --   --   --   --   --   --  15  --   < > = values in this interval not displayed.  Estimated Creatinine Clearance: 15.4 mL/min (A) (by C-G formula based on SCr of 3.68 mg/dL (H)).    Allergies  Allergen Reactions  . Remeron [Mirtazapine]     Per MAR. No rxn listed   Antimicrobials this admission: 8/30 Vancomycin >> 8/29 Zosyn >> 9/1 9/1 merrem>>  Dose adjustments this admission: 9/31 at 0500 VT=  15 (1 gm x1 given on 8/30 at 0617) -- redose 1gm x1 at 0720 9/2 VR at 1228= 28 (29 hrs after 1gm dose) 9/3 VT at 0500 =    Microbiology results: 8/29 BCx x2: 1/4 bottles with GPC in clusters (BCID= CoNS, MR)->? contaminant 8/29  UCx (not collected prior to 1st dose abx): 20K ESBL ecoli FINAL 8/29 MRSA PCR (+) 8/31 cdiff PCR (-)    Thank you  for allowing pharmacy to be a part of this patient's care.  Lucia Gaskinsham, Mattelyn Imhoff P 07/28/2017 11:18 AM

## 2017-07-28 NOTE — Progress Notes (Signed)
SLP Cancellation Note  Patient Details Name: Berneda Roseamela Bordeau MRN: [id number] DOB: [birth year]   Cancelled treatment:       Reason Eval/Treat Not Completed: Medical issues which prohibited therapy;Fatigue/lethargy limiting ability to participate   Donavan Burnetamara Sugey Trevathan, MS [facility] SLP [phone]

## 2017-07-29 ENCOUNTER — Inpatient Hospital Stay (HOSPITAL_COMMUNITY): Payer: Medicare Other

## 2017-07-29 LAB — GLUCOSE, CAPILLARY
GLUCOSE-CAPILLARY: 89 mg/dL (ref 65–99)
GLUCOSE-CAPILLARY: 86 mg/dL (ref 65–99)
Glucose-Capillary: 88 mg/dL (ref 65–99)
Glucose-Capillary: 84 mg/dL (ref 65–99)
Glucose-Capillary: 105 mg/dL — ABNORMAL HIGH (ref 65–99)
Glucose-Capillary: 111 mg/dL — ABNORMAL HIGH (ref 65–99)

## 2017-07-29 LAB — CULTURE, BLOOD (ROUTINE X 2)
Culture: NO GROWTH
Special Requests: ADEQUATE

## 2017-07-29 LAB — PREPARE RBC (CROSSMATCH)

## 2017-07-29 LAB — BASIC METABOLIC PANEL
ANION GAP: 9 (ref 5–15)
BUN: 53 mg/dL — AB (ref 6–20)
CHLORIDE: 108 mmol/L (ref 101–111)
CO2: 29 mmol/L (ref 22–32)
Calcium: 8.1 mg/dL — ABNORMAL LOW (ref 8.9–10.3)
Creatinine, Ser: 3.53 mg/dL — ABNORMAL HIGH (ref 0.44–1.00)
GFR calc Af Amer: 14 mL/min — ABNORMAL LOW (ref >60)
GFR, EST NON AFRICAN AMERICAN: 12 mL/min — AB (ref >60)
GLUCOSE: 92 mg/dL (ref 65–99)
POTASSIUM: 3 mmol/L — AB (ref 3.5–5.1)
Sodium: 146 mmol/L — ABNORMAL HIGH (ref 135–145)

## 2017-07-29 LAB — ABO/RH: ABO/RH(D): AB POS

## 2017-07-29 LAB — CBC
HCT: 22.5 % — ABNORMAL LOW (ref 36.0–46.0)
HEMOGLOBIN: 7.3 g/dL — AB (ref 12.0–15.0)
MCH: 28.5 pg (ref 26.0–34.0)
MCHC: 32.4 g/dL (ref 30.0–36.0)
MCV: 87.9 fL (ref 78.0–100.0)
Platelets: 67 10*3/uL — ABNORMAL LOW (ref 150–400)
RBC: 2.56 MIL/uL — AB (ref 3.87–5.11)
RDW: 20.3 % — ABNORMAL HIGH (ref 11.5–15.5)
WBC: 5.5 10*3/uL (ref 4.0–10.5)

## 2017-07-29 LAB — MAGNESIUM: MAGNESIUM: 2 mg/dL (ref 1.7–2.4)

## 2017-07-29 LAB — PHOSPHORUS: Phosphorus: 3.4 mg/dL (ref 2.5–4.6)

## 2017-07-29 LAB — VANCOMYCIN, RANDOM: Vancomycin Rm: 23

## 2017-07-29 MED ORDER — POTASSIUM CHLORIDE 10 MEQ/50ML IV SOLN
10.0000 meq | INTRAVENOUS | Status: AC
  Administered 2017-07-29 (×4): 10 meq via INTRAVENOUS
  Filled 2017-07-29 (×4): qty 50

## 2017-07-29 MED ORDER — CHLORHEXIDINE GLUCONATE CLOTH 2 % EX PADS
6.0000 | MEDICATED_PAD | Freq: Every day | CUTANEOUS | Status: DC
  Administered 2017-07-30 – 2017-08-05 (×6): 6 via TOPICAL

## 2017-07-29 MED ORDER — RESOURCE THICKENUP CLEAR PO POWD
ORAL | Status: DC | PRN
  Filled 2017-07-29: qty 125

## 2017-07-29 MED ORDER — SODIUM CHLORIDE 0.9% FLUSH
10.0000 mL | Freq: Two times a day (BID) | INTRAVENOUS | Status: DC
  Administered 2017-07-30 – 2017-08-05 (×4): 10 mL

## 2017-07-29 MED ORDER — SODIUM CHLORIDE 0.9 % IV SOLN
Freq: Once | INTRAVENOUS | Status: AC
  Administered 2017-07-29: 17:00:00 via INTRAVENOUS

## 2017-07-29 MED ORDER — SODIUM CHLORIDE 0.9% FLUSH
10.0000 mL | INTRAVENOUS | Status: DC | PRN
Start: 2017-07-29 — End: 2017-08-05
  Administered 2017-07-31: 10 mL
  Administered 2017-08-03 – 2017-08-04 (×2): 20 mL
  Filled 2017-07-29 (×3): qty 40

## 2017-07-29 MED ORDER — DEXTROSE-NACL 5-0.45 % IV SOLN
INTRAVENOUS | Status: DC
  Administered 2017-07-29 – 2017-08-04 (×5): via INTRAVENOUS

## 2017-07-29 NOTE — Progress Notes (Signed)
PT Cancellation Note  Patient Details Name: Katie Evans MRN: [id number] DOB: March 17, 1946   Cancelled Treatment:    Reason Eval/Treat Not Completed: Patient not medically ready (BP remains low, HGB 7.3)   Rada HayHill, Charvi Gammage Elizabeth 07/29/2017, 11:54 AM Blanchard KelchKaren Delano Frate PT [phone]

## 2017-07-29 NOTE — Care Management Note (Signed)
Case Management Note  Patient Details  Name: [name] MRN: [id number] Date of Birth: [birth year]  Subjective/Objective:                  Mental status is improving Pressors are weaning down.  Action/Plan: Date:  July 29, 2017 Chart reviewed for concurrent status and case management needs. Will continue to follow patient progress. Discharge Planning: following for needs Expected discharge date: [id number] Marcelle SmilingRhonda Davis, BSN, [place]3, [place]   [phone]  Expected Discharge Date:   (UNKNOWN)               Expected Discharge Plan:  Skilled Nursing Facility  In-House Referral:  Clinical Social Work  Discharge planning Services  CM Consult  Post Acute Care Choice:    Choice offered to:     DME Arranged:    DME Agency:     HH Arranged:    HH Agency:     Status of Service:  In process, will continue to follow  If discussed at Long Length of Stay Meetings, dates discussed:    Additional Comments:  Golda AcreDavis, Rhonda Lynn, RN 07/29/2017, 9:03 AM

## 2017-07-29 NOTE — Progress Notes (Signed)
eLink Physician-Brief Progress Note Patient Name: Katie Evans DOB: March 20, 1946 MRN: [id number]   Date of Service  07/29/2017  HPI/Events of Note  K+ = 3.0 and Creatinine = 3.53.  eICU Interventions  Will replace K+.     Intervention Category Major Interventions: Electrolyte abnormality - evaluation and management  Sommer,Steven Eugene 07/29/2017, 6:09 AM

## 2017-07-29 NOTE — Progress Notes (Signed)
Modified Barium Swallow Progress Note  Patient Details  Name: Katie Evans MRN: [id number] Date of Birth: [birth year]  Today's Date: 07/29/2017  Modified Barium Swallow completed.  Full report located under Chart Review in the Imaging Section.  Brief recommendations include the following:  Clinical Impression  Pt presents with moderate oropharyngeal dysphagia with sensorimotor deficits.   Significant weakness results in oral transiting delay, decreased propulsion, premature spillage and oral residuals.  Oral residuals inconsistently were swallowed despite cues.  Patient benefited from use of dry spoon and/or minimal amount of liquid on spoon to help elicit swallow.    Secretions retained in oropharynx mixed with barium - ? if contributed to gag occuring approximately four times during session.    Pharyngeal swallow marginally weak resulting in worse residuals with puree vs liquids without pt awareness.  Trace aspiration x1 with thin via cup noted that elicited weak cough response.  Pt will be a nutrition and aspiration risk due to her dysphagia - however also suspect she will not tolerate feeding tube due to excessive gag.    Recommend nectar liquids via TSP only with strict precautions.  SLP to follow up for readiness for dietary advancement and family/pt family education.      Swallow Evaluation Recommendations       SLP Diet Recommendations: Nectar thick liquid (full liquids via tsp)   Liquid Administration via: Spoon   Medication Administration: Crushed with puree   Supervision: Full supervision/cueing for compensatory strategies   Compensations: Slow rate;Small sips/bites   Postural Changes: Seated upright at 90 degrees;Remain semi-upright after after feeds/meals (Comment)       Other Recommendations: Have oral suction available;Order thickener from pharmacy   Katie Burnetamara Evelina Lore, MS [facility] SLP [phone]  Katie Evans, Katie Evans 07/29/2017,3:38 PM

## 2017-07-29 NOTE — Progress Notes (Signed)
OT Cancellation Note  Patient Details Name: Berneda Roseamela Rissmiller MRN: [id number] DOB: 10-04-1946   Cancelled Treatment:      Noted pt from a SNF and returning to SNF- [facility] [place], [place] [phone] Einar CrowEDDING, Zamyia Gowell D 07/29/2017, 4:24 PM

## 2017-07-29 NOTE — NC FL2 (Signed)
[place] MEDICAID FL2 LEVEL OF CARE SCREENING TOOL     IDENTIFICATION  Patient Name: Katie Evans Birthdate: 06-Jul-1946 Sex: female Admission Date (Current Location): 07/24/2017  [facility]   Facility and Address:  [facility],  [address], [place] [id number]      Provider Number: [id number]  Attending Physician Name and Address:  Alison Murrayevine, Alma M, MD  Relative Name and Phone Number:       Current Level of Care: Hospital Recommended Level of Care: Skilled Nursing Facility Prior Approval Number:    Date Approved/Denied:   PASRR Number:    Discharge Plan: SNF    Current Diagnoses: Patient Active Problem List   Diagnosis Date Noted  . Septic shock (HCC)   . AKI (acute kidney injury) (HCC) 07/24/2017  . Obstipation 07/24/2017  . Acute metabolic encephalopathy 07/24/2017  . Hypotension 07/24/2017  . Hypernatremia 07/24/2017  . Hyperkalemia 07/24/2017  . Pressure injury of skin 07/24/2017  . Obesity 07/11/2016  . Anemia of chronic disease 07/11/2016  . Long term current use of anticoagulant therapy 07/11/2016  . Protein-calorie malnutrition (HCC) 03/04/2015  . GAD (generalized anxiety disorder) 03/04/2015  . Ulcer of sacral region, stage 2 (HCC) 03/04/2015  . Hyperlipidemia 03/04/2015  . Chronic pulmonary embolism (HCC) 03/04/2015  . Congestive heart disease (HCC) 03/04/2015  . Anemia, iron deficiency 10/11/2014  . Arthritis 10/11/2014  . Restless leg syndrome 10/11/2014  . Major depression, chronic (HCC) 10/11/2014  . Hyperthyroidism 10/11/2014  . Thrombocytopenia (HCC) 10/07/2014  . Diastolic CHF, acute on chronic (HCC) 10/05/2014  . Pulmonary hypertension (HCC) 09/24/2014  . Essential hypertension 11/02/2013    Orientation RESPIRATION BLADDER Height & Weight     Self  O2 Indwelling catheter Weight: 84.8 kg (187 lb) Height:  5\' 4"  (162.6 cm)  BEHAVIORAL SYMPTOMS/MOOD NEUROLOGICAL BOWEL NUTRITION STATUS   Incontinent (rectual tube in place)  (NPO for now. Check dc summary. )  AMBULATORY STATUS COMMUNICATION OF NEEDS Skin   Total Care Verbally Other (Comment) (Stage 2 pressure ulcers on sacrum,  thigh)                       Personal Care Assistance Level of Assistance  Bathing, Feeding, Dressing Bathing Assistance: Maximum assistance Feeding assistance: Maximum assistance Dressing Assistance: Maximum assistance     Functional Limitations Info  Sight, Hearing, Speech Sight Info: Adequate Hearing Info: Adequate Speech Info: Adequate    SPECIAL CARE FACTORS FREQUENCY                       Contractures Contractures Info: Not present    Additional Factors Info  Code Status, Allergies, Isolation Precautions Code Status Info: DNR Allergies Info: remeron     Isolation Precautions Info: 07/24/17 + MRSA pcr     Current Medications (07/29/2017):  This is the current hospital active medication list Current Facility-Administered Medications  Medication Dose Route Frequency Provider Last Rate Last Dose  . 0.9 %  sodium chloride infusion   Intra-arterial PRN Schorr, Roma KayserKatherine P, NP      . 0.9 %  sodium chloride infusion   Intravenous Once Mannam, Praveen, MD      . acetaminophen (TYLENOL) tablet 650 mg  650 mg Oral Q6H PRN Marinda ElkFeliz Ortiz, Abraham, MD       Or  . acetaminophen (TYLENOL) suppository 650 mg  650 mg Rectal Q6H PRN Marinda ElkFeliz Ortiz, Abraham, MD      . chlorhexidine (  PERIDEX) 0.12 % solution 15 mL  15 mL Mouth Rinse BID Marinda Elk, MD   15 mL at 07/29/17 1047  . dextrose 5 %-0.45 % sodium chloride infusion   Intravenous Continuous Mannam, Praveen, MD 50 mL/hr at 07/29/17 1248    . enoxaparin (LOVENOX) injection 30 mg  30 mg Subcutaneous Q24H Marinda Elk, MD   30 mg at 07/28/17 1713  . insulin aspart (novoLOG) injection 1-3 Units  1-3 Units Subcutaneous Q4H Kalman Shan, MD   1 Units at 07/28/17 1234  . lidocaine (XYLOCAINE) 2 % viscous mouth solution 15  mL  15 mL Mouth/Throat TID Marinda Elk, MD   15 mL at 07/29/17 1046  . MEDLINE mouth rinse  15 mL Mouth Rinse BID Marinda Elk, MD   15 mL at 07/28/17 2143  . meropenem (MERREM) 1 g in sodium chloride 0.9 % 100 mL IVPB  1 g Intravenous Q12H Lucia Gaskins, RPH   Stopped at 07/29/17 0344  . methimazole (TAPAZOLE) tablet 5 mg  5 mg Oral Daily Marinda Elk, MD      . norepinephrine (LEVOPHED) 16 mg in dextrose 5 % 250 mL (0.064 mg/mL) infusion  0-40 mcg/min Intravenous Titrated Merwyn Katos, MD 4.7 mL/hr at 07/29/17 1253 5 mcg/min at 07/29/17 1253  . pantoprazole (PROTONIX) injection 40 mg  40 mg Intravenous Daily Duayne Cal, NP   40 mg at 07/29/17 1048  . potassium chloride 10 mEq in 50 mL *CENTRAL LINE* IVPB  10 mEq Intravenous Q1 Hr x 4 Karl Ito, MD 50 mL/hr at 07/29/17 1248 10 mEq at 07/29/17 1248     Discharge Medications: Please see discharge summary for a list of discharge medications.  Relevant Imaging Results:  Relevant Lab Results:   Additional Information SS # [id number]  Kenniel Bergsma, Dickey Gave, LCSW

## 2017-07-29 NOTE — Progress Notes (Signed)
CSW consulted to assist with dc planning. [facility] Due to medical status pt isn't able to participate in dc planning at this time. CSW has left a VM for pt's sister, Anabel HalonDana Taylor, HCPOA. Awaiting return call to assist with dc planning needs.  Cori RazorJamie Sundeep Destin LCSW [phone]

## 2017-07-29 NOTE — Progress Notes (Signed)
Pharmacy Antibiotic Note  Katie Roseamela Evans is a 71 y.o. female [facility] with AMS.  Broad abx were started on admission for UTI/PNA/sepsis. UCX on 8/29 with ESBL ecoli --change zosyn to merrem on 9/1.  Today, 07/29/2017: -hypothermic, wbc WNL at 5.5 - scr elevated but trending down 3.53  (crcl~15), on bicarb drip 8/30-9/1  - random vancomycin level 23 (~ 45.5 hrs after 1 gm dose), not ready for another dose  Plan: - continue merrem 1gm IV q12h - will re-check another vancomycin level with AM labs and redose if < 20 - f/u renal function - vancomycin to be stopped if repeat BCx negative  __________________________________  Height: 5\' 4"  (162.6 cm) Weight: 187 lb (84.8 kg) IBW/kg (Calculated) : 54.7  Temp (24hrs), Avg:96.9 F (36.1 C), Min:96.1 F (35.6 C), Max:97.7 F (36.5 C)   Recent Labs Lab 07/24/17 1117  07/25/17 0500  07/25/17 2240 07/26/17 0315 07/26/17 1845  07/27/17 0500 07/28/17 0330 07/28/17 1228 07/29/17 0500  WBC  --   < > 17.7*  --   --  10.7* 9.6  --   --  8.7  --  5.5  CREATININE  --   < >  --   < > 4.84* 4.75*  --   --  4.23* 3.68*  --  3.53*  LATICACIDVEN 1.73  --   --   --   --   --   --   --   --   --   --   --   VANCORANDOM  --   --   --   --   --   --   --   < > 15  --  28 23  < > = values in this interval not displayed.  Estimated Creatinine Clearance: 16.1 mL/min (A) (by C-G formula based on SCr of 3.53 mg/dL (H)).    Allergies  Allergen Reactions  . Remeron [Mirtazapine]     Per MAR. No rxn listed   Antimicrobials this admission: 8/30 Vancomycin >> 8/29 Zosyn >> 9/1 9/1 merrem>>  Dose adjustments this admission: 9/31 at 0500 VT=  15 (1 gm x1 given on 8/30 at 0617) -- redose 1gm x1 at 0720 9/2 VR at 1228= 28 (29 hrs after 1gm dose) 9/3 VT at 0500 = 23 (45/5 hrs after 1 gm dose)   Microbiology results: 8/29 BCx x2: 1/4 bottles with GPC in clusters (BCID= CoNS, MR)->? contaminant 8/29  UCx (not  collected prior to 1st dose abx): 20K ESBL ecoli FINAL 8/29 MRSA PCR (+) 8/31 cdiff PCR (-) 9/2 BCx2>>    Thank you for allowing pharmacy to be a part of this patient's care.  Herby AbrahamMichelle T. Avry Roedl, Pharm.D. [phone] 07/29/2017 9:19 AM

## 2017-07-29 NOTE — Clinical Social Work Note (Signed)
Clinical Social Work Assessment  Patient Details  Name: Katie Evans MRN: [id number] Date of Birth: [birth year]  Date of referral:  07/29/17               Reason for consult:  Facility Placement, Discharge Planning                Permission sought to share information with:  Facility Art therapist granted to share information::  Yes, Verbal Permission Granted  Name::        Agency::     Relationship::     Contact Information:     Housing/Transportation Living arrangements for the past 2 months:  [place] of Information:    Patient Interpreter Needed:  None (HCPOA : sister) Criminal Activity/Legal Involvement Pertinent to Current Situation/Hospitalization:  No - Comment as needed Significant Relationships:  Siblings, Parents Lives with:  Facility Resident Do you feel safe going back to the place where you live?  Yes Need for family participation in patient care:  Yes (Comment)  Care giving concerns: HCPOA has some concerns regarding pt's care at SNF and will review concerns with SNF. Ombudsman info provided.  Social Worker assessment / plan:  Pt hospitalized from [facility] on 07/24/17 with Acute metabolic encephalopathy. CSW met with pt's sister / Katie Evans, Harrold Donath, to assist with dc planning. HCPOA reports that pt will return to [facility] [place]. Clinicals have been sent to SNF for review. VM left for Admissions [facility] to confirm dc plan. Awaiting return call. CSW will continue to follow to assist with dc planning needs.  Employment status:  Retired Nurse, adult, Medicaid In [place] PT Recommendations:  Not assessed at this time Information / Referral to community resources:     Patient/Family's Response to care:  HCPOA expects pt to return to SNF at dc.   Patient/Family's Understanding of and Emotional Response to Diagnosis, Current Treatment, and Prognosis:  HCPOA is aware of pt's medical status and  reports that continued SNF placement is needed. HCPOA assists both pt and parent that is in LTC at [place]. Support provided.  Emotional Assessment Appearance:  Appears stated age Attitude/Demeanor/Rapport:  Unable to Assess Affect (typically observed):  Unable to Assess Orientation:  Oriented to Self Alcohol / Substance use:  Not Applicable Psych involvement (Current and /or in the community):  No (Comment)  Discharge Needs  Concerns to be addressed:  Discharge Planning Concerns Readmission within the last 30 days:  No Current discharge risk:  None Barriers to Discharge:  No Barriers Identified   Luretha Rued, [place] 07/29/2017, 12:19 PM

## 2017-07-29 NOTE — Progress Notes (Signed)
OT Cancellation Note  Patient Details Name: Katie Evans MRN: [id number] DOB: [birth year]   Cancelled Treatment:    Reason Eval/Treat Not Completed: Medical issues which prohibited therapy  Noted low hemoglobin and low BP. Will recheck on pt next day Katie Evans, [place] [phone]  Katie Evans, Katie Evans D 07/29/2017, 11:43 AM

## 2017-07-29 NOTE — Progress Notes (Addendum)
  Speech Language Pathology Treatment: Dysphagia  Patient Details Name: Katie Evans MRN: [id number] DOB: 04/26/1946 Today's Date: 07/29/2017 Time: [id number] SLP Time Calculation (min) (ACUTE ONLY): 49 min  Assessment / Plan / Recommendation Clinical Impression  Today pt required extensive oral care prior to being able to be offered po intake.  She has an excessively sensitive gag and doubt she will tolerate short term feeding tube.  She did allow oral care but with sensitive gag, oral care is laborious with gagging approximately 25% of oral care.  Pt's lack of awareness to oral secretions is indication of mentation/oral swallowing.  Ice chips, puree and nectar provided with pt demonstrating delayed swallow.  Voice is dysphonic with weak cough x2 of approximately 10 boluses noted.    As pt is eliciting swallow today and concern is present for nutrition/hydration,will plan MBS today.  Suspect cognitive and sensorimotor based dysphagia due to her Parkinsonism, deconditioning and potential cva.  Informed pt to plan - to which pt winked at SLP.  RN and xray aware of plan.           HPI HPI: Katie Loramela Slaydonis a 71 y.o.femalewith medical history significant of past medical history of chronic renal disease stage III, chronic heart failure, chronic pulmonary embolism on Xarelto, major depressive disorder is [facility] status. Found to have septic shock secondary to  UTI, acute renal failure, hyponatremia, metabolic acidosis and GPC bacteremia. CXR stable appearance of left lower lobar pneumonia, small left pleural effusion. Pt has been seen for swallowing evaluation and follow up indicated to determine readiness for po.  Pt has been NPO since admission and per RN, pt was not eating well prior to admission.       SLP Plan          Recommendations  Diet recommendations: NPO Medication Administration: Via alternative means                Oral Care  Recommendations: Oral care QID Follow up Recommendations: Skilled Nursing facility SLP Visit Diagnosis: Dysphagia, oropharyngeal phase (R13.12)       GO               Katie Burnetamara Lexander Tremblay, MS [facility] SLP [phone]  Katie Evans, Katie Evans 07/29/2017, 1:35 PM

## 2017-07-29 NOTE — Progress Notes (Signed)
PULMONARY / CRITICAL CARE MEDICINE   Name: Katie Evans MRN: [id number] DOB: Sep 23, 1946    ADMISSION DATE:  07/24/2017 CONSULTATION DATE:  8/30  REFERRING MD:  Dr. David Stall  CHIEF COMPLAINT:  shock  HISTORY OF PRESENT ILLNESS:   71 year old female with PMH as below, which is significant for parkinson's, CHF, COPD, GERD, HTN, Hyperthyroid on methimazole, and Xarelto for PE. She has a long history of prescription drug abuse, overdoses, and non-compliance with medical therapies, which have ultimately lead to what is essentially failure to thrive. She resides in SNF where she was noted to have altered mental status for about 1 month by her sister. In June she was able to get around the SNF and use her cell phone and tablet. Daughter feels that something abrupt happened and that all changed. This was progressive until 8/29 when the patient's daughter demanded that she be sent to the ER where she was felt to be extremely dehydrated. She was provided with aggressive IVF resuscitation, and admitted to the hospitalist team for renal failure, encephalopathy, and possible UTI. CT scan of head concerning for CVA as well. She became hypotensive, which persisted despite IVF resuscitation and was transferred to ICU for pressor support. PCCM consulted.   SUBJECTIVE:  Mental status is improving. Still on levophed  VITAL SIGNS: BP (!) 149/94 (BP Location: Right Arm)   Pulse 65   Temp (!) 96.3 F (35.7 C)   Resp (!) 22   Ht 5\' 4"  (1.626 m)   Wt 187 lb (84.8 kg)   SpO2 98%   BMI 32.10 kg/m   HEMODYNAMICS: CVP:  [3 mmHg-7 mmHg] 5 mmHg  VENTILATOR SETTINGS:    INTAKE / OUTPUT: I/O last 3 completed shifts: In: 2659.9 [I.V.:1956.6; IV Piggyback:703.3] Out: 2045 [Urine:1945; Stool:100]  PHYSICAL EXAMINATION: Gen:      No acute distress, chronically ill appearing HEENT:  EOMI, sclera anicteric Neck:     No masses; no thyromegaly Lungs:    Clear to auscultation bilaterally; normal respiratory  effort CV:         Regular rate and rhythm; no murmurs Abd:      + bowel sounds; soft, non-tender; no palpable masses, no distension Ext:    No edema; adequate peripheral perfusion Skin:      Warm and dry; no rash Neuro: Awake, speaks with soft voice. No focal deficits  LABS:  BMET  Recent Labs Lab 07/27/17 0500 07/28/17 0330 07/29/17 0500  NA 147* 145 146*  K 2.4* 2.8* 3.0*  CL 108 105 108  CO2 26 30 29   BUN 60* 55* 53*  CREATININE 4.23* 3.68* 3.53*  GLUCOSE 116* 101* 92    Electrolytes  Recent Labs Lab 07/26/17 0315 07/27/17 0500 07/28/17 0330 07/29/17 0500  CALCIUM 8.7* 8.3* 8.1* 8.1*  MG 1.2*  --  1.0* 2.0  PHOS 2.3*  --  2.3* 3.4    CBC  Recent Labs Lab 07/26/17 1845 07/28/17 0330 07/29/17 0500  WBC 9.6 8.7 5.5  HGB 7.6* 7.8* 7.3*  HCT 23.3* 24.0* 22.5*  PLT 91* 85* 67*    Coag's  Recent Labs Lab 07/24/17 1057 07/26/17 0315  APTT  --  34  INR 2.13 1.38    Sepsis Markers  Recent Labs Lab 07/24/17 1117  LATICACIDVEN 1.73    ABG  Recent Labs Lab 07/25/17 0400 07/25/17 1156 07/26/17 0805  PHART 7.229* 7.377 7.478*  PCO2ART 22.2* 22.4* 28.1*  PO2ART 92.5 60.1* 76.2*    Liver Enzymes  Recent Labs Lab 07/24/17 1057  AST 19  ALT 11*  ALKPHOS 115  BILITOT 1.0  ALBUMIN 2.5*    Cardiac Enzymes  Recent Labs Lab 07/24/17 1057  TROPONINI 0.08*    Glucose  Recent Labs Lab 07/28/17 1640 07/28/17 1942 07/28/17 2323 07/29/17 0328 07/29/17 0744 07/29/17 1120  GLUCAP 119* 96 96 89 88 84    Imaging No results found. STUDIES:  CT head 8/29 > Possible moderate acute infarct in the lateral left frontal lobe. Certainty is diminished by artifact from patient positioning. Chronic microvascular ischemia. Chronic left maxillary sinusitis. CT abd 8/29 > No acute intra-abdominal process. Large stool ball in the rectum.  Correlate for fecal impaction. Small amount of patchy centrilobular nodularity in the left lower lobe,  which could reflect aspiration. 8/31 renal us>> neg EEG 8/31 > slowing MRI 8/31 > no acute abnornmality, chronic microvascular changes, atrophy  CULTURES: Blood 8/29 > 1/2 CNS Urine 8/30 > E coli (ESBL)  ANTIBIOTICS: Zosyn 8/29 > 9/1 Vanco 8/30 >  Meropenem 9/1 >  SIGNIFICANT EVENTS: 8/29 admit 8/30 tx to ICU for pressors.   LINES/TUBES: RIJ CVL 8/30 >  DISCUSSION: 71 year old female who lives in SNF after life long non-compliance and prescription medication abuse lead essentially to failure to thrive. Progressively worsening mental status since June per sister. Admitted 8/29 with AMS and renal failure. Escherichia coli urosepsis  ASSESSMENT / PLAN:  PULMONARY A: Risk poor airway protection P:   Monitor Supplemental oxygen  CARDIOVASCULAR A:  Shock, likely septic, cvp 8 Chronic systolic/diastolic CHF Mild troponin elevation, likely demand H/o HTN  P:  Telemetry  Wean down Levophed Will give 1 unit blood to try and get pressors off.   RENAL A:   AKI- better Hyperchloremia Hypernatremia Improved Mixed metabolic acidosis  P:   Replete lytes  GASTROINTESTINAL A:   GERD Malnourished P:   NPO Protonix for SUP Repeat speech eval today. Will need cortrack if she fails D5 infusion  HEMATOLOGIC A:   Anemia. No active bleed  P:  Trend CBC Sq enoxaparin for VTE ppx  Transfuse  INFECTIOUS A:   Septic shock, e coli CNS - likely contaminant  P:   On meropenem for esbl Follow repeat Bcx. If negative we can stop vanco  ENDOCRINE A:   Hyperthyroid   TSH wnl  P:   Monitor glucose on chem panel Holding methimazole  NEUROLOGIC A:   Acute encephalopathy metabolic component, No CVA on MRI. Shows microvascular changes and chronic atrophy EEG is OK  P:   Close monitoring  FAMILY  - Updates: sister [facility](HCPOA Anabel HalonDana Taylor) updated. She is very realistic and feels as though her sisters quality of life has not been good for some time. She feels as  though nursing home neglected to care appropriately for her sister and wants to be conservative with care moving forward. Ok with aggressive medical therapies, but is very wary of treatments that would negatively effect quality of life like dialysis.   Updated sisters daily. They are worried about placement for her and are stressed about taking care of her and mother who is in 90s. Will need to work with social work to determine placement as she would not like to go back to [facility].  - Inter-disciplinary family meet or Palliative Care meeting due by:  9/5  The patient is critically ill with multiple organ system failure and requires high complexity decision making for assessment and support, frequent evaluation and titration of therapies,  advanced monitoring, review of radiographic studies and interpretation of complex data.   Critical Care Time devoted to patient care services, exclusive of separately billable procedures, described in this note is 35 minutes.   Chilton Greathouse MD [facility] Pager [phone] If no answer or after 3pm call: [phone] 07/29/2017, 12:24 PM

## 2017-07-30 LAB — CBC
HEMATOCRIT: 25.1 % — AB (ref 36.0–46.0)
Hemoglobin: 8.4 g/dL — ABNORMAL LOW (ref 12.0–15.0)
MCH: 29.4 pg (ref 26.0–34.0)
MCHC: 33.5 g/dL (ref 30.0–36.0)
MCV: 87.8 fL (ref 78.0–100.0)
Platelets: 46 10*3/uL — ABNORMAL LOW (ref 150–400)
RBC: 2.86 MIL/uL — ABNORMAL LOW (ref 3.87–5.11)
RDW: 19.2 % — AB (ref 11.5–15.5)
WBC: 4.1 10*3/uL (ref 4.0–10.5)

## 2017-07-30 LAB — TYPE AND SCREEN
ABO/RH(D): AB POS
ANTIBODY SCREEN: NEGATIVE
Unit division: 0

## 2017-07-30 LAB — PHOSPHORUS: Phosphorus: 3.7 mg/dL (ref 2.5–4.6)

## 2017-07-30 LAB — BPAM RBC
Blood Product Expiration Date: 201809222359
ISSUE DATE / TIME: 201809031616
UNIT TYPE AND RH: 6200

## 2017-07-30 LAB — BASIC METABOLIC PANEL
ANION GAP: 9 (ref 5–15)
BUN: 49 mg/dL — ABNORMAL HIGH (ref 6–20)
CALCIUM: 8 mg/dL — AB (ref 8.9–10.3)
CO2: 27 mmol/L (ref 22–32)
CREATININE: 3.33 mg/dL — AB (ref 0.44–1.00)
Chloride: 110 mmol/L (ref 101–111)
GFR calc Af Amer: 15 mL/min — ABNORMAL LOW (ref >60)
GFR calc non Af Amer: 13 mL/min — ABNORMAL LOW (ref >60)
GLUCOSE: 90 mg/dL (ref 65–99)
Potassium: 2.7 mmol/L — CL (ref 3.5–5.1)
Sodium: 146 mmol/L — ABNORMAL HIGH (ref 135–145)

## 2017-07-30 LAB — GLUCOSE, CAPILLARY
GLUCOSE-CAPILLARY: 82 mg/dL (ref 65–99)
GLUCOSE-CAPILLARY: 82 mg/dL (ref 65–99)
GLUCOSE-CAPILLARY: 93 mg/dL (ref 65–99)
Glucose-Capillary: 98 mg/dL (ref 65–99)
Glucose-Capillary: 97 mg/dL (ref 65–99)

## 2017-07-30 LAB — VANCOMYCIN, RANDOM: Vancomycin Rm: 20

## 2017-07-30 LAB — MAGNESIUM: Magnesium: 1.8 mg/dL (ref 1.7–2.4)

## 2017-07-30 MED ORDER — MAGNESIUM SULFATE 2 GM/50ML IV SOLN
2.0000 g | Freq: Once | INTRAVENOUS | Status: AC
Start: 2017-07-30 — End: 2017-07-30
  Administered 2017-07-30: 2 g via INTRAVENOUS
  Filled 2017-07-30: qty 50

## 2017-07-30 MED ORDER — FAMOTIDINE 20 MG PO TABS
20.0000 mg | ORAL_TABLET | Freq: Every day | ORAL | Status: DC
  Administered 2017-08-01: 20 mg via ORAL
  Filled 2017-07-30: qty 1

## 2017-07-30 MED ORDER — INSULIN ASPART 100 UNIT/ML ~~LOC~~ SOLN
0.0000 [IU] | Freq: Three times a day (TID) | SUBCUTANEOUS | Status: DC
Start: 2017-07-30 — End: 2017-08-05

## 2017-07-30 MED ORDER — VANCOMYCIN HCL IN DEXTROSE 1-5 GM/200ML-% IV SOLN
1000.0000 mg | Freq: Once | INTRAVENOUS | Status: AC
  Administered 2017-07-30: 1000 mg via INTRAVENOUS
  Filled 2017-07-30: qty 200

## 2017-07-30 MED ORDER — INSULIN ASPART 100 UNIT/ML ~~LOC~~ SOLN: 0.0000 [IU] | Freq: Every day | SUBCUTANEOUS | Status: DC

## 2017-07-30 MED ORDER — POTASSIUM CHLORIDE CRYS ER 20 MEQ PO TBCR
40.0000 meq | EXTENDED_RELEASE_TABLET | Freq: Two times a day (BID) | ORAL | Status: DC
  Administered 2017-07-30: 40 meq via ORAL
  Filled 2017-07-30: qty 2

## 2017-07-30 MED ORDER — FAMOTIDINE 20 MG PO TABS
20.0000 mg | ORAL_TABLET | Freq: Two times a day (BID) | ORAL | Status: DC
  Administered 2017-07-30: 20 mg via ORAL
  Filled 2017-07-30: qty 1

## 2017-07-30 NOTE — Progress Notes (Signed)
Received pt from ICU. Pts assessment is unchanged from morning assessment. Will continue to monitor.

## 2017-07-30 NOTE — Progress Notes (Signed)
  Speech Language Pathology Treatment: Dysphagia  Patient Details Name: Katie Evans MRN: [id number] DOB: July 07, 1946 Today's Date: 07/30/2017 Time: [id number] SLP Time Calculation (min) (ACUTE ONLY): 15 min  Assessment / Plan / Recommendation Clinical Impression  SLP administered trials of nectar thick liquids by spoon utilizing swallowing precautions as recommended on previous date. Pt intermittently initiates a pharyngeal swallow in responses to boluses, and requires Max verbal/tactile cues to elicit one if not triggered spontaneously. Coughing is observed on approximately 75% of trials, and RN reports frequent coughing with POs. Upon inspection of suction tubing, there appears to be a combination of secretions and potentially applesauce present. Pt does not appear to be tolerating trials of POs, therefore recommendation would be for her to be made NPO. Although there has been documentation of concern for pt's ability to tolerate an NGT due to a sensitive gag reflex, I think that she is actively aspirating even small amounts of POs with weak functional reserve to be able to tolerate this. Recommendations discussed with MD who is in agreement with pt changing to NPO status. Will continue to follow.   HPI HPI: Katie Loramela Slaydonis a 71 y.o.femalewith medical history significant of past medical history of chronic renal disease stage III, chronic heart failure, ? 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 Found to have septic shock secondary to  UTI, acute renal failure, hyponatremia, metabolic acidosis and GPC bacteremia. CXR stable appearance of left lower lobar pneumonia, small left pleural effusion. Pt has been seen for swallowing evaluation and follow up indicated to determine readiness for po.  Pt has been NPO since admission and per RN, pt was not eating well prior to admission.       SLP Plan  Continue  with current plan of care       Recommendations  Diet recommendations: NPO Medication Administration: Via alternative means                Oral Care Recommendations: Oral care QID Follow up Recommendations: Skilled Nursing facility SLP Visit Diagnosis: Dysphagia, oropharyngeal phase (R13.12) Plan: Continue with current plan of care       GO                Katie Evans, Katie Evans 07/30/2017, 11:59 AM  Katie Evans, M.A. CCC-SLP [phone]

## 2017-07-30 NOTE — Progress Notes (Signed)
PULMONARY / CRITICAL CARE MEDICINE   Name: Katie Evans MRN: [id number] DOB: 03-21-1946    ADMISSION DATE:  07/24/2017 CONSULTATION DATE:  8/30  REFERRING MD:  Dr. David Stall  CHIEF COMPLAINT:  shock  BRIEF: 71 y/o female with CHF, COPD admitted from a SNF for confusion.  She was found to have septic shock from an e coli from a UTI.      SUBJECTIVE:  Conversant somewhat but still slow Off levophed  VITAL SIGNS: BP (!) 149/94 (BP Location: Right Arm)   Pulse (!) 57   Temp (!) 95.7 F (35.4 C)   Resp 18   Ht 5\' 4"  (1.626 m)   Wt 84.8 kg (187 lb)   SpO2 99%   BMI 32.10 kg/m   HEMODYNAMICS: CVP:  [5 mmHg-7 mmHg] 5 mmHg  VENTILATOR SETTINGS:    INTAKE / OUTPUT: I/O last 3 completed shifts: In: 3349.8 [I.V.:2317.8; Blood:482; IV Piggyback:550] Out: 2570 [Urine:2300; Stool:270]  PHYSICAL EXAMINATION:  General:  Chronically ill appearing resting comfortably in bed HENT: NCAT OP clear PULM: CTA B, normal effort CV: RRR, no mgr GI: BS+, soft, nontender Derm: thin skin, some bruising extensor surfaces and edema Neuro: awake, alert, no distress, MAEW but slow to speak   LABS:  BMET  Recent Labs Lab 07/28/17 0330 07/29/17 0500 07/30/17 0531  NA 145 146* 146*  K 2.8* 3.0* 2.7*  CL 105 108 110  CO2 30 29 27   BUN 55* 53* 49*  CREATININE 3.68* 3.53* 3.33*  GLUCOSE 101* 92 90    Electrolytes  Recent Labs Lab 07/28/17 0330 07/29/17 0500 07/30/17 0531  CALCIUM 8.1* 8.1* 8.0*  MG 1.0* 2.0 1.8  PHOS 2.3* 3.4 3.7    CBC  Recent Labs Lab 07/28/17 0330 07/29/17 0500 07/30/17 0531  WBC 8.7 5.5 4.1  HGB 7.8* 7.3* 8.4*  HCT 24.0* 22.5* 25.1*  PLT 85* 67* 46*    Coag's  Recent Labs Lab 07/24/17 1057 07/26/17 0315  APTT  --  34  INR 2.13 1.38    Sepsis Markers  Recent Labs Lab 07/24/17 1117  LATICACIDVEN 1.73    ABG  Recent Labs Lab 07/25/17 0400 07/25/17 1156 07/26/17 0805  PHART 7.229* 7.377 7.478*  PCO2ART 22.2* 22.4*  28.1*  PO2ART 92.5 60.1* 76.2*    Liver Enzymes  Recent Labs Lab 07/24/17 1057  AST 19  ALT 11*  ALKPHOS 115  BILITOT 1.0  ALBUMIN 2.5*    Cardiac Enzymes  Recent Labs Lab 07/24/17 1057  TROPONINI 0.08*    Glucose  Recent Labs Lab 07/29/17 1120 07/29/17 1615 07/29/17 1936 07/29/17 2322 07/30/17 0336 07/30/17 0758  GLUCAP 84 105* 111* 86 82 82    Imaging Dg Swallowing Func-speech Pathology  Result Date: 07/29/2017 Objective Swallowing Evaluation: Type of Study: MBS-Modified Barium Swallow Study Patient Details Name: Katie Evans MRN: [id number] Date of Birth: [birth year] Today's Date: 07/29/2017 Time: SLP Start Time (ACUTE ONLY): 1425-SLP Stop Time (ACUTE ONLY): 1459 SLP Time Calculation (min) (ACUTE ONLY): 34 min Past Medical History: Past Medical History: Diagnosis Date . Allergic rhinitis  . Anemia  . Anxiety  . Arthritis  . Bruising  . Carpal tunnel syndrome  . CHF (congestive heart failure) (HCC)  . Chronic diarrhea  . Chronic pulmonary embolism (HCC)  . Demand ischemia of myocardium (HCC)  . Flatulence  . Generalized anxiety disorder  . GERD without esophagitis  . Hemorrhoids  . Hyperlipidemia  . Hypertension  . Hyperthyroidism  . Hypokalemia  . Long term  current use of anticoagulant therapy  . Major depressive disorder  . Metabolic encephalopathy  . Overactive bladder  . Protein calorie malnutrition (HCC)  . Psychosis  . Renal disorder  . RLS (restless legs syndrome)  . Thrombocytopathia (HCC)  . Thrombocytopenia (HCC)  . Vaginal atrophy  Past Surgical History: Past Surgical History: Procedure Laterality Date . ABDOMINAL HYSTERECTOMY   . APPENDECTOMY   . BACK SURGERY   . GASTRIC BYPASS   . SHOULDER SURGERY   . SPINE SURGERY    spinal fusion . TONSILLECTOMY   HPI: Katie Evans a 71 y.o.femalewith medical history significant of past medical history of chronic renal disease stage III, chronic heart failure, ? parkinsonism, chronic pulmonary embolism on Xarelto, major  depressive disorder is sent here from [facility] for altered mental status. Found to have septic shock secondary to  UTI, acute renal failure, hyponatremia, metabolic acidosis and GPC bacteremia. CXR stable appearance of left lower lobar pneumonia, small left pleural effusion. Pt has been seen for swallowing evaluation and follow up indicated to determine readiness for po.  Pt has been NPO since admission and per RN, pt was not eating well prior to admission.  Subjective: pt awake in chair Assessment / Plan / Recommendation CHL IP CLINICAL IMPRESSIONS 07/29/2017 Clinical Impression Pt presents with moderate oropharyngeal dysphagia with sensorimotor deficits.   Significant weakness results in oral transiting delay, decreased propulsion, premature spillage and oral residuals.  Oral residuals inconsistently were swallowed despite cues.  Patient benefited from use of dry spoon and/or minimal amount of liquid on spoon to help elicit swallow.  Secretions retained in oropharynx mixed with barium - ? if contributed to gag occuring approximately four times during session.  Pharyngeal swallow marginally weak resulting in worse residuals with puree vs liquids without pt awareness.  Trace aspiration x1 with thin via cup noted that elicited weak cough response.  Pt will be a nutrition and aspiration risk due to her dysphagia - however also suspect she will not tolerate feeding tube due to excessive gag.  Recommend nectar liquids via TSP only with strict precautions.  SLP to follow up for readiness for dietary advancement and family/pt family education.    SLP Visit Diagnosis Dysphagia, oropharyngeal phase (R13.12) Attention and concentration deficit following -- Frontal lobe and executive function deficit following -- Impact on safety and function Moderate aspiration risk;Severe aspiration risk   CHL IP TREATMENT RECOMMENDATION 07/29/2017 Treatment Recommendations Therapy as outlined in treatment plan below   Prognosis 07/29/2017  Prognosis for Safe Diet Advancement Guarded Barriers to Reach Goals Time post onset;Severity of deficits;Cognitive deficits Barriers/Prognosis Comment -- CHL IP DIET RECOMMENDATION 07/29/2017 SLP Diet Recommendations Nectar thick liquid Liquid Administration via Spoon Medication Administration Crushed with puree Compensations Slow rate;Small sips/bites Postural Changes Seated upright at 90 degrees;Remain semi-upright after after feeds/meals (Comment)   CHL IP OTHER RECOMMENDATIONS 07/29/2017 Recommended Consults -- Oral Care Recommendations -- Other Recommendations Have oral suction available;Order thickener from pharmacy   CHL IP FOLLOW UP RECOMMENDATIONS 07/29/2017 Follow up Recommendations Skilled Nursing facility   [facility] IP FREQUENCY AND DURATION 07/29/2017 Speech Therapy Frequency (ACUTE ONLY) min 1 x/week Treatment Duration 1 week      CHL IP ORAL PHASE 07/29/2017 Oral Phase Impaired Oral - Pudding Teaspoon -- Oral - Pudding Cup -- Oral - Honey Teaspoon -- Oral - Honey Cup -- Oral - Nectar Teaspoon Impaired mastication;Reduced posterior propulsion;Decreased bolus cohesion;Delayed oral transit;Weak lingual manipulation Oral - Nectar Cup Weak lingual manipulation;Reduced posterior propulsion;Decreased bolus cohesion;Premature spillage;Delayed oral  transit;Lingual/palatal residue Oral - Nectar Straw Weak lingual manipulation Oral - Thin Teaspoon Weak lingual manipulation;Delayed oral transit;Decreased bolus cohesion;Lingual pumping;Reduced posterior propulsion;Lingual/palatal residue Oral - Thin Cup Weak lingual manipulation;Reduced posterior propulsion;Delayed oral transit;Decreased bolus cohesion;Premature spillage;Lingual/palatal residue Oral - Thin Straw -- Oral - Puree Weak lingual manipulation;Reduced posterior propulsion;Delayed oral transit;Decreased bolus cohesion;Premature spillage;Lingual/palatal residue Oral - Mech Soft -- Oral - Regular -- Oral - Multi-Consistency -- Oral - Pill -- Oral Phase - Comment --  CHL  IP PHARYNGEAL PHASE 07/29/2017 Pharyngeal Phase Impaired Pharyngeal- Pudding Teaspoon -- Pharyngeal -- Pharyngeal- Pudding Cup -- Pharyngeal -- Pharyngeal- Honey Teaspoon -- Pharyngeal -- Pharyngeal- Honey Cup -- Pharyngeal -- Pharyngeal- Nectar Teaspoon Delayed swallow initiation-vallecula;Delayed swallow initiation-pyriform sinuses;Pharyngeal residue - valleculae Pharyngeal -- Pharyngeal- Nectar Cup Delayed swallow initiation-vallecula;Pharyngeal residue - valleculae Pharyngeal -- Pharyngeal- Nectar Straw NT Pharyngeal -- Pharyngeal- Thin Teaspoon Delayed swallow initiation-vallecula;Delayed swallow initiation-pyriform sinuses;Pharyngeal residue - valleculae Pharyngeal -- Pharyngeal- Thin Cup Pharyngeal residue - valleculae;Delayed swallow initiation-pyriform sinuses;Trace aspiration;Penetration/Aspiration during swallow Pharyngeal Material enters airway, passes BELOW cords and not ejected out despite cough attempt by patient Pharyngeal- Thin Straw -- Pharyngeal -- Pharyngeal- Puree Delayed swallow initiation-vallecula;Reduced tongue base retraction;Pharyngeal residue - valleculae Pharyngeal -- Pharyngeal- Mechanical Soft -- Pharyngeal -- Pharyngeal- Regular -- Pharyngeal -- Pharyngeal- Multi-consistency -- Pharyngeal -- Pharyngeal- Pill -- Pharyngeal -- Pharyngeal Comment barium mixed with secretions retained in pharynx without pt awareness  CHL IP CERVICAL ESOPHAGEAL PHASE 07/29/2017 Cervical Esophageal Phase Impaired Pudding Teaspoon -- Pudding Cup -- Honey Teaspoon -- Honey Cup -- Nectar Teaspoon -- Nectar Cup -- Nectar Straw -- Thin Teaspoon -- Thin Cup -- Thin Straw -- Puree -- Mechanical Soft -- Regular -- Multi-consistency -- Pill -- Cervical Esophageal Comment appearance of minimal residuals at distal esopahgus without pt awareness, radiologist not present to confirm Donavan Burnetamara [name], MS [facility] SLP [phone] No flowsheet data found. Chales AbrahamsKimball, Tamara Ann 07/29/2017, 3:38 PM              STUDIES:  CT head 8/29 >  Possible moderate acute infarct in the lateral left frontal lobe. Certainty is diminished by artifact from patient positioning. Chronic microvascular ischemia. Chronic left maxillary sinusitis. CT abd 8/29 > No acute intra-abdominal process. Large stool ball in the rectum.  Correlate for fecal impaction. Small amount of patchy centrilobular nodularity in the left lower lobe, which could reflect aspiration. 8/31 renal us>> neg EEG 8/31 > slowing MRI 8/31 > no acute abnornmality, chronic microvascular changes, atrophy  CULTURES: Blood 8/29 > 1/2 CNS Urine 8/30 > E coli (ESBL) 9/3 blood >   ANTIBIOTICS: Zosyn 8/29 > 9/1 Vanco 8/30 > off Meropenem 9/1 >  SIGNIFICANT EVENTS: 8/29 admit 8/30 tx to ICU for pressors 9/3 SLP evaluation/barium swallow: moderate oropharyngeal dysphagia, rec nectar thick full liquid diet  LINES/TUBES: RIJ CVL 8/30 >  DISCUSSION: 71 year old female who lives in SNF after life long non-compliance and prescription medication abuse lead essentially to failure to thrive. Progressively worsening mental status since June per sister. Admitted 8/29 with AMS and renal failure. Escherichia coli urosepsis.  As of 9/3 her BP has improved  ASSESSMENT / PLAN:  PULMONARY A: No acute issues Aspiration risk P:   Monitor O2 saturation Aspiration precautions  CARDIOVASCULAR A:  Septic shock resolved > levophed off Chronic systolic/diastolic CHF Demand ischemia H/o HTN  P:  Tele Monitor off of levophed Monitor BP  RENAL A:   AKI improved slightly Hypokalemia Hypomagnesemia P:   Monitor BMET and UOP Replace electrolytes as  needed   GASTROINTESTINAL A:   GERD Severe protein calorie malnutrition Moderate oropharyngeal dysphagia P:   Nectar thick full liquid diet PPI> change   HEMATOLOGIC A:   Anemia without bleeding  P:  Trend CBC Monitor for bleeding Transfuse for Hgb < 7gm/dL  INFECTIOUS A:   Septic shock, e coli CNS - likely  contaminant  P:   Continue meropenem, will order PICC so she can receive this for 10 days  ENDOCRINE A:   Hyperthyroid   TSH wnl  P:   Continue methimaxole Monitor glucose  NEUROLOGIC A:   Acute encephalopathy improved: toxic metabolic No CVA on MRI. Shows microvascular changes and chronic atrophy EEG is OK  P:   No sedating medications.  FAMILY  - Updates: Discussion from Dr. Isaiah Serge:  sister [facility] Anabel Halon) updated. She is very realistic and feels as though her sisters quality of life has not been good for some time. She feels as though nursing home neglected to care appropriately for her sister and wants to be conservative with care moving forward. Ok with aggressive medical therapies, but is very wary of treatments that would negatively effect quality of life like dialysis.   Updated sisters daily. They are worried about placement for her and are stressed about taking care of her and mother who is in 90s. Will need to work with social work to determine placement as she would not like to go back to [facility].  - Inter-disciplinary family meet or Palliative Care meeting due by:  9/5   Heber Spencer, MD [place] [facility]M Pager: [phone] Cell: [phone] After 3pm or if no response, call [phone]

## 2017-07-30 NOTE — Progress Notes (Signed)
Pharmacy Antibiotic Note  Katie Evans is a 71 y.o. [facility].  Broad abx were started on admission for UTI/PNA/sepsis. UCX on 8/29 with ESBL ecoli --change zosyn to merrem on 9/1.  9/3 -hypothermic, wbc WNL at 5.5 - scr elevated but trending down 3.53  (crcl~15), on bicarb drip 8/30-9/1  - random vancomycin level 23 (~ 45.5 hrs after 1 gm dose), not ready for another dose Today, 9/4 - VancR=20, Scr pending  Plan: - continue merrem 1gm IV q12h - Give Vancomycin 1 Gm x1 today - f/u renal function - vancomycin to be stopped if repeat BCx negative  __________________________________  Height: 5\' 4"  (162.6 cm) Weight: 187 lb (84.8 kg) IBW/kg (Calculated) : 54.7  Temp (24hrs), Avg:96.5 F (35.8 C), Min:96.1 F (35.6 C), Max:97 F (36.1 C)   Recent Labs Lab 07/24/17 1117  07/25/17 0500  07/25/17 2240 07/26/17 0315 07/26/17 1845 07/27/17 0500 07/28/17 0330  07/29/17 0500 07/30/17 0532  WBC  --   < > 17.7*  --   --  10.7* 9.6  --  8.7  --  5.5  --   CREATININE  --   < >  --   < > 4.84* 4.75*  --  4.23* 3.68*  --  3.53*  --   LATICACIDVEN 1.73  --   --   --   --   --   --   --   --   --   --   --   VANCORANDOM  --   --   --   --   --   --   --  15  --   < > 23 20  < > = values in this interval not displayed.  Estimated Creatinine Clearance: 16.1 mL/min (A) (by C-G formula based on SCr of 3.53 mg/dL (H)).    Allergies  Allergen Reactions  . Remeron [Mirtazapine]     Per MAR. No rxn listed   Antimicrobials this admission: 8/30 Vancomycin >> 8/29 Zosyn >> 9/1 9/1 merrem>>  Dose adjustments this admission: 9/31 at 0500 VT=  15 (1 gm x1 given on 8/30 at 0617) -- redose 1gm x1 at 0720 9/2 VR at 1228= 28 (29 hrs after 1gm dose) 9/3 VT at 0500 = 23 (45/5 hrs after 1 gm dose) 9/4 VR at 0500 = 20 (~ 69 hours after 1 Gm dose)   Microbiology results: 8/29 BCx x2: 1/4 bottles with GPC in clusters (BCID= CoNS, MR)->?  contaminant 8/29  UCx (not collected prior to 1st dose abx): 20K ESBL ecoli FINAL 8/29 MRSA PCR (+) 8/31 cdiff PCR (-) 9/2 BCx2>>    Thank you for allowing pharmacy to be a part of this patient's care.  Lorenza EvangelistGreen, Sagal Gayton R 07/30/2017 6:42 AM

## 2017-07-30 NOTE — Progress Notes (Signed)
PT Cancellation Note  Patient Details Name: Katie Evans MRN: [id number] DOB: June 22, 1946   Cancelled Treatment:    Reason Eval/Treat Not Completed: PT screened, no needs identified, will sign off. Patient is long term care resident of SNF. Patient has not been medically stable for evaluation. Currently. Requires total care. No skilled PT needs identified. Collaborated with MSW in regardsthat no skilled PT needs are identified while in hospital.   Rada HayHill, Graclynn Vanantwerp Elizabeth 07/30/2017, 8:27 AM Blanchard KelchKaren Laderius Valbuena PT [phone]

## 2017-07-30 NOTE — Progress Notes (Signed)
CRITICAL VALUE ALERT  Critical Value:  Potassium 2.7  Date & Time Notied:  07/30/17   Provider Notified: Canary BrimBrandi Ollis, NP  Orders Received/Actions taken: Oral potassium 40 mEq ordered

## 2017-07-30 NOTE — Progress Notes (Signed)
[facility] PCCM   2 issues:   developing thrombocytopenia, likely sepsis related, has had this in past as well as clumps on some specimens, timecourse doesn't fit hitt, stop lovenox place scd  slp feels she doesn't protect airway well, place feeding tube  Heber CarolinaBrent McQuaid, MD [place] P[facility] Pager: [phone] Cell: [phone] After 3pm or if no response, call [phone]

## 2017-07-30 NOTE — Progress Notes (Signed)
Peripherally Inserted Central Catheter/Midline Placement  The IV Nurse has discussed with the patient and/or persons authorized to consent for the patient, the purpose of this procedure and the potential benefits and risks involved with this procedure.  The benefits include less needle sticks, lab draws from the catheter, and the patient may be discharged home with the catheter. Risks include, but not limited to, infection, bleeding, blood clot (thrombus formation), and puncture of an artery; nerve damage and irregular heartbeat and possibility to perform a PICC exchange if needed/ordered by physician.  Alternatives to this procedure were also discussed.  Bard Power PICC patient education guide, fact sheet on infection prevention and patient information card has been provided to patient /or left at bedside.    PICC/Midline Placement Documentation    Unable to insert PICC via right arm. Would not pass subclavian are. RN  made aware.    Katie Evans, Katie Evans 07/30/2017, 5:08 PM

## 2017-07-30 NOTE — Progress Notes (Signed)
Called by RN on behalf of IV Team > MD aware of serum creatinine and trend is improving.  She is not a chronic renal patient.  OK to place PICC as ordered.   Canary BrimBrandi Ollis, NP-C [place] [facility] Pgr: [phone] or if no answer [phone] 07/30/2017, 3:09 PM

## 2017-07-30 NOTE — Progress Notes (Signed)
Nutrition Follow-up  DOCUMENTATION CODES:   Severe malnutrition in context of acute illness/injury, Obesity unspecified  INTERVENTION:  - Recommend TF regimen: Glucerna 1.2 @ 25 mL/hr to advance by 10 mL every 8 hours to reach goal rate of Glucerna 1.2 @ 65 mL/hr. At goal rate, this regimen will provide 1872 kcal (96% minimum estimated kcal need), 94 grams of protein, and 1256 mL free water.   Monitor magnesium, potassium, and phosphorus daily for at least 3 days, MD to replete as needed, as pt is at risk for refeeding syndrome given current hypokalemia, mainly NPO since admission (6 days), and unsure of PO intakes PTA.   NUTRITION DIAGNOSIS:   Inadequate oral intake related to inability to eat as evidenced by NPO status. -ongoing  GOAL:   Patient will meet greater than or equal to 90% of their needs -unmet  MONITOR:   TF tolerance, Weight trends, Labs, Skin  ASSESSMENT:   71 year old female with PMH as below, which is significant for parkinson's, CHF, COPD, GERD, HTN, Hyperthyroid on methimazole, and Xarelto for PE. She has a long history of prescription drug abuse, overdoses, and non-compliance with medical therapies, which have ultimately lead to what is essentially failure to thrive. She resides in SNF where she was noted to have altered mental status for about 1 month by her sister. In June she was able to get around the SNF and use her cell phone and tablet. Daughter feels that something abrupt happened and that all changed. This was progressive until 8/29 when the patient's daughter demanded that she be sent to the ER where she was felt to be extremely dehydrated. She was provided with aggressive IVF resuscitation, and admitted to the hospitalist team for renal failure, encephalopathy, and possible UTI. CT scan of head concerning for CVA as well. She became hypotensive, which persisted despite IVF resuscitation and was transferred to ICU for pressor support.   9/4 Diet advanced  from NPO to FLD, nectar-thick liquids, yesterday at 1515 and then again made NPO today shortly after noon. SLP recommends NPO and Dr. Ulyses JarredMcQuaid's note from today at 1433 states plan for feeding tube placement. TF recommendations outlined above. No new weight since admission.  Medications reviewed; 20 mg oral Pepcid/day, sliding scale  Novolog, 2 g IV Mg sulfate x1 run today, 40 mEq oral KCl x2 doses today. Labs reviewed; CBGs: 82 x2 and 98 mg/dL today, Na: 161146 mmol/L, K: 2.7 mmol/L, BUN: 49 mg/dL, creatinine: 0.963.33 mg/dL, Ca: 8 mg/dL, GFR: 13 mL/min.   IVF: D5-1/2 NS @ 50 mL/hr (204 kcal from dextrose).   8/30 - Pt has been NPO since admission and RN reports inability to have SLP evaluation d/t presentation of symptoms of stroke.  - She also reports that pt seems to have sores in her mouth.  - No family/visitors present at this time and pt unable to provide information.  - PCCM NP note from today states, "doubt family would agree to tube feeds if not sure to be temporary."  - Physical assessment limited d/t Bair Hugger in place; mild/moderate muscle wasting to temple and no other muscle and no fat wasting noted to areas assessed.  - Per chart review, pt has lost 27 lbs (12% body weight) in the past 4 months which is significant for time frame. - Noted that weight has been recorded as exactly the same (187 lbs/84.8 kg) on 5/18, 5/22, 5/29, 5/31, and 8/29.  - Unable to state malnutrition with limited information available at this  time.   Na: 152 mmol/L (trending down), Cl: >130 mmol/L IVF: D5-150 mEq sodium bicarb @ 150 mL/hr (612 kcal from dextrose).   Diet Order:  Diet NPO time specified  Skin:  Wound (see comment) (Stage 2 sacral and thigh pressure injuries)  Last BM:  9/4  Height:   Ht Readings from Last 1 Encounters:  07/24/17 5\' 4"  (1.626 m)    Weight:   Wt Readings from Last 1 Encounters:  07/24/17 187 lb (84.8 kg)    Ideal Body Weight:  54.54 kg  BMI:  Body mass index is  32.1 kg/m.  Estimated Nutritional Needs:   Kcal:  1950-2120 (23-25 kcal/kg)  Protein:  95-105 grams  Fluid:  >/= 2.3 L/day  EDUCATION NEEDS:   No education needs identified at this time    Trenton Gammon, MS, RD, LDN, CNSC Inpatient Clinical Dietitian Pager # [phone] After hours/weekend pager # [phone]

## 2017-07-31 DIAGNOSIS — E43 Unspecified severe protein-calorie malnutrition: Secondary | ICD-10-CM | POA: Insufficient documentation

## 2017-07-31 LAB — BASIC METABOLIC PANEL
Anion gap: 10 (ref 5–15)
BUN: 48 mg/dL — ABNORMAL HIGH (ref 6–20)
CALCIUM: 8.2 mg/dL — AB (ref 8.9–10.3)
CO2: 24 mmol/L (ref 22–32)
CREATININE: 3.25 mg/dL — AB (ref 0.44–1.00)
Chloride: 111 mmol/L (ref 101–111)
GFR, EST AFRICAN AMERICAN: 16 mL/min — AB (ref >60)
GFR, EST NON AFRICAN AMERICAN: 14 mL/min — AB (ref >60)
GLUCOSE: 93 mg/dL (ref 65–99)
Potassium: 2.7 mmol/L — CL (ref 3.5–5.1)
Sodium: 145 mmol/L (ref 135–145)

## 2017-07-31 LAB — GLUCOSE, CAPILLARY
Glucose-Capillary: 93 mg/dL (ref 65–99)
Glucose-Capillary: 103 mg/dL — ABNORMAL HIGH (ref 65–99)
Glucose-Capillary: 97 mg/dL (ref 65–99)
Glucose-Capillary: 86 mg/dL (ref 65–99)

## 2017-07-31 MED ORDER — FLUCONAZOLE IN SODIUM CHLORIDE 100-0.9 MG/50ML-% IV SOLN
100.0000 mg | INTRAVENOUS | Status: DC
  Administered 2017-07-31 – 2017-08-01 (×2): 100 mg via INTRAVENOUS
  Filled 2017-07-31 (×3): qty 50

## 2017-07-31 MED ORDER — POTASSIUM CHLORIDE 10 MEQ/100ML IV SOLN
10.0000 meq | INTRAVENOUS | Status: AC
  Administered 2017-07-31 (×2): 10 meq via INTRAVENOUS
  Filled 2017-07-31 (×2): qty 100

## 2017-07-31 MED ORDER — MORPHINE SULFATE (PF) 2 MG/ML IV SOLN: 1.0000 mg | INTRAVENOUS | Status: DC | PRN

## 2017-07-31 NOTE — Care Management Important Message (Signed)
Important Message  Patient Details IM Letter given to Rhonda/Case Manager to present to Patient Name: Katie Evans MRN: [id number] Date of Birth: [birth year]   Medicare Important Message Given:  Yes    Caren MacadamFuller, Shayanna Thatch 07/31/2017, 10:02 AM

## 2017-07-31 NOTE — Progress Notes (Addendum)
  Speech Language Pathology Treatment: Dysphagia  Patient Details Name: Katie Roseamela Evans MRN: [id number] DOB: July 22, 1946 Today's Date: 07/31/2017 Time: [id number] SLP Time Calculation (min) (ACUTE ONLY): 29 min  Assessment / Plan / Recommendation Clinical Impression  Pt marginally improved today - accepted and tolerated approximately 2 ounces of nectar thickened cran-grape juice via tsp, cup without indication of airway compromise. She continues with delayed swallow across all consistencies.  Gag response observed with larger bolus of applesauce - which may increase vomiting and emesis aspiration risk.    Pt does appears with minimal white areas posterior superior oral cavity - ? If could be early oral candidiasis.   At this time, would recommend to initiate tsps of nectar but doubt pt will meet nutritional needs as she reports she does not desire po intake.  Would not recommend any consistency beyond nectar due to pt's sensitive gag and medications via alternative route advised.    Pt has cognitive and sensorimotor based dysphagia and per chart review, intake was poor prior to admission.    Note palliative referral planned for today -.  Spoke to hospitalist regarding concern for pt's extensive gag response and suspected poor tolerance of cortrak.    Recommend await palliative referral findings for nutritional plans.  Suspect "comfort intake" of nectar may be realistic consideration for this pt.    HPI HPI: Katie Evans a 71 y.o.femalewith medical history significant of past medical history of chronic renal disease stage III, chronic heart failure, ? 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 Found to have septic shock secondary to  UTI, acute renal failure, hyponatremia, metabolic acidosis and GPC bacteremia. CXR stable appearance of left lower lobar pneumonia, small left pleural effusion. Pt has  been seen for swallowing evaluation and follow up indicated to determine readiness for po.  Pt has been NPO since admission and per RN, pt was not eating well prior to admission.       SLP Plan  Continue with current plan of care       Recommendations  Diet recommendations: Nectar-thick liquid Liquids provided via: Teaspoon Medication Administration: Via alternative means Supervision: Full supervision/cueing for compensatory strategies Compensations: Slow rate;Small sips/bites (assure pt swallows before giving more ) Postural Changes and/or Swallow Maneuvers: Seated upright 90 degrees;Upright 30-60 min after meal                Oral Care Recommendations: Oral care QID SLP Visit Diagnosis: Dysphagia, oropharyngeal phase (R13.12) Plan: Continue with current plan of care       GO               Katie Burnetamara Evelia Waskey, MS [facility] SLP [phone]  Katie AbrahamsKimball, Katie Evans 07/31/2017, 9:54 AM

## 2017-07-31 NOTE — Progress Notes (Signed)
CRITICAL VALUE ALERT  Critical Value:  Potassium 2.7  Date & Time Noted:  07/31/17 0723am  Provider Notified: Dr. Sharl MaLama   Orders Received/Actions taken: awaiting callback

## 2017-07-31 NOTE — Progress Notes (Signed)
[facility]  PROGRESS NOTE  Katie Evans ZOX:[id number] DOB: 04/01/1946 DOA: 07/24/2017 PCP: No primary care provider on file.   Brief HPI:    71 year old female with history of chronic diastolic CHF, CK D stage III, pulmonary embolism on anticoagulation with Xarelto, depression worsened from skilled facility for altered mental status. Patient was hypotensive, found to be in septic shock from Escherichia coli UTI. Patient was admitted to ICU under [facility] service. Patient was started on pressor support with Levophed.    Subjective   Patient seen and examined, denies chest pain or shortness of breath.   Assessment/Plan:     1. Septic shock-secondary to UTI, resolved. Patient is off Levophed. Culture growing ESBL E coli, sensitive to imipenem. Patient is currently on meropenem. Blood cultures growing: Coagulase negative staph in 1 out of 2 bottles. 2. Acute kidney injury-patient space and creatinine is 1.3 as of 04/17/2017, she presented with creatinine of 7.52. Slow improvement in creatinine today it is 3.25. Follow BMP in a.m. 3. Dysphagia- patient has moderate oropharyngeal dysphagia, was seen by speech therapy, patient continues to have delayed swallow across consistencies. Currently she is on nectar thick liquid diet. Palliative care consulted for goals of care. 4. History of pulmonary embolism-patient was on Xarelto at home, which was held at the time of admission. Patient's platelet count is 46,000. Hold Lovenox. Will restart Lovenox once count is greater than 100,000. 5. Oropharyngeal candidiasis- as per speech therapy note, will start Diflucan 100 mg IV daily. 6. Acute metabolic encephalopathy - resolved, patient back to baseline. MRI shows no CVA.  EEG is consistent with a generalized nonspecific cerebral dysfunction(encephalopathy). There was no seizure or seizure predisposition recorded on this study.  7.  Thrombocytopenia - likely from sepsis. Will check CBC in a.m.  8.   Hyperthyroidism - TSH within normal limits, continue methimazole.  9.  Diabetes mellitus-continue sliding scale insulin with NovoLog. 10. Anemia of chronic disease- hemoglobin is 8.4. Follow CBC in a.m.    DVT prophylaxis: Start on Lovenox  Code Status: DO NOT RESUSCITATE  Family Communication: No family at bedside   Disposition Plan: To be determined   Consultants:  [facility] M  Procedures: STUDIES:  CT head 8/29 > Possible moderate acute infarct in the lateral left frontal lobe. Certainty is diminished by artifact from patient positioning. Chronic microvascular ischemia. Chronic left maxillary sinusitis. CT abd 8/29 > No acute intra-abdominal process. Large stool ball in the rectum. Correlate for fecal impaction. Small amount of patchy centrilobular nodularity in the left lower lobe, which could reflect aspiration. 8/31 renal us>>  CULTURES: Blood 8/29 >staph mrsa>> Urine 8/30 >  ANTIBIOTICS: Zosyn 8/29 > Vanco 8/30 >  SIGNIFICANT EVENTS: 8/29 admit 8/30 tx to ICU for pressors.   LINES/TUBES: RIJ CVL 8/30 >  Continuous infusions . sodium chloride    . dextrose 5 % and 0.45% NaCl 50 mL/hr at 07/31/17 0612  . meropenem (MERREM) IV Stopped (07/31/17 1519)      Antibiotics:   Anti-infectives    Start     Dose/Rate Route Frequency Ordered Stop   07/30/17 0800  vancomycin (VANCOCIN) IVPB 1000 mg/200 mL premix     1,000 mg 200 mL/hr over 60 Minutes Intravenous  Once 07/30/17 0645 07/30/17 0904   07/27/17 1430  meropenem (MERREM) 1 g in sodium chloride 0.9 % 100 mL IVPB     1 g 200 mL/hr over 30 Minutes Intravenous Every 12 hours 07/27/17 1410     07/27/17 0700  vancomycin (  VANCOCIN) IVPB 1000 mg/200 mL premix     1,000 mg 200 mL/hr over 60 Minutes Intravenous  Once 07/27/17 0647 07/27/17 0830   07/26/17 1100  piperacillin-tazobactam (ZOSYN) IVPB 2.25 g  Status:  Discontinued     2.25 g 100 mL/hr over 30 Minutes Intravenous Every 6 hours 07/26/17 0931 07/27/17  1409   07/25/17 0545  vancomycin (VANCOCIN) IVPB 1000 mg/200 mL premix     1,000 mg 200 mL/hr over 60 Minutes Intravenous  Once 07/25/17 0540 07/25/17 0740   07/24/17 2157  piperacillin-tazobactam (ZOSYN) IVPB 2.25 g  Status:  Discontinued     2.25 g 100 mL/hr over 30 Minutes Intravenous Every 8 hours 07/24/17 1504 07/26/17 0931   07/24/17 1330  piperacillin-tazobactam (ZOSYN) IVPB 2.25 g  Status:  Discontinued     2.25 g 100 mL/hr over 30 Minutes Intravenous Every 6 hours 07/24/17 1317 07/24/17 1504       Objective   Vitals:   07/30/17 1427 07/30/17 2220 07/31/17 0452 07/31/17 1450  BP: 102/88 99/62 (!) 98/55 (!) 90/49  Pulse: (!) 55 (!) 58 (!) 56 (!) 58  Resp: 18 18 18 16   Temp: (!) 95.9 F (35.5 C) (!) 96.1 F (35.6 C) (!) 96.6 F (35.9 C) (!) 97.2 F (36.2 C)  TempSrc: Axillary Axillary Axillary Axillary  SpO2: 96% 100% 100% 97%  Weight:      Height:        Intake/Output Summary (Last 24 hours) at 07/31/17 1541 Last data filed at 07/31/17 1500  Gross per 24 hour  Intake             1460 ml  Output              300 ml  Net             1160 ml   Filed Weights   07/24/17 1043  Weight: 84.8 kg (187 lb)     Physical Examination:   Physical Exam: Eyes: No icterus, extraocular muscles intact  Mouth: Oral mucosa is moist, no lesions on palate,  Neck: Supple, no deformities, masses, or tenderness Lungs: Normal respiratory effort, bilateral clear to auscultation, no crackles or wheezes.  Heart: Regular rate and rhythm, S1 and S2 normal, no murmurs, rubs auscultated Abdomen: BS normoactive,soft,nondistended,non-tender to palpation,no organomegaly Extremities: No pretibial edema, no erythema, no cyanosis, no clubbing Neuro : Alert and oriented to time, place and person, No focal deficits       Data Reviewed: I have personally reviewed following labs and imaging studies  CBG:  Recent Labs Lab 07/30/17 1132 07/30/17 1709 07/30/17 2218 07/31/17 0749  07/31/17 1157  GLUCAP 98 97 93 93 103*    CBC:  Recent Labs Lab 07/26/17 0315 07/26/17 1845 07/28/17 0330 07/29/17 0500 07/30/17 0531  WBC 10.7* 9.6 8.7 5.5 4.1  HGB 7.7* 7.6* 7.8* 7.3* 8.4*  HCT 24.0* 23.3* 24.0* 22.5* 25.1*  MCV 89.9 89.3 87.9 87.9 87.8  PLT PLATELET CLUMPS NOTED ON SMEAR, UNABLE TO ESTIMATE 91* 85* 67* 46*    Basic Metabolic Panel:  Recent Labs Lab 07/24/17 1648  07/26/17 0315 07/27/17 0500 07/28/17 0330 07/29/17 0500 07/30/17 0531 07/31/17 0612  NA 161*  < > 147* 147* 145 146* 146* 145  K 6.0*  < > 3.3* 2.4* 2.8* 3.0* 2.7* 2.7*  CL >130*  < > 115* 108 105 108 110 111  CO2 14*  < > 21* 26 30 29 27 24   GLUCOSE 65  < > 213* 116*  101* 92 90 93  BUN 98*  < > 74* 60* 55* 53* 49* 48*  CREATININE 6.93*  6.90*  < > 4.75* 4.23* 3.68* 3.53* 3.33* 3.25*  CALCIUM 11.4*  < > 8.7* 8.3* 8.1* 8.1* 8.0* 8.2*  MG 2.1  --  1.2*  --  1.0* 2.0 1.8  --   PHOS 4.2  --  2.3*  --  2.3* 3.4 3.7  --   < > = values in this interval not displayed.  Recent Results (from the past 240 hour(s))  Blood culture (routine x 2)     Status: None   Collection Time: 07/24/17 10:56 AM  Result Value Ref Range Status   Specimen Description BLOOD RIGHT ANTECUBITAL  Final   Special Requests   Final    BOTTLES DRAWN AEROBIC AND ANAEROBIC Blood Culture adequate volume   Culture   Final    NO GROWTH 5 DAYS Performed at [facility], [address] [place], [place] [address]    Report Status 07/29/2017 FINAL  Final  Blood culture (routine x 2)     Status: Abnormal   Collection Time: 07/24/17 12:26 PM  Result Value Ref Range Status   Specimen Description BLOOD LEFT ANTECUBITAL  Final   Special Requests   Final    BOTTLES DRAWN AEROBIC AND ANAEROBIC Blood Culture adequate volume   Culture  Setup Time   Final    GRAM POSITIVE COCCI IN CLUSTERS ANAEROBIC BOTTLE ONLY CRITICAL RESULT CALLED TO, READ BACK BY AND VERIFIED WITH: M.LILLISTON PHARMD 0840 [id number] HDAY    Culture (A)   Final    STAPHYLOCOCCUS SPECIES (COAGULASE NEGATIVE) THE SIGNIFICANCE OF ISOLATING THIS ORGANISM FROM A SINGLE SET OF BLOOD CULTURES WHEN MULTIPLE SETS ARE DRAWN IS UNCERTAIN. PLEASE NOTIFY THE MICROBIOLOGY DEPARTMENT WITHIN ONE WEEK IF SPECIATION AND SENSITIVITIES ARE REQUIRED. Performed at [facility], [address], [place], [place] [address]    Report Status 07/28/2017 FINAL  Final  Blood Culture ID Panel (Reflexed)     Status: Abnormal   Collection Time: 07/24/17 12:26 PM  Result Value Ref Range Status   Enterococcus species NOT DETECTED NOT DETECTED Final   Listeria monocytogenes NOT DETECTED NOT DETECTED Final   Staphylococcus species DETECTED (A) NOT DETECTED Final    Comment: Methicillin (oxacillin) resistant coagulase negative staphylococcus. Possible blood culture contaminant (unless isolated from more than one blood culture draw or clinical case suggests pathogenicity). No antibiotic treatment is indicated for blood  culture contaminants. CRITICAL RESULT CALLED TO, READ BACK BY AND VERIFIED WITH: M.LILLISTON PHARMD 0840 [id number] HDAY    Staphylococcus aureus NOT DETECTED NOT DETECTED Final   Methicillin resistance DETECTED (A) NOT DETECTED Final    Comment: CRITICAL RESULT CALLED TO, READ BACK BY AND VERIFIED WITH: M.LILLISTON PHARMD 0840 [id number] HDAY    Streptococcus species NOT DETECTED NOT DETECTED Final   Streptococcus agalactiae NOT DETECTED NOT DETECTED Final   Streptococcus pneumoniae NOT DETECTED NOT DETECTED Final   Streptococcus pyogenes NOT DETECTED NOT DETECTED Final   Acinetobacter baumannii NOT DETECTED NOT DETECTED Final   Enterobacteriaceae species NOT DETECTED NOT DETECTED Final   Enterobacter cloacae complex NOT DETECTED NOT DETECTED Final   Escherichia coli NOT DETECTED NOT DETECTED Final   Klebsiella oxytoca NOT DETECTED NOT DETECTED Final   Klebsiella pneumoniae NOT DETECTED NOT DETECTED Final   Proteus species NOT DETECTED NOT DETECTED Final    Serratia marcescens NOT DETECTED NOT DETECTED Final   Haemophilus influenzae NOT DETECTED NOT  DETECTED Final   Neisseria meningitidis NOT DETECTED NOT DETECTED Final   Pseudomonas aeruginosa NOT DETECTED NOT DETECTED Final   Candida albicans NOT DETECTED NOT DETECTED Final   Candida glabrata NOT DETECTED NOT DETECTED Final   Candida krusei NOT DETECTED NOT DETECTED Final   Candida parapsilosis NOT DETECTED NOT DETECTED Final   Candida tropicalis NOT DETECTED NOT DETECTED Final    Comment: Performed at [facility], [address], [place], [place] [address]  Culture, Urine     Status: Abnormal   Collection Time: 07/24/17 12:28 PM  Result Value Ref Range Status   Specimen Description Urine  Final   Special Requests NONE  Final   Culture (A)  Final    20,000 COLONIES/mL ESCHERICHIA COLI Confirmed Extended Spectrum Beta-Lactamase Producer (ESBL) Performed at [facility], [address], [place], [place] [address]    Report Status 07/27/2017 FINAL  Final   Organism ID, Bacteria ESCHERICHIA COLI (A)  Final      Susceptibility   Escherichia coli - MIC*    AMPICILLIN >=32 RESISTANT Resistant     CEFAZOLIN >=64 RESISTANT Resistant     CEFEPIME >=64 RESISTANT Resistant     CEFTAZIDIME >=64 RESISTANT Resistant     CEFTRIAXONE >=64 RESISTANT Resistant     CIPROFLOXACIN >=4 RESISTANT Resistant     GENTAMICIN >=16 RESISTANT Resistant     IMIPENEM <=0.25 SENSITIVE Sensitive     TRIMETH/SULFA <=20 SENSITIVE Sensitive     AMPICILLIN/SULBACTAM >=32 RESISTANT Resistant     PIP/TAZO 64 INTERMEDIATE Intermediate     Extended ESBL POSITIVE Resistant     * 20,000 COLONIES/mL ESCHERICHIA COLI  MRSA PCR Screening     Status: Abnormal   Collection Time: 07/24/17  4:25 PM  Result Value Ref Range Status   MRSA by PCR POSITIVE (A) NEGATIVE Final    Comment:        The GeneXpert MRSA Assay (FDA approved for NASAL specimens only), is one component of a comprehensive MRSA  colonization surveillance program. It is not intended to diagnose MRSA infection nor to guide or monitor treatment for MRSA infections. RESULT CALLED TO, READ BACK BY AND VERIFIED WITH: S.ODOM RN AT 2015 ON 07/24/17 BY S.VANHOORNE MLS   C difficile quick scan w PCR reflex     Status: None   Collection Time: 07/26/17  6:25 PM  Result Value Ref Range Status   C Diff antigen NEGATIVE NEGATIVE Final   C Diff toxin NEGATIVE NEGATIVE Final   C Diff interpretation No C. difficile detected.  Final  Culture, blood (Routine X 2) w Reflex to ID Panel     Status: None (Preliminary result)   Collection Time: 07/28/17  6:52 PM  Result Value Ref Range Status   Specimen Description BLOOD RIGHT ARM  Final   Special Requests IN PEDIATRIC BOTTLE Blood Culture adequate volume  Final   Culture   Final    NO GROWTH 2 DAYS Performed at [facility], [address] [place], [place] [address]    Report Status PENDING  Incomplete  Culture, blood (Routine X 2) w Reflex to ID Panel     Status: None (Preliminary result)   Collection Time: 07/28/17  6:52 PM  Result Value Ref Range Status   Specimen Description BLOOD RIGHT HAND  Final   Special Requests AEROBIC BOTTLE ONLY Blood Culture adequate volume  Final   Culture   Final    NO GROWTH 2 DAYS Performed at  [facility], [address] [place], [place] [address]    Report Status PENDING  Incomplete     Liver Function Tests: No results for input(s): AST, ALT, ALKPHOS, BILITOT, PROT, ALBUMIN in the last 168 hours. No results for input(s): LIPASE, AMYLASE in the last 168 hours. No results for input(s): AMMONIA in the last 168 hours.  Cardiac Enzymes: No results for input(s): CKTOTAL, CKMB, CKMBINDEX, TROPONINI in the last 168 hours. BNP (last 3 results)  Recent Labs  07/24/17 1057  BNP 160.4*    ProBNP (last 3 results) No results for input(s): PROBNP in the last 8760 hours.    Studies: No results found.  Scheduled Meds: .  chlorhexidine  15 mL Mouth Rinse BID  . Chlorhexidine Gluconate Cloth  6 each Topical Daily  . famotidine  20 mg Oral Daily  . insulin aspart  0-15 Units Subcutaneous TID WC  . insulin aspart  0-5 Units Subcutaneous QHS  . lidocaine  15 mL Mouth/Throat TID  . mouth rinse  15 mL Mouth Rinse BID  . methimazole  5 mg Oral Daily  . sodium chloride flush  10-40 mL Intracatheter Q12H      Time spent: 25 min  [facility],Moneka Mcquinn S   [facility] Pager [phone]. If 7PM-7AM, please contact night-coverage at [email or web address], Office  [phone]  password [email or web address]  07/31/2017, 3:41 PM  LOS: 7 days

## 2017-07-31 NOTE — Progress Notes (Signed)
ANTICOAGULATION CONSULT NOTE  Pharmacy Consult for Lovenox Indication: h/o PE  Allergies  Allergen Reactions  . Remeron [Mirtazapine]     Per MAR. No rxn listed    Patient Measurements: Height: 5\' 4"  (162.6 cm) Weight: 187 lb (84.8 kg) IBW/kg (Calculated) : 54.7  Vital Signs: Temp: 97.2 F (36.2 C) (09/05 1450) Temp Source: Axillary (09/05 1450) BP: 90/49 (09/05 1450) Pulse Rate: 58 (09/05 1450)  Labs:  Recent Labs  07/29/17 0500 07/30/17 0531 07/31/17 0612  HGB 7.3* 8.4*  --   HCT 22.5* 25.1*  --   PLT 67* 46*  --   CREATININE 3.53* 3.33* 3.25*    Estimated Creatinine Clearance: 17.4 mL/min (A) (by C-G formula based on SCr of 3.25 mg/dL (H)).  Assessment: [age] yoF with CHF, COPD admitted from a SNF for confusion, septic shock.  Patient on Xarelto PTA for h/o PE which has been on hold due to AKI.  Prophylactic Lovenox resumed on admission and subsequently held 9/4 due to decreasing platelets <50K.  Pharmacy consulted tonight to resume Lovenox.  Discussed with TR[facility] and per their instruction, will resume Lovenox once platelets >100K.  Goal of Therapy:  Anti-Xa level 0.6-1 units/ml 4hrs after LMWH dose given Monitor platelets by anticoagulation protocol: Yes   Plan:  F/u CBC for resumption of Lovenox.  Clance BollRunyon, Caeleb Batalla 07/31/2017,4:12 PM

## 2017-08-01 DIAGNOSIS — Z7189 Other specified counseling: Secondary | ICD-10-CM

## 2017-08-01 DIAGNOSIS — E43 Unspecified severe protein-calorie malnutrition: Secondary | ICD-10-CM

## 2017-08-01 DIAGNOSIS — Z515 Encounter for palliative care: Secondary | ICD-10-CM

## 2017-08-01 LAB — BASIC METABOLIC PANEL
Anion gap: 8 (ref 5–15)
BUN: 47 mg/dL — ABNORMAL HIGH (ref 6–20)
CALCIUM: 8.3 mg/dL — AB (ref 8.9–10.3)
CO2: 26 mmol/L (ref 22–32)
CREATININE: 3.04 mg/dL — AB (ref 0.44–1.00)
Chloride: 115 mmol/L — ABNORMAL HIGH (ref 101–111)
GFR calc Af Amer: 17 mL/min — ABNORMAL LOW (ref >60)
GFR calc non Af Amer: 15 mL/min — ABNORMAL LOW (ref >60)
GLUCOSE: 79 mg/dL (ref 65–99)
Potassium: 2.8 mmol/L — ABNORMAL LOW (ref 3.5–5.1)
Sodium: 149 mmol/L — ABNORMAL HIGH (ref 135–145)

## 2017-08-01 LAB — GLUCOSE, CAPILLARY
Glucose-Capillary: 93 mg/dL (ref 65–99)
Glucose-Capillary: 83 mg/dL (ref 65–99)
Glucose-Capillary: 72 mg/dL (ref 65–99)
Glucose-Capillary: 75 mg/dL (ref 65–99)

## 2017-08-01 LAB — CBC
HEMATOCRIT: 25.4 % — AB (ref 36.0–46.0)
Hemoglobin: 8.3 g/dL — ABNORMAL LOW (ref 12.0–15.0)
MCH: 29 pg (ref 26.0–34.0)
MCHC: 32.7 g/dL (ref 30.0–36.0)
MCV: 88.8 fL (ref 78.0–100.0)
Platelets: 45 10*3/uL — ABNORMAL LOW (ref 150–400)
RBC: 2.86 MIL/uL — ABNORMAL LOW (ref 3.87–5.11)
RDW: 18.8 % — AB (ref 11.5–15.5)
WBC: 4.6 10*3/uL (ref 4.0–10.5)

## 2017-08-01 LAB — MAGNESIUM: Magnesium: 1.8 mg/dL (ref 1.7–2.4)

## 2017-08-01 MED ORDER — MAGNESIUM SULFATE 2 GM/50ML IV SOLN
2.0000 g | Freq: Once | INTRAVENOUS | Status: AC
  Administered 2017-08-01: 2 g via INTRAVENOUS
  Filled 2017-08-01: qty 50

## 2017-08-01 MED ORDER — POTASSIUM CHLORIDE 10 MEQ/100ML IV SOLN
10.0000 meq | INTRAVENOUS | Status: AC
  Administered 2017-08-01 (×4): 10 meq via INTRAVENOUS
  Filled 2017-08-01 (×4): qty 100

## 2017-08-01 NOTE — Progress Notes (Signed)
[facility]  PROGRESS NOTE  Katie Evans XIP:[id number] DOB: [birth year] DOA: 07/24/2017 PCP: No primary care provider on file.   Brief HPI:    71 year old female with history of chronic diastolic CHF, CK D stage III, pulmonary embolism on anticoagulation with Xarelto, depression worsened from skilled facility for altered mental status. Patient was hypotensive, found to be in septic shock from Escherichia coli UTI. Patient was admitted to ICU under [facility] service. Patient was started on pressor support with Levophed.    Subjective    patient seen and examined, denies pin   Assessment/Plan:     1. Septic shock-secondary to UTI, resolved. Patient is off Levophed. Culture growing ESBL E coli, sensitive to imipenem. Patient is currently on meropenem. Blood cultures growing: Coagulase negative staph in 1 out of 2 bottles. 2. Acute kidney injury-patient space and creatinine is 1.3 as of 04/17/2017, she presented with creatinine of 7.52. Slow improvement in creatinine today it is 3.04 3. Dysphagia- patient has moderate oropharyngeal dysphagia, was seen by speech therapy, patient continues to have delayed swallow across consistencies. Currently she is on nectar thick liquid diet. Palliative care consulted for goals of care. 4. History of pulmonary embolism-patient was on Xarelto at home, which was held at the time of admission. Patient's platelet count is 46,000. Hold Lovenox. Will restart Lovenox once count is greater than 100,000. 5. Oropharyngeal candidiasis- as per speech therapy note, will start Diflucan 100 mg IV daily. 6. Acute metabolic encephalopathy - resolved, patient back to baseline. MRI shows no CVA.  EEG is consistent with a generalized nonspecific cerebral dysfunction(encephalopathy). There was no seizure or seizure predisposition recorded on this study.  7.  Thrombocytopenia - likely from sepsis.  8.  Hyperthyroidism - TSH within normal limits, continue methimazole.  9.   Diabetes mellitus-continue sliding scale insulin with NovoLog. 10. Anemia of chronic disease- hemoglobin is 8.4.  11.  Goals of care discussion-  Palliative care met with patient and her family,  after discussion family opted for comfort measures only. Social work consulted for residential hospice. Will not repeat labs in am.    DVT prophylaxis: Start on Lovenox  Code Status: DO NOT RESUSCITATE  Family Communication: No family at bedside   Disposition Plan: To be determined   Consultants:  [facility]  Procedures: STUDIES:  CT head 8/29 > Possible moderate acute infarct in the lateral left frontal lobe. Certainty is diminished by artifact from patient positioning. Chronic microvascular ischemia. Chronic left maxillary sinusitis. CT abd 8/29 > No acute intra-abdominal process. Large stool ball in the rectum. Correlate for fecal impaction. Small amount of patchy centrilobular nodularity in the left lower lobe, which could reflect aspiration. 8/31 renal us>>  CULTURES: Blood 8/29 >staph mrsa>> Urine 8/30 >  ANTIBIOTICS: Zosyn 8/29 > Vanco 8/30 >  SIGNIFICANT EVENTS: 8/29 admit 8/30 tx to ICU for pressors.   LINES/TUBES: RIJ CVL 8/30 >  Continuous infusions . sodium chloride    . dextrose 5 % and 0.45% NaCl 50 mL/hr at 08/01/17 1005  . fluconazole (DIFLUCAN) IV Stopped (07/31/17 1817)  . meropenem (MERREM) IV Stopped (08/01/17 0319)      Antibiotics:   Anti-infectives    Start     Dose/Rate Route Frequency Ordered Stop   07/31/17 1800  fluconazole (DIFLUCAN) IVPB 100 mg     100 mg 50 mL/hr over 60 Minutes Intravenous Every 24 hours 07/31/17 1612     07/30/17 0800  vancomycin (VANCOCIN) IVPB 1000 mg/200 mL premix  1,000 mg 200 mL/hr over 60 Minutes Intravenous  Once 07/30/17 0645 07/30/17 0904   07/27/17 1430  meropenem (MERREM) 1 g in sodium chloride 0.9 % 100 mL IVPB     1 g 200 mL/hr over 30 Minutes Intravenous Every 12 hours 07/27/17 1410     07/27/17  0700  vancomycin (VANCOCIN) IVPB 1000 mg/200 mL premix     1,000 mg 200 mL/hr over 60 Minutes Intravenous  Once 07/27/17 0647 07/27/17 0830   07/26/17 1100  piperacillin-tazobactam (ZOSYN) IVPB 2.25 g  Status:  Discontinued     2.25 g 100 mL/hr over 30 Minutes Intravenous Every 6 hours 07/26/17 0931 07/27/17 1409   07/25/17 0545  vancomycin (VANCOCIN) IVPB 1000 mg/200 mL premix     1,000 mg 200 mL/hr over 60 Minutes Intravenous  Once 07/25/17 0540 07/25/17 0740   07/24/17 2157  piperacillin-tazobactam (ZOSYN) IVPB 2.25 g  Status:  Discontinued     2.25 g 100 mL/hr over 30 Minutes Intravenous Every 8 hours 07/24/17 1504 07/26/17 0931   07/24/17 1330  piperacillin-tazobactam (ZOSYN) IVPB 2.25 g  Status:  Discontinued     2.25 g 100 mL/hr over 30 Minutes Intravenous Every 6 hours 07/24/17 1317 07/24/17 1504       Objective   Vitals:   07/31/17 1450 07/31/17 2100 08/01/17 0523 08/01/17 1325  BP: (!) 90/49 (!) 93/57 96/79 (!) 84/53  Pulse: (!) 58 (!) 59 63 63  Resp: '16 16 18 16  ' Temp: (!) 97.2 F (36.2 C) 97.7 F (36.5 C) 97.8 F (36.6 C) 97.6 F (36.4 C)  TempSrc: Axillary Axillary Axillary Axillary  SpO2: 97% 99% 97% 100%  Weight:      Height:        Intake/Output Summary (Last 24 hours) at 08/01/17 1510 Last data filed at 08/01/17 1200  Gross per 24 hour  Intake               50 ml  Output              300 ml  Net             -250 ml   Filed Weights   07/24/17 1043  Weight: 84.8 kg (187 lb)     Physical Examination:   Physical Exam: Eyes: No icterus, extraocular muscles intact  Mouth: Oral mucosa is moist, no lesions on palate,  Neck: Supple, no deformities, masses, or tenderness Lungs: Normal respiratory effort, bilateral clear to auscultation, no crackles or wheezes.  Heart: Regular rate and rhythm, S1 and S2 normal, no murmurs, rubs auscultated Abdomen: BS normoactive,soft,nondistended,non-tender to palpation,no organomegaly Extremities: No pretibial  edema, no erythema, no cyanosis, no clubbing Neuro : Alert and oriented to time, place and person, No focal deficits       Data Reviewed: I have personally reviewed following labs and imaging studies  CBG:  Recent Labs Lab 07/31/17 1157 07/31/17 1723 07/31/17 2150 08/01/17 0737 08/01/17 1158  GLUCAP 103* 97 86 93 83    CBC:  Recent Labs Lab 07/26/17 1845 07/28/17 0330 07/29/17 0500 07/30/17 0531 08/01/17 0500  WBC 9.6 8.7 5.5 4.1 4.6  HGB 7.6* 7.8* 7.3* 8.4* 8.3*  HCT 23.3* 24.0* 22.5* 25.1* 25.4*  MCV 89.3 87.9 87.9 87.8 88.8  PLT 91* 85* 67* 46* 45*    Basic Metabolic Panel:  Recent Labs Lab 07/26/17 0315  07/28/17 0330 07/29/17 0500 07/30/17 0531 07/31/17 0612 08/01/17 0500  NA 147*  < > 145 146* 146* 145 149*  K 3.3*  < > 2.8* 3.0* 2.7* 2.7* 2.8*  CL 115*  < > 105 108 110 111 115*  CO2 21*  < > '30 29 27 24 26  ' GLUCOSE 213*  < > 101* 92 90 93 79  BUN 74*  < > 55* 53* 49* 48* 47*  CREATININE 4.75*  < > 3.68* 3.53* 3.33* 3.25* 3.04*  CALCIUM 8.7*  < > 8.1* 8.1* 8.0* 8.2* 8.3*  MG 1.2*  --  1.0* 2.0 1.8  --  1.8  PHOS 2.3*  --  2.3* 3.4 3.7  --   --   < > = values in this interval not displayed.  Recent Results (from the past 240 hour(s))  Blood culture (routine x 2)     Status: None   Collection Time: 07/24/17 10:56 AM  Result Value Ref Range Status   Specimen Description BLOOD RIGHT ANTECUBITAL  Final   Special Requests   Final    BOTTLES DRAWN AEROBIC AND ANAEROBIC Blood Culture adequate volume   Culture   Final    NO GROWTH 5 DAYS Performed at [facility], [address] [place], [place] [address]    Report Status 07/29/2017 FINAL  Final  Blood culture (routine x 2)     Status: Abnormal   Collection Time: 07/24/17 12:26 PM  Result Value Ref Range Status   Specimen Description BLOOD LEFT ANTECUBITAL  Final   Special Requests   Final    BOTTLES DRAWN AEROBIC AND ANAEROBIC Blood Culture adequate volume   Culture  Setup Time    Final    GRAM POSITIVE COCCI IN CLUSTERS ANAEROBIC BOTTLE ONLY CRITICAL RESULT CALLED TO, READ BACK BY AND VERIFIED WITH: M.LILLISTON PHARMD 0840 [id number] HDAY    Culture (A)  Final    STAPHYLOCOCCUS SPECIES (COAGULASE NEGATIVE) THE SIGNIFICANCE OF ISOLATING THIS ORGANISM FROM A SINGLE SET OF BLOOD CULTURES WHEN MULTIPLE SETS ARE DRAWN IS UNCERTAIN. PLEASE NOTIFY THE MICROBIOLOGY DEPARTMENT WITHIN ONE WEEK IF SPECIATION AND SENSITIVITIES ARE REQUIRED. Performed at [facility], [place] [address] [place], [place] [address]    Report Status 07/28/2017 FINAL  Final  Blood Culture ID Panel (Reflexed)     Status: Abnormal   Collection Time: 07/24/17 12:26 PM  Result Value Ref Range Status   Enterococcus species NOT DETECTED NOT DETECTED Final   Listeria monocytogenes NOT DETECTED NOT DETECTED Final   Staphylococcus species DETECTED (A) NOT DETECTED Final    Comment: Methicillin (oxacillin) resistant coagulase negative staphylococcus. Possible blood culture contaminant (unless isolated from more than one blood culture draw or clinical case suggests pathogenicity). No antibiotic treatment is indicated for blood  culture contaminants. CRITICAL RESULT CALLED TO, READ BACK BY AND VERIFIED WITH: M.LILLISTON PHARMD 0840 [id number] HDAY    Staphylococcus aureus NOT DETECTED NOT DETECTED Final   Methicillin resistance DETECTED (A) NOT DETECTED Final    Comment: CRITICAL RESULT CALLED TO, READ BACK BY AND VERIFIED WITH: M.LILLISTON PHARMD [id number] HDAY    Streptococcus species NOT DETECTED NOT DETECTED Final   Streptococcus agalactiae NOT DETECTED NOT DETECTED Final   Streptococcus pneumoniae NOT DETECTED NOT DETECTED Final   Streptococcus pyogenes NOT DETECTED NOT DETECTED Final   Acinetobacter baumannii NOT DETECTED NOT DETECTED Final   Enterobacteriaceae species NOT DETECTED NOT DETECTED Final   Enterobacter cloacae complex NOT DETECTED NOT DETECTED Final   Escherichia coli NOT DETECTED NOT  DETECTED Final   Klebsiella oxytoca NOT DETECTED NOT DETECTED Final   Klebsiella pneumoniae  NOT DETECTED NOT DETECTED Final   Proteus species NOT DETECTED NOT DETECTED Final   Serratia marcescens NOT DETECTED NOT DETECTED Final   Haemophilus influenzae NOT DETECTED NOT DETECTED Final   Neisseria meningitidis NOT DETECTED NOT DETECTED Final   Pseudomonas aeruginosa NOT DETECTED NOT DETECTED Final   Candida albicans NOT DETECTED NOT DETECTED Final   Candida glabrata NOT DETECTED NOT DETECTED Final   Candida krusei NOT DETECTED NOT DETECTED Final   Candida parapsilosis NOT DETECTED NOT DETECTED Final   Candida tropicalis NOT DETECTED NOT DETECTED Final    Comment: Performed at [facility], [place] [address], [place], [place] [address]  Culture, Urine     Status: Abnormal   Collection Time: 07/24/17 12:28 PM  Result Value Ref Range Status   Specimen Description Urine  Final   Special Requests NONE  Final   Culture (A)  Final    20,000 COLONIES/mL ESCHERICHIA COLI Confirmed Extended Spectrum Beta-Lactamase Producer (ESBL) Performed at [facility], [place] [address] [place], [place] [address]    Report Status 07/27/2017 FINAL  Final   Organism ID, Bacteria ESCHERICHIA COLI (A)  Final      Susceptibility   Escherichia coli - MIC*    AMPICILLIN >=32 RESISTANT Resistant     CEFAZOLIN >=64 RESISTANT Resistant     CEFEPIME >=64 RESISTANT Resistant     CEFTAZIDIME >=64 RESISTANT Resistant     CEFTRIAXONE >=64 RESISTANT Resistant     CIPROFLOXACIN >=4 RESISTANT Resistant     GENTAMICIN >=16 RESISTANT Resistant     IMIPENEM <=0.25 SENSITIVE Sensitive     TRIMETH/SULFA <=20 SENSITIVE Sensitive     AMPICILLIN/SULBACTAM >=32 RESISTANT Resistant     PIP/TAZO 64 INTERMEDIATE Intermediate     Extended ESBL POSITIVE Resistant     * 20,000 COLONIES/mL ESCHERICHIA COLI  MRSA PCR Screening     Status: Abnormal   Collection Time: 07/24/17  4:25 PM  Result Value Ref Range Status   MRSA by  PCR POSITIVE (A) NEGATIVE Final    Comment:        The GeneXpert MRSA Assay (FDA approved for NASAL specimens only), is one component of a comprehensive MRSA colonization surveillance program. It is not intended to diagnose MRSA infection nor to guide or monitor treatment for MRSA infections. RESULT CALLED TO, READ BACK BY AND VERIFIED WITH: S.ODOM RN AT 2015 ON 07/24/17 BY S.VANHOORNE MLS   C difficile quick scan w PCR reflex     Status: None   Collection Time: 07/26/17  6:25 PM  Result Value Ref Range Status   C Diff antigen NEGATIVE NEGATIVE Final   C Diff toxin NEGATIVE NEGATIVE Final   C Diff interpretation No C. difficile detected.  Final  Culture, blood (Routine X 2) w Reflex to ID Panel     Status: None (Preliminary result)   Collection Time: 07/28/17  6:52 PM  Result Value Ref Range Status   Specimen Description BLOOD RIGHT ARM  Final   Special Requests IN PEDIATRIC BOTTLE Blood Culture adequate volume  Final   Culture   Final    NO GROWTH 3 DAYS Performed at [facility], [place] [address], [place], [place] [address]    Report Status PENDING  Incomplete  Culture, blood (Routine X 2) w Reflex to ID Panel     Status: None (Preliminary result)   Collection Time: 07/28/17  6:52 PM  Result Value Ref Range Status   Specimen Description BLOOD RIGHT HAND  Final   Special Requests AEROBIC BOTTLE ONLY Blood Culture adequate volume  Final   Culture   Final    NO GROWTH 3 DAYS Performed at [facility], [address], [place], [place] [address]    Report Status PENDING  Incomplete     Liver Function Tests: No results for input(s): AST, ALT, ALKPHOS, BILITOT, PROT, ALBUMIN in the last 168 hours. No results for input(s): LIPASE, AMYLASE in the last 168 hours. No results for input(s): AMMONIA in the last 168 hours.  Cardiac Enzymes: No results for input(s): CKTOTAL, CKMB, CKMBINDEX, TROPONINI in the last 168 hours. BNP (last 3 results)  Recent Labs   07/24/17 1057  BNP 160.4*    ProBNP (last 3 results) No results for input(s): PROBNP in the last 8760 hours.    Studies: No results found.  Scheduled Meds: . chlorhexidine  15 mL Mouth Rinse BID  . Chlorhexidine Gluconate Cloth  6 each Topical Daily  . famotidine  20 mg Oral Daily  . insulin aspart  0-15 Units Subcutaneous TID WC  . insulin aspart  0-5 Units Subcutaneous QHS  . lidocaine  15 mL Mouth/Throat TID  . mouth rinse  15 mL Mouth Rinse BID  . methimazole  5 mg Oral Daily  . sodium chloride flush  10-40 mL Intracatheter Q12H      Time spent: 25 min  [facility] Pager [phone]. If 7PM-7AM, please contact night-coverage at [email or web address], Office  [phone]  password [place]  08/01/2017, 3:10 PM  LOS: 8 days

## 2017-08-01 NOTE — Progress Notes (Signed)
CSW following to assist with patient discharge to residential Hospice. CSW met with patient sister Hinton Dyer at bedside, explain role and reason for visit. Patient sister explain the family is wanting Database administrator. CSW explain process to finding placement and looking into other residential facility if beds are not available at [facility]. Patient sister reports understanding. She is agreeable to [facility] as options.  CSW contacted Cassandra at [facility], and provided her w/ pt. Information and referral. She reports there are no beds at this time, and will call in the a.m if something is available. CSW updated patient daughter.  CSW will continue to assist with patient discharge to residential hospice.   Kathrin Greathouse, Latanya Presser, MSW Clinical Social Worker 5E and Psychiatric Service Line [phone] 08/01/2017  4:32 PM

## 2017-08-01 NOTE — Care Management Note (Signed)
Case Management Note  Patient Details  Name: Berneda Roseamela Finger MRN: [id number] Date of Birth: 02/01/1946  Subjective/Objective:                  Hypokalemia,thrombocytopenia,tube feeding for airway protection,bld cults pending,  Action/Plan: Date:  August 01, 2017 Chart reviewed for concurrent status and case management needs. Will continue to follow patient progress. Discharge Planning: following for needs Expected discharge date: [id number] Marcelle SmilingRhonda Desree Leap, BSN, [place]3, [place]   [phone]  Expected Discharge Date:   (UNKNOWN)               Expected Discharge Plan:  Skilled Nursing Facility  In-House Referral:  Clinical Social Work  Discharge planning Services  CM Consult  Post Acute Care Choice:    Choice offered to:     DME Arranged:    DME Agency:     HH Arranged:    HH Agency:     Status of Service:  In process, will continue to follow  If discussed at Long Length of Stay Meetings, dates discussed:    Additional Comments:  Golda AcreDavis, Daenerys Buttram Lynn, RN 08/01/2017, 11:28 AM

## 2017-08-01 NOTE — Consult Note (Signed)
Consultation Note Date: 08/01/2017   Patient Name: Katie Evans  DOB: [birth year]  MRN: [id number]  Age / Sex: 71 y.o., female  PCP: No primary care provider on file. Referring Physician: Oswald Hillock, MD  Reason for Consultation: Establishing goals of care  HPI/Patient Profile: 71 y.o. female  admitted on 07/24/2017  71 yo female from SNF, with history of gradual progressive decline for few months, underlying chronic diastolic CHF, III CKD, PE was on Xarelto, depression, admitted with altered mental status, septic shock, E Coli UTI. Initially required to be in step down, was also on pressors, patient has since been on medical floor, ongoing dysphagia, seen by speech, known to have delayed swallow across all consistencies, patient how ever with minimal to nil PO intake.   A palliative consult has been requested for goals of care discussions.   Clinical Assessment and Goals of Care:  The patient is resting in bed, she is awake alert, appears chronically ill, she does not verbalize much with me. Call placed and then I met with sister HCPOA Ms Katie Evans at the bedside. The patient's sister brought flowers for her, the patient has always liked flowers, she never married, has no kids. Sister Ms Katie Evans is under significant caregiver burden, she is the primary decision maker, the patient's mother is 53 years old, in a nursing facility, the patient has ongoing atrophy of the brain, she has not been doing well for few months at her nursing home, sister states she developed aspiration issues since the past few months.   We discussed about palliative care being a specialized medical care for people living with serious illness. It focuses on providing relief from the symptoms and stress of a serious illness. The goal is to improve quality of life for both the patient and the family.  Additionally, goals wishes and vales  discussed with sister as patient did not participate, we then discussed about hospice philosophy of care, patient's sister did not wish to place the patient in another SNF at this time, the patient is not eating well, has several underlying morbidities, hence, the patient's sister believes that inpatient hospice, comfort measures and comfort feeds would best represent the patient's wishes.   See recommendations below, thank you for the consult.   NEXT OF KIN  sister Katie Evans is her next of kin and her POA, patient has never married, has no kids, her mother is 22 years old, mother is in a nursing home. Her another sister as well.   SUMMARY OF RECOMMENDATIONS   DNR DNI NO PEG Comfort care Comfort feeds, recognizing aspiration risk No PICC Residential hospice: sister Katie Evans, Katie Evans. Discussed with CSW  Code Status/Advance Care Planning:  DNR    Symptom Management:    as above   Palliative Prophylaxis:   Bowel Regimen and Delirium Protocol  Additional Recommendations (Limitations, Scope, Preferences):  Full Comfort Care  Psycho-social/Spiritual:   Desire for further Chaplaincy support:yes  Additional Recommendations: Education on Hospice  Prognosis:   <  2 weeks  Discharge Planning: Hospice facility      Primary Diagnoses: Present on Admission: . AKI (acute kidney injury) ([place]) . Anemia of chronic disease . Chronic pulmonary embolism ([place]) . Diastolic CHF, acute on chronic ([facility]) . Essential hypertension . Protein-calorie malnutrition ([place]) . Pulmonary hypertension ([place]) . Hypotension   I have reviewed the medical record, interviewed the patient and family, and examined the patient. The following aspects are pertinent.  Past Medical History:  Diagnosis Date  . Allergic rhinitis   . Anemia   . Anxiety   . Arthritis   . Bruising   . Carpal tunnel syndrome   . CHF (congestive heart failure) ([place])   . Chronic diarrhea    . Chronic pulmonary embolism ([place])   . Demand ischemia of myocardium ([place])   . Flatulence   . Generalized anxiety disorder   . GERD without esophagitis   . Hemorrhoids   . Hyperlipidemia   . Hypertension   . Hyperthyroidism   . Hypokalemia   . Long term current use of anticoagulant therapy   . Major depressive disorder   . Metabolic encephalopathy   . Overactive bladder   . Protein calorie malnutrition ([place])   . Psychosis   . Renal disorder   . RLS (restless legs syndrome)   . Thrombocytopathia ([place])   . Thrombocytopenia ([place])   . Vaginal atrophy    Social History   Social History  . Marital status: Single    Spouse name: N/A  . Number of children: N/A  . Years of education: N/A   Social History Main Topics  . Smoking status: Never Smoker  . Smokeless tobacco: Never Used  . Alcohol use No  . Drug use: No  . Sexual activity: Not Asked   Other Topics Concern  . None   Social History Narrative  . None   Family History  Problem Relation Age of Onset  . Thyroid disease Neg Hx    Scheduled Meds: . chlorhexidine  15 mL Mouth Rinse BID  . Chlorhexidine Gluconate Cloth  6 each Topical Daily  . famotidine  20 mg Oral Daily  . insulin aspart  0-15 Units Subcutaneous TID WC  . insulin aspart  0-5 Units Subcutaneous QHS  . lidocaine  15 mL Mouth/Throat TID  . mouth rinse  15 mL Mouth Rinse BID  . methimazole  5 mg Oral Daily  . sodium chloride flush  10-40 mL Intracatheter Q12H   Continuous Infusions: . sodium chloride    . dextrose 5 % and 0.45% NaCl 50 mL/hr at 08/01/17 1005  . fluconazole (DIFLUCAN) IV Stopped (07/31/17 1817)  . magnesium sulfate 1 - 4 g bolus IVPB    . meropenem (MERREM) IV Stopped (08/01/17 0319)  . potassium chloride 10 mEq (08/01/17 1123)   PRN Meds:.[CANCELED] Place/Maintain arterial line **AND** sodium chloride, acetaminophen **OR** acetaminophen, morphine injection, RESOURCE THICKENUP CLEAR, sodium chloride flush Medications Prior to  Admission:  Prior to Admission medications   Medication Sig Start Date End Date Taking? Authorizing Provider  aspirin EC 81 MG tablet Take 81 mg by mouth daily.   Yes [provider]  atorvastatin (LIPITOR) 20 MG tablet Take 20 mg by mouth daily.   Yes [provider]  benzocaine (ORAL ANALGESIC MAX ST) 20 % GEL Use as directed 1 application in the mouth or throat 3 (three) times daily.   Yes [provider]  Cholecalciferol (D3-1000 PO) Take 1,000 Units by mouth daily.    Yes  [provider]  ciprofloxacin (CIPRO) 250 MG tablet Take 250 mg by mouth 2 (two) times daily.   Yes [provider]  cyclobenzaprine (FLEXERIL) 5 MG tablet Take 5 mg by mouth 3 (three) times daily as needed for muscle spasms.   Yes [provider]  famotidine (PEPCID) 20 MG tablet Take 20 mg by mouth daily.    Yes [provider]  HYDROcodone-acetaminophen (NORCO/VICODIN) 5-325 MG tablet Take 1-2 tablets by mouth every 4 (four) hours as needed for moderate pain.   Yes [provider]  Liniments (SALONPAS EX) Apply 1 patch topically daily as needed (Pain).   Yes [provider]  loperamide (IMODIUM) 2 MG capsule Take 2 mg by mouth 4 (four) times daily as needed for diarrhea or loose stools.    Yes [provider]  Menthol, Topical Analgesic, (ICY HOT EX) Apply 1 application topically. Apply 7.5% cream to both shoulders at 9AM, 3PM, 9PM.  Hold for skin irritation.   Yes [provider]  methimazole (TAPAZOLE) 5 MG tablet Take 1 tablet (5 mg total) by mouth daily. 06/06/17  Yes Renato Shin, MD  nadolol (CORGARD) 40 MG tablet Take 40 mg by mouth daily.    Yes [provider]  nitroGLYCERIN (NITROSTAT) 0.4 MG SL tablet Place 0.4 mg under the tongue every 5 (five) minutes as needed for chest pain. For 3 doses as needed for chest pain   Yes [provider]  OVER THE COUNTER MEDICATION Take 120 mLs by mouth 2 (two)  times daily. MeddPass   Yes [provider]  oxybutynin (DITROPAN) 5 MG tablet Take 5 mg by mouth daily.    Yes [provider]  potassium chloride SA (K-DUR,KLOR-CON) 20 MEQ tablet Take 20 mEq by mouth 3 (three) times daily. Take with food.  Do not crush.  May dissolve.   Yes [provider]  pramipexole (MIRAPEX) 1.5 MG tablet Take 1.5 mg by mouth 2 (two) times daily.    Yes [provider]  PRESCRIPTION MEDICATION Apply 1 application topically 2 (two) times daily. Triple Cream 1:1:1, Nystatin-Zinc-Tac 0.1%. Applied to bilateral inner groins, inner thighs, bilateral buttocks, posterior thighs.   Yes [provider]  rivaroxaban (XARELTO) 10 MG TABS tablet Take 10 mg by mouth daily.   Yes [provider]  sertraline (ZOLOFT) 100 MG tablet Take 200 mg by mouth daily. Take two 100 mg tablets to = 200 mg po QD for depression   Yes [provider]  shark liver oil-cocoa butter (PREPARATION H) 0.25-3-85.5 % suppository Place 1 suppository rectally 2 (two) times daily as needed for hemorrhoids.   Yes [provider]  topiramate (TOPAMAX) 50 MG tablet Take 50 mg by mouth 2 (two) times daily.    Yes [provider]  torsemide (DEMADEX) 20 MG tablet Take 20 mg by mouth daily.    Yes [provider]   Allergies  Allergen Reactions  . Remeron [Mirtazapine]     Per MAR. No rxn listed   Review of Systems +generalized weakness  Physical Exam Weak chronically ill appearing female Has central line in neck IJ Shallow clear breath sounds anteriorly S1 S2 Abdomen soft Trace edema Awake alert, is able to nod her head yes/no, listens carefully, just does not verbalize with me.   Vital Signs: BP 96/79 (BP Location: Right Arm)   Pulse 63   Temp 97.8 F (36.6 C) (Axillary)   Resp 18   Ht '5\' 4"'  (1.626 m)  Wt 84.8 kg (187 lb)   SpO2 97%   BMI 32.10 kg/m  Pain Assessment: Faces POSS *See Group Information*:  S-Acceptable,Sleep, easy to arouse Pain Score: Asleep   SpO2: SpO2: 97 % O2 Device:SpO2: 97 % O2 Flow Rate: .O2 Flow Rate (L/min): 2 L/min  IO: Intake/output summary:  Intake/Output Summary (Last 24 hours) at 08/01/17 1208 Last data filed at 08/01/17 0900  Gross per 24 hour  Intake              590 ml  Output              300 ml  Net              290 ml    LBM: Last BM Date: 07/31/17 Baseline Weight: Weight: 84.8 kg (187 lb) Most recent weight: Weight: 84.8 kg (187 lb)     Palliative Assessment/Data:     Time In:  10 Time Out:  11.10 Time Total:  70 min  Greater than 50%  of this time was spent counseling and coordinating care related to the above assessment and plan.  Signed by: Loistine Chance, MD  [phone]  Please contact Palliative Medicine Team phone at [phone] for questions and concerns.  For individual provider: See Shea Evans

## 2017-08-02 DIAGNOSIS — Z7901 Long term (current) use of anticoagulants: Secondary | ICD-10-CM

## 2017-08-02 LAB — GLUCOSE, CAPILLARY
GLUCOSE-CAPILLARY: 72 mg/dL (ref 65–99)
GLUCOSE-CAPILLARY: 95 mg/dL (ref 65–99)
Glucose-Capillary: 88 mg/dL (ref 65–99)
Glucose-Capillary: 75 mg/dL (ref 65–99)

## 2017-08-02 LAB — CREATININE, SERUM
Creatinine, Ser: 2.82 mg/dL — ABNORMAL HIGH (ref 0.44–1.00)
GFR calc non Af Amer: 16 mL/min — ABNORMAL LOW (ref >60)
GFR, EST AFRICAN AMERICAN: 19 mL/min — AB (ref >60)

## 2017-08-02 MED ORDER — RANITIDINE HCL 150 MG/10ML PO SYRP
150.0000 mg | ORAL_SOLUTION | Freq: Every day | ORAL | Status: DC
  Administered 2017-08-02 – 2017-08-05 (×4): 150 mg via ORAL
  Filled 2017-08-02 (×4): qty 10

## 2017-08-02 NOTE — Progress Notes (Signed)
Nutrition Brief Note  Palliative spoke with sister. The decision was made for pt to go comfort care/comfort feedings. No wishes for PEG or PICC line. Clinical nutrition to sign off at this time.   If nutrition issues arise, please re-consult RD.   Vanessa Kickarly Glorian Mcdonell RD, LDN Clinical Nutrition Pager # [phone]- [phone]

## 2017-08-02 NOTE — Progress Notes (Signed)
[facility]  PROGRESS NOTE  Katie Evans MWN:[id number] DOB: August 03, 1946 DOA: 07/24/2017 PCP: No primary care provider on file.   Brief HPI:    71 year old female with history of chronic diastolic CHF, CK D stage III, pulmonary embolism on anticoagulation with Xarelto, depression worsened from skilled facility for altered mental status. Patient was hypotensive, found to be in septic shock from Escherichia coli UTI. Patient was admitted to ICU under [facility] M service. Patient was started on pressor support with Levophed.    Subjective   Patient seen and examined, no new complaints. Family has opted for comfort measures only. Patient awaiting hospice placement   Assessment/Plan:     1. Septic shock-secondary to UTI, resolved. Patient is off Levophed. Culture growing ESBL E coli, sensitive to imipenem. PatientWas started on meropenem. Blood cultures growing: Coagulase negative staph in 1 out of 2 bottles. 2. Acute kidney injury-patient space and creatinine is 1.3 as of 04/17/2017, she presented with creatinine of 7.52. Slow improvement in creatinine today it is 2.82 3. Dysphagia- patient has moderate oropharyngeal dysphagia, was seen by speech therapy, patient continues to have delayed swallow across consistencies. Currently she is on nectar thick liquid diet. Palliative care consulted for goals of care. 4. History of pulmonary embolism-patient was on Xarelto at home, which was held at the time of admission. Patient's platelet count is 46,000. Hold Lovenox. Will restart Lovenox once count is greater than 100,000. 5. Oropharyngeal candidiasis- as per speech therapy note, patient was started on Diflucan 100 mg IV daily. Will discontinue Diflucan as patient is currently comfort measures only 6. Acute metabolic encephalopathy - resolved, patient back to baseline. MRI shows no CVA.  EEG is consistent with a generalized nonspecific cerebral dysfunction(encephalopathy). There was no seizure or  seizure predisposition recorded on this study.  7.  Thrombocytopenia - likely from sepsis.  8.  Hyperthyroidism - TSH within normal limits, continue methimazole.  9.  Diabetes mellitus-continue sliding scale insulin with NovoLog. 10. Anemia of chronic disease- hemoglobin is 8.4.  11.  Goals of care discussion-  Palliative care met with patient and her family,  after discussion family opted for comfort measures only. Social work consulted for residential hospice. Will not repeat labs in am. Antibiotics have been discontinued. We will also discontinue Diflucan.    DVT prophylaxis: Start on Lovenox  Code Status: DO NOT RESUSCITATE  Family Communication: No family at bedside   Disposition Plan: To be determined   Consultants:  [facility] M  Procedures: STUDIES:  CT head 8/29 > Possible moderate acute infarct in the lateral left frontal lobe. Certainty is diminished by artifact from patient positioning. Chronic microvascular ischemia. Chronic left maxillary sinusitis. CT abd 8/29 > No acute intra-abdominal process. Large stool ball in the rectum. Correlate for fecal impaction. Small amount of patchy centrilobular nodularity in the left lower lobe, which could reflect aspiration. 8/31 renal us>>  CULTURES: Blood 8/29 >staph mrsa>> Urine 8/30 >  ANTIBIOTICS: Zosyn 8/29 > Vanco 8/30 >  SIGNIFICANT EVENTS: 8/29 admit 8/30 tx to ICU for pressors.   LINES/TUBES: RIJ CVL 8/30 >  Continuous infusions . sodium chloride    . dextrose 5 % and 0.45% NaCl 50 mL/hr at 08/01/17 1005      Antibiotics:   Anti-infectives    Start     Dose/Rate Route Frequency Ordered Stop   07/31/17 1800  fluconazole (DIFLUCAN) IVPB 100 mg  Status:  Discontinued     100 mg 50 mL/hr over 60 Minutes Intravenous Every 24 hours  07/31/17 1612 08/02/17 0857   07/30/17 0800  vancomycin (VANCOCIN) IVPB 1000 mg/200 mL premix     1,000 mg 200 mL/hr over 60 Minutes Intravenous  Once 07/30/17 0645 07/30/17 0904    07/27/17 1430  meropenem (MERREM) 1 g in sodium chloride 0.9 % 100 mL IVPB  Status:  Discontinued     1 g 200 mL/hr over 30 Minutes Intravenous Every 12 hours 07/27/17 1410 08/02/17 0857   07/27/17 0700  vancomycin (VANCOCIN) IVPB 1000 mg/200 mL premix     1,000 mg 200 mL/hr over 60 Minutes Intravenous  Once 07/27/17 0647 07/27/17 0830   07/26/17 1100  piperacillin-tazobactam (ZOSYN) IVPB 2.25 g  Status:  Discontinued     2.25 g 100 mL/hr over 30 Minutes Intravenous Every 6 hours 07/26/17 0931 07/27/17 1409   07/25/17 0545  vancomycin (VANCOCIN) IVPB 1000 mg/200 mL premix     1,000 mg 200 mL/hr over 60 Minutes Intravenous  Once 07/25/17 0540 07/25/17 0740   07/24/17 2157  piperacillin-tazobactam (ZOSYN) IVPB 2.25 g  Status:  Discontinued     2.25 g 100 mL/hr over 30 Minutes Intravenous Every 8 hours 07/24/17 1504 07/26/17 0931   07/24/17 1330  piperacillin-tazobactam (ZOSYN) IVPB 2.25 g  Status:  Discontinued     2.25 g 100 mL/hr over 30 Minutes Intravenous Every 6 hours 07/24/17 1317 07/24/17 1504       Objective   Vitals:   08/01/17 1325 08/01/17 2142 08/02/17 0614 08/02/17 1359  BP: (!) 84/53 92/63 130/87 105/64  Pulse: 63 61 79 61  Resp: '16 16 16 18  ' Temp: 97.6 F (36.4 C) 97.8 F (36.6 C) (!) 97.5 F (36.4 C) (!) 97.5 F (36.4 C)  TempSrc: Axillary Oral Oral Axillary  SpO2: 100% 94% 97% 100%  Weight:      Height:        Intake/Output Summary (Last 24 hours) at 08/02/17 1550 Last data filed at 08/02/17 0729  Gross per 24 hour  Intake              586 ml  Output              775 ml  Net             -189 ml   Filed Weights   07/24/17 1043  Weight: 84.8 kg (187 lb)     Physical Examination:   Physical Exam: Eyes: No icterus, extraocular muscles intact  Mouth: Oral mucosa is moist,  Neck: Supple, no deformities, masses, or tenderness Lungs: Normal respiratory effort, bilateral clear to auscultation, no crackles or wheezes.  Heart: Regular rate and  rhythm, S1 and S2 normal, no murmurs, rubs auscultated Abdomen: BS normoactive,soft,nondistended,non-tender to palpation,no organomegaly Extremities: No pretibial edema, no erythema, no cyanosis, no clubbing Neuro : Alert, Nonverbal        Data Reviewed: I have personally reviewed following labs and imaging studies  CBG:  Recent Labs Lab 08/01/17 1158 08/01/17 1704 08/01/17 2212 08/02/17 0721 08/02/17 1120  GLUCAP 83 72 75 72 88    CBC:  Recent Labs Lab 07/26/17 1845 07/28/17 0330 07/29/17 0500 07/30/17 0531 08/01/17 0500  WBC 9.6 8.7 5.5 4.1 4.6  HGB 7.6* 7.8* 7.3* 8.4* 8.3*  HCT 23.3* 24.0* 22.5* 25.1* 25.4*  MCV 89.3 87.9 87.9 87.8 88.8  PLT 91* 85* 67* 46* 45*    Basic Metabolic Panel:  Recent Labs Lab 07/28/17 0330 07/29/17 0500 07/30/17 0531 07/31/17 0612 08/01/17 0500 08/02/17 0445  NA 145 146*  146* 145 149*  --   K 2.8* 3.0* 2.7* 2.7* 2.8*  --   CL 105 108 110 111 115*  --   CO2 '30 29 27 24 26  ' --   GLUCOSE 101* 92 90 93 79  --   BUN 55* 53* 49* 48* 47*  --   CREATININE 3.68* 3.53* 3.33* 3.25* 3.04* 2.82*  CALCIUM 8.1* 8.1* 8.0* 8.2* 8.3*  --   MG 1.0* 2.0 1.8  --  1.8  --   PHOS 2.3* 3.4 3.7  --   --   --     Recent Results (from the past 240 hour(s))  Blood culture (routine x 2)     Status: None   Collection Time: 07/24/17 10:56 AM  Result Value Ref Range Status   Specimen Description BLOOD RIGHT ANTECUBITAL  Final   Special Requests   Final    BOTTLES DRAWN AEROBIC AND ANAEROBIC Blood Culture adequate volume   Culture   Final    NO GROWTH 5 DAYS Performed at [facility], [place] [address] [place], [place] [address]    Report Status 07/29/2017 FINAL  Final  Blood culture (routine x 2)     Status: Abnormal   Collection Time: 07/24/17 12:26 PM  Result Value Ref Range Status   Specimen Description BLOOD LEFT ANTECUBITAL  Final   Special Requests   Final    BOTTLES DRAWN AEROBIC AND ANAEROBIC Blood Culture adequate volume    Culture  Setup Time   Final    GRAM POSITIVE COCCI IN CLUSTERS ANAEROBIC BOTTLE ONLY CRITICAL RESULT CALLED TO, READ BACK BY AND VERIFIED WITH: M.LILLISTON PHARMD [id number] HDAY    Culture (A)  Final    STAPHYLOCOCCUS SPECIES (COAGULASE NEGATIVE) THE SIGNIFICANCE OF ISOLATING THIS ORGANISM FROM A SINGLE SET OF BLOOD CULTURES WHEN MULTIPLE SETS ARE DRAWN IS UNCERTAIN. PLEASE NOTIFY THE MICROBIOLOGY DEPARTMENT WITHIN ONE WEEK IF SPECIATION AND SENSITIVITIES ARE REQUIRED. Performed at [facility], [place] [address], [place], [place] [address]    Report Status 07/28/2017 FINAL  Final  Blood Culture ID Panel (Reflexed)     Status: Abnormal   Collection Time: 07/24/17 12:26 PM  Result Value Ref Range Status   Enterococcus species NOT DETECTED NOT DETECTED Final   Listeria monocytogenes NOT DETECTED NOT DETECTED Final   Staphylococcus species DETECTED (A) NOT DETECTED Final    Comment: Methicillin (oxacillin) resistant coagulase negative staphylococcus. Possible blood culture contaminant (unless isolated from more than one blood culture draw or clinical case suggests pathogenicity). No antibiotic treatment is indicated for blood  culture contaminants. CRITICAL RESULT CALLED TO, READ BACK BY AND VERIFIED WITH: M.LILLISTON PHARMD [id number] HDAY    Staphylococcus aureus NOT DETECTED NOT DETECTED Final   Methicillin resistance DETECTED (A) NOT DETECTED Final    Comment: CRITICAL RESULT CALLED TO, READ BACK BY AND VERIFIED WITH: M.LILLISTON PHARMD 0840 [id number] HDAY    Streptococcus species NOT DETECTED NOT DETECTED Final   Streptococcus agalactiae NOT DETECTED NOT DETECTED Final   Streptococcus pneumoniae NOT DETECTED NOT DETECTED Final   Streptococcus pyogenes NOT DETECTED NOT DETECTED Final   Acinetobacter baumannii NOT DETECTED NOT DETECTED Final   Enterobacteriaceae species NOT DETECTED NOT DETECTED Final   Enterobacter cloacae complex NOT DETECTED NOT DETECTED Final   Escherichia  coli NOT DETECTED NOT DETECTED Final   Klebsiella oxytoca NOT DETECTED NOT DETECTED Final   Klebsiella pneumoniae NOT DETECTED NOT DETECTED Final   Proteus species NOT DETECTED NOT  DETECTED Final   Serratia marcescens NOT DETECTED NOT DETECTED Final   Haemophilus influenzae NOT DETECTED NOT DETECTED Final   Neisseria meningitidis NOT DETECTED NOT DETECTED Final   Pseudomonas aeruginosa NOT DETECTED NOT DETECTED Final   Candida albicans NOT DETECTED NOT DETECTED Final   Candida glabrata NOT DETECTED NOT DETECTED Final   Candida krusei NOT DETECTED NOT DETECTED Final   Candida parapsilosis NOT DETECTED NOT DETECTED Final   Candida tropicalis NOT DETECTED NOT DETECTED Final    Comment: Performed at [facility], [place] [address] [place], [place] [address]  Culture, Urine     Status: Abnormal   Collection Time: 07/24/17 12:28 PM  Result Value Ref Range Status   Specimen Description Urine  Final   Special Requests NONE  Final   Culture (A)  Final    20,000 COLONIES/mL ESCHERICHIA COLI Confirmed Extended Spectrum Beta-Lactamase Producer (ESBL) Performed at [facility], [place] [address], [place], [place] [address]    Report Status 07/27/2017 FINAL  Final   Organism ID, Bacteria ESCHERICHIA COLI (A)  Final      Susceptibility   Escherichia coli - MIC*    AMPICILLIN >=32 RESISTANT Resistant     CEFAZOLIN >=64 RESISTANT Resistant     CEFEPIME >=64 RESISTANT Resistant     CEFTAZIDIME >=64 RESISTANT Resistant     CEFTRIAXONE >=64 RESISTANT Resistant     CIPROFLOXACIN >=4 RESISTANT Resistant     GENTAMICIN >=16 RESISTANT Resistant     IMIPENEM <=0.25 SENSITIVE Sensitive     TRIMETH/SULFA <=20 SENSITIVE Sensitive     AMPICILLIN/SULBACTAM >=32 RESISTANT Resistant     PIP/TAZO 64 INTERMEDIATE Intermediate     Extended ESBL POSITIVE Resistant     * 20,000 COLONIES/mL ESCHERICHIA COLI  MRSA PCR Screening     Status: Abnormal   Collection Time: 07/24/17  4:25 PM  Result Value Ref  Range Status   MRSA by PCR POSITIVE (A) NEGATIVE Final    Comment:        The GeneXpert MRSA Assay (FDA approved for NASAL specimens only), is one component of a comprehensive MRSA colonization surveillance program. It is not intended to diagnose MRSA infection nor to guide or monitor treatment for MRSA infections. RESULT CALLED TO, READ BACK BY AND VERIFIED WITH: S.ODOM RN AT 2015 ON 07/24/17 BY S.VANHOORNE MLS   C difficile quick scan w PCR reflex     Status: None   Collection Time: 07/26/17  6:25 PM  Result Value Ref Range Status   C Diff antigen NEGATIVE NEGATIVE Final   C Diff toxin NEGATIVE NEGATIVE Final   C Diff interpretation No C. difficile detected.  Final  Culture, blood (Routine X 2) w Reflex to ID Panel     Status: None (Preliminary result)   Collection Time: 07/28/17  6:52 PM  Result Value Ref Range Status   Specimen Description BLOOD RIGHT ARM  Final   Special Requests IN PEDIATRIC BOTTLE Blood Culture adequate volume  Final   Culture   Final    NO GROWTH 3 DAYS Performed at [facility], [place] [address], [place], [place] [address]    Report Status PENDING  Incomplete  Culture, blood (Routine X 2) w Reflex to ID Panel     Status: None (Preliminary result)   Collection Time: 07/28/17  6:52 PM  Result Value Ref Range Status   Specimen Description BLOOD RIGHT HAND  Final   Special Requests   Final  BOTTLES DRAWN AEROBIC ONLY Blood Culture adequate volume   Culture   Final    NO GROWTH 3 DAYS Performed at [facility], [place] [address], [place], [place] [address]    Report Status PENDING  Incomplete     Liver Function Tests: No results for input(s): AST, ALT, ALKPHOS, BILITOT, PROT, ALBUMIN in the last 168 hours. No results for input(s): LIPASE, AMYLASE in the last 168 hours. No results for input(s): AMMONIA in the last 168 hours.  Cardiac Enzymes: No results for input(s): CKTOTAL, CKMB, CKMBINDEX, TROPONINI in the last 168 hours. BNP (last 3  results)  Recent Labs  07/24/17 1057  BNP 160.4*    ProBNP (last 3 results) No results for input(s): PROBNP in the last 8760 hours.    Studies: No results found.  Scheduled Meds: . chlorhexidine  15 mL Mouth Rinse BID  . Chlorhexidine Gluconate Cloth  6 each Topical Daily  . insulin aspart  0-15 Units Subcutaneous TID WC  . insulin aspart  0-5 Units Subcutaneous QHS  . lidocaine  15 mL Mouth/Throat TID  . mouth rinse  15 mL Mouth Rinse BID  . methimazole  5 mg Oral Daily  . ranitidine  150 mg Oral Daily  . sodium chloride flush  10-40 mL Intracatheter Q12H      Time spent: 25 min  [facility] Pager [phone]. If 7PM-7AM, please contact night-coverage at [email or web address], Office  [phone]  password [email or web address]  08/02/2017, 3:50 PM  LOS: 9 days

## 2017-08-02 NOTE — Progress Notes (Signed)
Pharmacy Antibiotic Note  Katie Evans is a 71 y.o. female [facility] AMS.  Broad abx were started on admission for UTI/PNA/sepsis. UCX on 8/29 with ESBL ecoli --change zosyn to merrem on 9/1.  9/7 Day #7 effective antibiotic therapy -hypothermic - WBC WNL 9/6 - scr elevated but trending down (CrCl ~20 ml/min)   Plan: - continue merrem 1gm IV q12h - Consider d/c antibiotic after today's doses to complete 7-days of antibiotic therapy - f/u renal function  __________________________________  Height: 5\' 4"  (162.6 cm) Weight: 187 lb (84.8 kg) IBW/kg (Calculated) : 54.7  Temp (24hrs), Avg:97.6 F (36.4 C), Min:97.5 F (36.4 C), Max:97.8 F (36.6 C)   Recent Labs Lab 07/26/17 1845  07/28/17 0330  07/29/17 0500 07/30/17 0531 07/30/17 0532 07/31/17 0612 08/01/17 0500 08/02/17 0445  WBC 9.6  --  8.7  --  5.5 4.1  --   --  4.6  --   CREATININE  --   < > 3.68*  --  3.53* 3.33*  --  3.25* 3.04* 2.82*  VANCORANDOM  --   < >  --   < > 23  --  20  --   --   --   < > = values in this interval not displayed.  Estimated Creatinine Clearance: 20.1 mL/min (A) (by C-G formula based on SCr of 2.82 mg/dL (H)).    Allergies  Allergen Reactions  . Remeron [Mirtazapine]     Per MAR. No rxn listed   Antimicrobials this admission: 8/30 Vancomycin >>9/4 8/29 Zosyn >> 9/1 9/1 merrem>> 9/5 Fluconazole >>  Dose adjustments this admission: See Hx for vanc adj   Microbiology results: 8/29 BCx x2: 1/4 bottles with GPC in clusters (BCID= CoNS, MR)->? contaminant 8/29  UCx (not collected prior to 1st dose abx): 20K ESBL ecoli FINAL 8/29 MRSA PCR (+) 8/31 cdiff(-) 9/2 BCx: NGTD  Thank you for allowing pharmacy to be a part of this patient's care.  Juliette Alcideustin Zeigler, PharmD, BCPS.   Pager: [phone] 08/02/2017 7:31 AM

## 2017-08-02 NOTE — Progress Notes (Signed)
[facility]. Patient sister reports physician inform her patient could stay another night. CSW inform sister that she will need to choose another hospice home  if a bed is not available. Not sure if patient sister understands concept.  CSW will continue to follow.

## 2017-08-02 NOTE — Progress Notes (Signed)
SLP Cancellation Note  Patient Details Name: Katie Evans MRN: [id number] DOB: 04-Jul-1946   Cancelled treatment:       Reason Eval/Treat Not Completed: Other (comment) (rn reports pt for hospice, full comfort, will sign off)   Donavan Burnetamara Kellianne Ek, MS [facility] SLP [phone]

## 2017-08-03 DIAGNOSIS — Z7189 Other specified counseling: Secondary | ICD-10-CM

## 2017-08-03 LAB — CULTURE, BLOOD (ROUTINE X 2)
CULTURE: NO GROWTH
CULTURE: NO GROWTH
SPECIAL REQUESTS: ADEQUATE
Special Requests: ADEQUATE

## 2017-08-03 LAB — GLUCOSE, CAPILLARY
GLUCOSE-CAPILLARY: 73 mg/dL (ref 65–99)
GLUCOSE-CAPILLARY: 118 mg/dL — AB (ref 65–99)
GLUCOSE-CAPILLARY: 109 mg/dL — AB (ref 65–99)
GLUCOSE-CAPILLARY: 99 mg/dL (ref 65–99)

## 2017-08-03 NOTE — Progress Notes (Signed)
[facility]  PROGRESS NOTE  Katie Evans JSE:[id number] DOB: Jun 19, 1946 DOA: 07/24/2017 PCP: No primary care provider on file.   Brief HPI:    71 year old female with history of chronic diastolic CHF, CK D stage III, pulmonary embolism on anticoagulation with Xarelto, depression worsened from skilled facility for altered mental status. Patient was hypotensive, found to be in septic shock from Escherichia coli UTI. Patient was admitted to ICU under [facility] M service. Patient was started on pressor support with Levophed.    Subjective   Patient is responding better today.   Assessment/Plan:     1. Septic shock-secondary to UTI, resolved. Patient is off Levophed. Culture growing ESBL E coli, sensitive to imipenem. PatientWas started on meropenem. Blood cultures growing: Coagulase negative staph in 1 out of 2 bottles. 2. Acute kidney injury-patient space and creatinine is 1.3 as of 04/17/2017, she presented with creatinine of 7.52. Slow improvement in creatinine today it is 2.82 3. Dysphagia- patient has moderate oropharyngeal dysphagia, was seen by speech therapy, patient continues to have delayed swallow across consistencies. Currently she is on nectar thick liquid diet. Palliative care consulted for goals of care. 4. History of pulmonary embolism-patient was on Xarelto at home, which was held at the time of admission. Patient's platelet count is 46,000. Hold Lovenox. Will restart Lovenox once count is greater than 100,000. 5. Oropharyngeal candidiasis- as per speech therapy note, patient was started on Diflucan 100 mg IV daily. Will discontinue Diflucan as patient is currently comfort measures only 6. Acute metabolic encephalopathy - resolved, patient back to baseline. MRI shows no CVA.  EEG is consistent with a generalized nonspecific cerebral dysfunction(encephalopathy). There was no seizure or seizure predisposition recorded on this study.  7.  Thrombocytopenia - likely from sepsis.   8.  Hyperthyroidism - TSH within normal limits, continue methimazole.  9.  Diabetes mellitus-continue sliding scale insulin with NovoLog. 10. Anemia of chronic disease- hemoglobin is 8.4.  11.  Goals of care discussion-  Palliative care met with patient and her family,  after discussion family opted for comfort measures only. Social work consulted for residential hospice. Will not repeat labs in am. Antibiotics have been discontinued. Diflucan discontinued    DVT prophylaxis: Start on Lovenox  Code Status: DO NOT RESUSCITATE  Family Communication: No family at bedside   Disposition Plan: I called and discussed with patient's sister on phone, awaiting [facility] bed, if no bed available by Monday, we'll look for bed at [facility] place or [facility].   Consultants:  [facility] M  Procedures: STUDIES:  CT head 8/29 > Possible moderate acute infarct in the lateral left frontal lobe. Certainty is diminished by artifact from patient positioning. Chronic microvascular ischemia. Chronic left maxillary sinusitis. CT abd 8/29 > No acute intra-abdominal process. Large stool ball in the rectum. Correlate for fecal impaction. Small amount of patchy centrilobular nodularity in the left lower lobe, which could reflect aspiration. 8/31 renal us>>  CULTURES: Blood 8/29 >staph mrsa>> Urine 8/30 >  ANTIBIOTICS: Zosyn 8/29 > Vanco 8/30 >  SIGNIFICANT EVENTS: 8/29 admit 8/30 tx to ICU for pressors.   LINES/TUBES: RIJ CVL 8/30 >  Continuous infusions . sodium chloride    . dextrose 5 % and 0.45% NaCl 50 mL/hr at 08/03/17 0631      Antibiotics:   Anti-infectives    Start     Dose/Rate Route Frequency Ordered Stop   07/31/17 1800  fluconazole (DIFLUCAN) IVPB 100 mg  Status:  Discontinued  100 mg 50 mL/hr over 60 Minutes Intravenous Every 24 hours 07/31/17 1612 08/02/17 0857   07/30/17 0800  vancomycin (VANCOCIN) IVPB 1000 mg/200 mL premix     1,000 mg 200 mL/hr over 60  Minutes Intravenous  Once 07/30/17 0645 07/30/17 0904   07/27/17 1430  meropenem (MERREM) 1 g in sodium chloride 0.9 % 100 mL IVPB  Status:  Discontinued     1 g 200 mL/hr over 30 Minutes Intravenous Every 12 hours 07/27/17 1410 08/02/17 0857   07/27/17 0700  vancomycin (VANCOCIN) IVPB 1000 mg/200 mL premix     1,000 mg 200 mL/hr over 60 Minutes Intravenous  Once 07/27/17 0647 07/27/17 0830   07/26/17 1100  piperacillin-tazobactam (ZOSYN) IVPB 2.25 g  Status:  Discontinued     2.25 g 100 mL/hr over 30 Minutes Intravenous Every 6 hours 07/26/17 0931 07/27/17 1409   07/25/17 0545  vancomycin (VANCOCIN) IVPB 1000 mg/200 mL premix     1,000 mg 200 mL/hr over 60 Minutes Intravenous  Once 07/25/17 0540 07/25/17 0740   07/24/17 2157  piperacillin-tazobactam (ZOSYN) IVPB 2.25 g  Status:  Discontinued     2.25 g 100 mL/hr over 30 Minutes Intravenous Every 8 hours 07/24/17 1504 07/26/17 0931   07/24/17 1330  piperacillin-tazobactam (ZOSYN) IVPB 2.25 g  Status:  Discontinued     2.25 g 100 mL/hr over 30 Minutes Intravenous Every 6 hours 07/24/17 1317 07/24/17 1504       Objective   Vitals:   08/02/17 0614 08/02/17 1359 08/02/17 2115 08/03/17 0449  BP: 130/87 105/64 117/67 114/69  Pulse: 79 61 (!) 55 (!) 57  Resp: _0 Temp: (!) 97.5 F (36.4 C) (!) 97.5 F (36.4 C) (!) 97.5 F (36.4 C) (!) 97.2 F (36.2 C)  TempSrc: Oral Axillary Axillary Oral  SpO2: 97% 100% 94% 100%  Weight:      Height:        Intake/Output Summary (Last 24 hours) at 08/03/17 1320 Last data filed at 08/03/17 1000  Gross per 24 hour  Intake              270 ml  Output              875 ml  Net             -605 ml   Filed Weights   07/24/17 1043  Weight: 84.8 kg (187 lb)     Physical Examination:   Physical Exam: Eyes: No icterus, extraocular muscles intact  Mouth: Oral mucosa is moist, no lesions on palate,  Neck: Supple, no deformities, masses, or tenderness Lungs: Normal respiratory  effort, bilateral clear to auscultation, no crackles or wheezes.  Heart: Regular rate and rhythm, S1 and S2 normal, no murmurs, rubs auscultated Abdomen: BS normoactive,soft,nondistended,non-tender to palpation,no organomegaly Extremities: No pretibial edema, no erythema, no cyanosis, no clubbing Neuro : Alert and responding better to questions. Skin: No rashes seen on exam        Data Reviewed: I have personally reviewed following labs and imaging studies  CBG:  Recent Labs Lab 08/02/17 1120 08/02/17 1651 08/02/17 2118 08/03/17 0746 08/03/17 1129  GLUCAP 88 95 75 73 118*    CBC:  Recent Labs Lab 07/28/17 0330 07/29/17 0500 07/30/17 0531 08/01/17 0500  WBC 8.7 5.5 4.1 4.6  HGB 7.8* 7.3* 8.4* 8.3*  HCT 24.0* 22.5* 25.1* 25.4*  MCV 87.9 87.9 87.8 88.8  PLT 85* 67* 46* 45*    Basic Metabolic  Panel:  Recent Labs Lab 07/28/17 0330 07/29/17 0500 07/30/17 0531 07/31/17 0612 08/01/17 0500 08/02/17 0445  NA 145 146* 146* 145 149*  --   K 2.8* 3.0* 2.7* 2.7* 2.8*  --   CL 105 108 110 111 115*  --   CO2 _0 --   GLUCOSE 101* 92 90 93 79  --   BUN 55* 53* 49* 48* 47*  --   CREATININE 3.68* 3.53* 3.33* 3.25* 3.04* 2.82*  CALCIUM 8.1* 8.1* 8.0* 8.2* 8.3*  --   MG 1.0* 2.0 1.8  --  1.8  --   PHOS 2.3* 3.4 3.7  --   --   --     Recent Results (from the past 240 hour(s))  MRSA PCR Screening     Status: Abnormal   Collection Time: 07/24/17  4:25 PM  Result Value Ref Range Status   MRSA by PCR POSITIVE (A) NEGATIVE Final    Comment:        The GeneXpert MRSA Assay (FDA approved for NASAL specimens only), is one component of a comprehensive MRSA colonization surveillance program. It is not intended to diagnose MRSA infection nor to guide or monitor treatment for MRSA infections. RESULT CALLED TO, READ BACK BY AND VERIFIED WITH: S.ODOM RN AT 2015 ON 07/24/17 BY S.VANHOORNE MLS   C difficile quick scan w PCR reflex     Status: None   Collection  Time: 07/26/17  6:25 PM  Result Value Ref Range Status   C Diff antigen NEGATIVE NEGATIVE Final   C Diff toxin NEGATIVE NEGATIVE Final   C Diff interpretation No C. difficile detected.  Final  Culture, blood (Routine X 2) w Reflex to ID Panel     Status: None   Collection Time: 07/28/17  6:52 PM  Result Value Ref Range Status   Specimen Description BLOOD RIGHT ARM  Final   Special Requests IN PEDIATRIC BOTTLE Blood Culture adequate volume  Final   Culture   Final    NO GROWTH 5 DAYS Performed at [facility], [place] [address] [place], [place] [address]    Report Status 08/03/2017 FINAL  Final  Culture, blood (Routine X 2) w Reflex to ID Panel     Status: None   Collection Time: 07/28/17  6:52 PM  Result Value Ref Range Status   Specimen Description BLOOD RIGHT HAND  Final   Special Requests   Final    BOTTLES DRAWN AEROBIC ONLY Blood Culture adequate volume   Culture   Final    NO GROWTH 5 DAYS Performed at [facility], [place] [address], [place], [place] [address]    Report Status 08/03/2017 FINAL  Final     Liver Function Tests: No results for input(s): AST, ALT, ALKPHOS, BILITOT, PROT, ALBUMIN in the last 168 hours. No results for input(s): LIPASE, AMYLASE in the last 168 hours. No results for input(s): AMMONIA in the last 168 hours.  Cardiac Enzymes: No results for input(s): CKTOTAL, CKMB, CKMBINDEX, TROPONINI in the last 168 hours. BNP (last 3 results)  Recent Labs  07/24/17 1057  BNP 160.4*    ProBNP (last 3 results) No results for input(s): PROBNP in the last 8760 hours.    Studies: No results found.  Scheduled Meds: . chlorhexidine  15 mL Mouth Rinse BID  . Chlorhexidine Gluconate Cloth  6 each Topical Daily  . insulin aspart  0-15 Units Subcutaneous TID WC  . insulin aspart  0-5 Units Subcutaneous QHS  . lidocaine  15 mL Mouth/Throat TID  . mouth rinse  15 mL Mouth Rinse BID  . methimazole  5 mg Oral Daily  . ranitidine  150 mg Oral Daily   . sodium chloride flush  10-40 mL Intracatheter Q12H      Time spent: 25 min  [facility] Pager [phone]. If 7PM-7AM, please contact night-coverage at [email or web address], Office  [phone]  password [email or web address]  08/03/2017, 1:20 PM  LOS: 10 days

## 2017-08-03 NOTE — Progress Notes (Signed)
CS[facility] and they currently do not have any beds available. CSW spoke with patients sister to discuss options hospice home options- daughter stated "the doctor told me he would give us a few days because we really want [place] to be in [place]".   CSW spoke with MD, who would like CSW to follow [facility] in the AM. CSW will continue to follow up.   Stacy GardnerErin Marrah Vanevery, [facility] LPCSWA Emergency Room Clinical Social Worker [phone]

## 2017-08-04 LAB — GLUCOSE, CAPILLARY
GLUCOSE-CAPILLARY: 90 mg/dL (ref 65–99)
Glucose-Capillary: 94 mg/dL (ref 65–99)
Glucose-Capillary: 90 mg/dL (ref 65–99)
Glucose-Capillary: 102 mg/dL — ABNORMAL HIGH (ref 65–99)

## 2017-08-04 NOTE — Clinical Social Work Note (Signed)
CSW contacted [facility] and they still do not have any vacancies. CSW received a call from RN stating pt's daughter requested a list of residential hospice facilities. CSW provided list to RN Dwana CurdVera, as pt's daughter had already left. CSW continuing to follow for discharge needs.   Corlis HoveJeneya Francella Barnett, LCSWA, LCASA Clinical Social Work [facility] Coverage) [phone]

## 2017-08-04 NOTE — Progress Notes (Signed)
[facility]  PROGRESS NOTE  Philomena Buttermore LZJ:[id number] DOB: 1946/08/29 DOA: 07/24/2017 PCP: No primary care provider on file.   Brief HPI:    71 year old female with history of chronic diastolic CHF, CK D stage III, pulmonary embolism on anticoagulation with Xarelto, depression worsened from skilled facility for altered mental status. Patient was hypotensive, found to be in septic shock from Escherichia coli UTI. Patient was admitted to ICU under [facility] M service. Patient was started on pressor support with Levophed.    Subjective   Patient seen and examined, denies any complaints.   Assessment/Plan:     1. Septic shock-secondary to UTI, resolved. Patient is off Levophed. Culture growing ESBL E coli, sensitive to imipenem. PatientWas started on meropenem. Blood cultures growing: Coagulase negative staph in 1 out of 2 bottles. 2. Acute kidney injury-patient space and creatinine is 1.3 as of 04/17/2017, she presented with creatinine of 7.52. Slow improvement Last creatinine was  2.82. 3. Dysphagia- patient has moderate oropharyngeal dysphagia, was seen by speech therapy, patient continues to have delayed swallow across consistencies. Currently she is on dysphagia 1 diet . Palliative care consulted for goals of care. 4. History of pulmonary embolism-patient was on Xarelto at home, which was held at the time of admission. Patient's platelet count is 46,000. Hold Lovenox. Will restart Lovenox once count is greater than 100,000. 5. Oropharyngeal candidiasis- as per speech therapy note, patient was started on Diflucan 100 mg IV daily. Will discontinue Diflucan as patient is currently comfort measures only 6. Acute metabolic encephalopathy - resolved, patient back to baseline. MRI shows no CVA.  EEG is consistent with a generalized nonspecific cerebral dysfunction(encephalopathy). There was no seizure or seizure predisposition recorded on this study.  7.  Thrombocytopenia - likely from  sepsis.  8.  Hyperthyroidism - TSH within normal limits, continue methimazole.  9.  Diabetes mellitus-continue sliding scale insulin with NovoLog. 10. Anemia of chronic disease- hemoglobin is 8.4.  11.  Goals of care discussion-  Palliative care met with patient and her family,  after discussion family opted for comfort measures only. Social work consulted for residential hospice. Will not repeat labs in am. Antibiotics have been discontinued. Diflucan discontinued    DVT prophylaxis: Start on Lovenox  Code Status: DO NOT RESUSCITATE  Family Communication: No family at bedside   Disposition Plan: I called and discussed with patient's sister on phone, awaiting [facility] bed, if no bed available by Monday, we'll look for bed at [facility] place or [facility].   Consultants:  [facility] M  Procedures: STUDIES:  CT head 8/29 > Possible moderate acute infarct in the lateral left frontal lobe. Certainty is diminished by artifact from patient positioning. Chronic microvascular ischemia. Chronic left maxillary sinusitis. CT abd 8/29 > No acute intra-abdominal process. Large stool ball in the rectum. Correlate for fecal impaction. Small amount of patchy centrilobular nodularity in the left lower lobe, which could reflect aspiration. 8/31 renal us>>  CULTURES: Blood 8/29 >staph mrsa>> Urine 8/30 >  ANTIBIOTICS: Zosyn 8/29 > Vanco 8/30 >  SIGNIFICANT EVENTS: 8/29 admit 8/30 tx to ICU for pressors.   LINES/TUBES: RIJ CVL 8/30 >  Continuous infusions . sodium chloride    . dextrose 5 % and 0.45% NaCl 50 mL/hr at 08/03/17 0631      Antibiotics:   Anti-infectives    Start     Dose/Rate Route Frequency Ordered Stop   07/31/17 1800  fluconazole (DIFLUCAN) IVPB 100 mg  Status:  Discontinued  100 mg 50 mL/hr over 60 Minutes Intravenous Every 24 hours 07/31/17 1612 08/02/17 0857   07/30/17 0800  vancomycin (VANCOCIN) IVPB 1000 mg/200 mL premix     1,000 mg 200 mL/hr  over 60 Minutes Intravenous  Once 07/30/17 0645 07/30/17 0904   07/27/17 1430  meropenem (MERREM) 1 g in sodium chloride 0.9 % 100 mL IVPB  Status:  Discontinued     1 g 200 mL/hr over 30 Minutes Intravenous Every 12 hours 07/27/17 1410 08/02/17 0857   07/27/17 0700  vancomycin (VANCOCIN) IVPB 1000 mg/200 mL premix     1,000 mg 200 mL/hr over 60 Minutes Intravenous  Once 07/27/17 0647 07/27/17 0830   07/26/17 1100  piperacillin-tazobactam (ZOSYN) IVPB 2.25 g  Status:  Discontinued     2.25 g 100 mL/hr over 30 Minutes Intravenous Every 6 hours 07/26/17 0931 07/27/17 1409   07/25/17 0545  vancomycin (VANCOCIN) IVPB 1000 mg/200 mL premix     1,000 mg 200 mL/hr over 60 Minutes Intravenous  Once 07/25/17 0540 07/25/17 0740   07/24/17 2157  piperacillin-tazobactam (ZOSYN) IVPB 2.25 g  Status:  Discontinued     2.25 g 100 mL/hr over 30 Minutes Intravenous Every 8 hours 07/24/17 1504 07/26/17 0931   07/24/17 1330  piperacillin-tazobactam (ZOSYN) IVPB 2.25 g  Status:  Discontinued     2.25 g 100 mL/hr over 30 Minutes Intravenous Every 6 hours 07/24/17 1317 07/24/17 1504       Objective   Vitals:   08/03/17 1350 08/03/17 2007 08/04/17 0504 08/04/17 1304  BP: 94/62 111/62 102/60 (!) 94/46  Pulse: 70 60 60 61  Resp: '18 19 18 18  ' Temp:  (!) 97.5 F (36.4 C) (!) 97.4 F (36.3 C) 97.6 F (36.4 C)  TempSrc: Oral Oral Oral Oral  SpO2: 100% 96% 98% 100%  Weight:      Height:        Intake/Output Summary (Last 24 hours) at 08/04/17 1403 Last data filed at 08/04/17 1300  Gross per 24 hour  Intake              920 ml  Output              475 ml  Net              445 ml   Filed Weights   07/24/17 1043  Weight: 84.8 kg (187 lb)     Physical Examination:   Physical Exam: Eyes: No icterus, extraocular muscles intact  Mouth: Oral mucosa is moist, no lesions on palate,  Neck: Supple, no deformities, masses, or tenderness Lungs: Normal respiratory effort, bilateral clear to  auscultation, no crackles or wheezes.  Heart: Regular rate and rhythm, S1 and S2 normal, no murmurs, rubs auscultated Abdomen: BS normoactive,soft,nondistended,non-tender to palpation,no organomegaly Extremities: No pretibial edema, no erythema, no cyanosis, no clubbing Neuro : Alert and oriented to time, place and person, No focal deficits Skin: No rashes seen on exam        Data Reviewed: I have personally reviewed following labs and imaging studies  CBG:  Recent Labs Lab 08/03/17 1129 08/03/17 1632 08/03/17 2003 08/04/17 0719 08/04/17 1216  GLUCAP 118* 109* 99 94 90    CBC:  Recent Labs Lab 07/29/17 0500 07/30/17 0531 08/01/17 0500  WBC 5.5 4.1 4.6  HGB 7.3* 8.4* 8.3*  HCT 22.5* 25.1* 25.4*  MCV 87.9 87.8 88.8  PLT 67* 46* 45*    Basic Metabolic Panel:  Recent Labs Lab 07/29/17 0500  07/30/17 0531 07/31/17 0612 08/01/17 0500 08/02/17 0445  NA 146* 146* 145 149*  --   K 3.0* 2.7* 2.7* 2.8*  --   CL 108 110 111 115*  --   CO2 '29 27 24 26  ' --   GLUCOSE 92 90 93 79  --   BUN 53* 49* 48* 47*  --   CREATININE 3.53* 3.33* 3.25* 3.04* 2.82*  CALCIUM 8.1* 8.0* 8.2* 8.3*  --   MG 2.0 1.8  --  1.8  --   PHOS 3.4 3.7  --   --   --     Recent Results (from the past 240 hour(s))  C difficile quick scan w PCR reflex     Status: None   Collection Time: 07/26/17  6:25 PM  Result Value Ref Range Status   C Diff antigen NEGATIVE NEGATIVE Final   C Diff toxin NEGATIVE NEGATIVE Final   C Diff interpretation No C. difficile detected.  Final  Culture, blood (Routine X 2) w Reflex to ID Panel     Status: None   Collection Time: 07/28/17  6:52 PM  Result Value Ref Range Status   Specimen Description BLOOD RIGHT ARM  Final   Special Requests IN PEDIATRIC BOTTLE Blood Culture adequate volume  Final   Culture   Final    NO GROWTH 5 DAYS Performed at [facility], [place] [address], [place], [place] [address]    Report Status 08/03/2017 FINAL  Final  Culture,  blood (Routine X 2) w Reflex to ID Panel     Status: None   Collection Time: 07/28/17  6:52 PM  Result Value Ref Range Status   Specimen Description BLOOD RIGHT HAND  Final   Special Requests   Final    BOTTLES DRAWN AEROBIC ONLY Blood Culture adequate volume   Culture   Final    NO GROWTH 5 DAYS Performed at [facility], [place] [address], [place], [place] [address]    Report Status 08/03/2017 FINAL  Final     Liver Function Tests: No results for input(s): AST, ALT, ALKPHOS, BILITOT, PROT, ALBUMIN in the last 168 hours. No results for input(s): LIPASE, AMYLASE in the last 168 hours. No results for input(s): AMMONIA in the last 168 hours.  Cardiac Enzymes: No results for input(s): CKTOTAL, CKMB, CKMBINDEX, TROPONINI in the last 168 hours. BNP (last 3 results)  Recent Labs  07/24/17 1057  BNP 160.4*    ProBNP (last 3 results) No results for input(s): PROBNP in the last 8760 hours.    Studies: No results found.  Scheduled Meds: . chlorhexidine  15 mL Mouth Rinse BID  . Chlorhexidine Gluconate Cloth  6 each Topical Daily  . insulin aspart  0-15 Units Subcutaneous TID WC  . insulin aspart  0-5 Units Subcutaneous QHS  . lidocaine  15 mL Mouth/Throat TID  . mouth rinse  15 mL Mouth Rinse BID  . methimazole  5 mg Oral Daily  . ranitidine  150 mg Oral Daily  . sodium chloride flush  10-40 mL Intracatheter Q12H      Time spent: 25 min  [facility] Pager [phone]. If 7PM-7AM, please contact night-coverage at [email or web address], Office  [phone]  password [email or web address]  08/04/2017, 2:03 PM  LOS: 11 days

## 2017-08-05 LAB — GLUCOSE, CAPILLARY
GLUCOSE-CAPILLARY: 103 mg/dL — AB (ref 65–99)
Glucose-Capillary: 107 mg/dL — ABNORMAL HIGH (ref 65–99)

## 2017-08-05 MED ORDER — ONDANSETRON HCL 4 MG/2ML IJ SOLN
4.0000 mg | Freq: Four times a day (QID) | INTRAMUSCULAR | Status: DC | PRN
  Administered 2017-08-05: 4 mg via INTRAVENOUS
  Filled 2017-08-05: qty 2

## 2017-08-05 MED ORDER — HYDROCODONE-ACETAMINOPHEN 5-325 MG PO TABS: 1.0000 | ORAL_TABLET | ORAL | 0 refills | Status: AC | PRN

## 2017-08-05 NOTE — Discharge Summary (Signed)
Physician Discharge Summary  Katie Evans IOE:[id number] DOB: November 14, 1946 DOA: 07/24/2017  PCP: No primary care provider on file.  Admit date: 07/24/2017 Discharge date: 08/05/2017  Time spent: 40* minutes  Recommendations for Outpatient Follow-up:  1. Patient to be discharged to residential hospice   Discharge Diagnoses:  Active Problems:   Diastolic CHF, acute on chronic (HCC)   Pulmonary hypertension (HCC)   Essential hypertension   Protein-calorie malnutrition (HCC)   Chronic pulmonary embolism (HCC)   Anemia of chronic disease   Long term current use of anticoagulant therapy   AKI (acute kidney injury) ([place])   Obstipation   Acute metabolic encephalopathy   Hypotension   Hypernatremia   Hyperkalemia   Pressure injury of skin   Septic shock (HCC)   Protein-calorie malnutrition, severe   Encounter for palliative care   Goals of care, counseling/discussion   Discharge Condition: Stable  Diet recommendation: Comfort diet  Filed Weights   07/24/17 1043  Weight: 84.8 kg (187 lb)    History of present illness:  71 year old female with history of chronic diastolic CHF, CK D stage III, pulmonary embolism on anticoagulation with Xarelto, depression worsened from skilled facility for altered mental status. Patient was hypotensive, found to be in septic shock from Escherichia coli UTI. Patient was admitted to ICU under [facility] service. Patient was started on pressor support with Levophed.   Hospital Course:  1. Septic shock-secondary to UTI, resolved. Patient is off Levophed. Culture growing ESBL E coli, sensitive to imipenem. PatientWas started on meropenem. Blood cultures growing: Coagulase negative staph in 1 out of 2 bottles. 2. Acute kidney injury-patient Baseline  creatinine is 1.3 as of 04/17/2017, she presented with creatinine of 7.52. Slow improvement Last creatinine was  2.82. 3. Dysphagia- patient has moderate oropharyngeal dysphagia, was seen by speech therapy,  patient continues to have delayed swallow across consistencies. Currently she is on dysphagia 1 diet . Palliative care consulted for goals of care. 4. History of pulmonary embolism-patient was on Xarelto at home, which was held at the time of admission. Patient's platelet count is 46,000. Will discontinue Xarelto at this time as patient is going to residential hospice.goal is comfort measures only  5. Oropharyngeal candidiasis- as per speech therapy note, patient was started on Diflucan 100 mg IV daily. Will discontinue Diflucan as patient is currently comfort measures only 6. Acute metabolic encephalopathy - resolved, patient back to baseline. MRI shows no CVA. EEG is consistent with a generalized nonspecific cerebral dysfunction(encephalopathy). There was no seizure or seizure predisposition recorded on this study.  7.  Thrombocytopenia - likely from sepsis.  8.  Hyperthyroidism - TSH within normal limits, continue methimazole.  9.  Diabetes mellitus-continue sliding scale insulin with NovoLog. 10. Anemia of chronic disease- hemoglobin is 8.4.  11.  Goals of care discussion-  Palliative care met with patient and her family,  after discussion family opted for comfort measures only. Social work consulted for residential hospice. No further labs were obtained Antibiotics have been discontinued. Diflucan discontinued  Procedures:  None   Consultations:  Palliative care  Discharge Exam: Vitals:   08/04/17 2059 08/05/17 0436  BP: 104/79 112/77  Pulse: 69 67  Resp: 17 18  Temp: (!) 97.4 F (36.3 C) (!) 96.7 F (35.9 C)  SpO2: 99% 98%    General: Appears in no acute distress Cardiovascular: S1S2, RRR Respiratory: Clear bilaterally  Discharge Instructions   Discharge Instructions    Diet - low sodium heart healthy    Complete by:  As directed    Increase activity slowly    Complete by:  As directed      Current Discharge Medication List    CONTINUE these medications which have  CHANGED   Details  HYDROcodone-acetaminophen (NORCO/VICODIN) 5-325 MG tablet Take 1-2 tablets by mouth every 4 (four) hours as needed for moderate pain. Qty: 10 tablet, Refills: 0      CONTINUE these medications which have NOT CHANGED   Details  benzocaine (ORAL ANALGESIC MAX ST) 20 % GEL Use as directed 1 application in the mouth or throat 3 (three) times daily.    Cholecalciferol (D3-1000 PO) Take 1,000 Units by mouth daily.     cyclobenzaprine (FLEXERIL) 5 MG tablet Take 5 mg by mouth 3 (three) times daily as needed for muscle spasms.    famotidine (PEPCID) 20 MG tablet Take 20 mg by mouth daily.     Liniments (SALONPAS EX) Apply 1 patch topically daily as needed (Pain).    loperamide (IMODIUM) 2 MG capsule Take 2 mg by mouth 4 (four) times daily as needed for diarrhea or loose stools.     Menthol, Topical Analgesic, (ICY HOT EX) Apply 1 application topically. Apply 7.5% cream to both shoulders at 9AM, 3PM, 9PM.  Hold for skin irritation.    methimazole (TAPAZOLE) 5 MG tablet Take 1 tablet (5 mg total) by mouth daily. Qty: 30 tablet, Refills: 5    nadolol (CORGARD) 40 MG tablet Take 40 mg by mouth daily.     nitroGLYCERIN (NITROSTAT) 0.4 MG SL tablet Place 0.4 mg under the tongue every 5 (five) minutes as needed for chest pain. For 3 doses as needed for chest pain    oxybutynin (DITROPAN) 5 MG tablet Take 5 mg by mouth daily.     pramipexole (MIRAPEX) 1.5 MG tablet Take 1.5 mg by mouth 2 (two) times daily.     sertraline (ZOLOFT) 100 MG tablet Take 200 mg by mouth daily. Take two 100 mg tablets to = 200 mg po QD for depression    shark liver oil-cocoa butter (PREPARATION H) 0.25-3-85.5 % suppository Place 1 suppository rectally 2 (two) times daily as needed for hemorrhoids.    topiramate (TOPAMAX) 50 MG tablet Take 50 mg by mouth 2 (two) times daily.       STOP taking these medications     aspirin EC 81 MG tablet      atorvastatin (LIPITOR) 20 MG tablet       ciprofloxacin (CIPRO) 250 MG tablet      OVER THE COUNTER MEDICATION      potassium chloride SA (K-DUR,KLOR-CON) 20 MEQ tablet      PRESCRIPTION MEDICATION      rivaroxaban (XARELTO) 10 MG TABS tablet      torsemide (DEMADEX) 20 MG tablet        Allergies  Allergen Reactions  . Remeron [Mirtazapine]     Per MAR. No rxn listed      The results of significant diagnostics from this hospitalization (including imaging, microbiology, ancillary and laboratory) are listed below for reference.    Significant Diagnostic Studies: Ct Abdomen Pelvis Wo Contrast  Result Date: 07/24/2017 CLINICAL DATA:  Abdominal pain. EXAM: CT ABDOMEN AND PELVIS WITHOUT CONTRAST TECHNIQUE: Multidetector CT imaging of the abdomen and pelvis was performed following the standard protocol without IV contrast. COMPARISON:  None. FINDINGS: Lower chest: Subsegmental atelectasis in the left lower lobe. Patchy centrilobular nodules in the left lower lobe. Heart is normal in size. Hepatobiliary: No focal liver  abnormality. Layering stones or sludge in the gallbladder. No biliary dilatation. Pancreas: Pancreatic atrophy.  No ductal dilatation. Spleen: Normal in size without focal abnormality. Adrenals/Urinary Tract: The adrenal glands are unremarkable. No renal calculi or hydronephrosis. The bladder is decompressed by a Foley catheter. 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 No bowel wall thickening, distention, or surrounding inflammatory changes. Large stool ball in the rectum. Scattered left-sided colonic diverticulosis. Vascular/Lymphatic: Aortic atherosclerosis. No enlarged abdominal or pelvic lymph nodes. Reproductive: Status post hysterectomy. No adnexal masses. Other: Small fat containing umbilical hernia.  No free fluid. Musculoskeletal: No acute or significant osseous findings. Severe multilevel degenerative changes of the lumbar spine with stepwise grade 1 retrolisthesis  at L1-L2 and L2-L3. IMPRESSION: 1. No acute intra-abdominal process. 2. Large stool ball in the rectum.  Correlate for fecal impaction. 3. Small amount of patchy centrilobular nodularity in the left lower lobe, which could reflect aspiration. 4.  Aortic atherosclerosis (ICD10-I70.0). Electronically Signed   By: Titus Dubin M.D.   On: 07/24/2017 13:16   Ct Head Wo Contrast  Result Date: 07/24/2017 CLINICAL DATA:  Altered mental status for a month.  Nonverbal.  Hip EXAM: CT HEAD WITHOUT CONTRAST TECHNIQUE: Contiguous axial images were obtained from the base of the skull through the vertex without intravenous contrast. COMPARISON:  11/25/2014 FINDINGS: Brain: Moderate area of poor gray-white differentiation in the anterior and lateral left frontal lobe. The subjacent white matter is also more low density than the right. Certainty of this finding is diminished by asymmetric patient positioning and streak artifact. No hemorrhage, hydrocephalus, or masslike findings. There is periventricular chronic microvascular ischemic change that is mild. Mild cerebral volume loss. Vascular: No hyperdense vessel.  Atherosclerotic calcifications. Skull: Negative Sinuses/Orbits: Chronic left maxillary sinusitis with near complete opacification of the visualized sinus, with sclerotic wall thickening. Same findings were seen on 2015 comparison. Other: Case discussed with Dr. Wilson Singer. IMPRESSION: 1. Possible moderate acute infarct in the lateral left frontal lobe. Certainty is diminished by artifact from patient positioning. 2. Chronic microvascular ischemia. 3. Chronic left maxillary sinusitis. Electronically Signed   By: Monte Fantasia M.D.   On: 07/24/2017 11:48   Mr Brain Wo Contrast  Result Date: 07/26/2017 CLINICAL DATA:  71 y/o F; altered mental status, possible encephalopathy. Abnormal CT of head. EXAM: MRI HEAD WITHOUT CONTRAST TECHNIQUE: Multiplanar, multiecho pulse sequences of the brain and surrounding structures  were obtained without intravenous contrast. COMPARISON:  07/24/2017 CT head FINDINGS: Brain: No acute infarction, hemorrhage, hydrocephalus, extra-axial collection or mass lesion. Several nonspecific foci of T2 FLAIR hyperintense signal abnormality in subcortical and periventricular white matter and patchy signal within the pons is compatible with moderate chronic microvascular ischemic changes for age. Moderate brain parenchymal volume loss. Vascular: Normal flow voids. Skull and upper cervical spine: Normal marrow signal. Sinuses/Orbits: Left maxillary sinus opacification with chronic inflammatory changes of the wall of the sinus. Small left frontal sinus mucous retention cyst. No abnormal signal of mastoid air cells. Orbits are unremarkable. Other: None. IMPRESSION: 1. No acute intracranial abnormality identified. Hypodensity on CT does not have a corresponding signal abnormality on MRI and is likely artifactual. 2. Moderate chronic microvascular ischemic changes and moderate parenchymal volume loss of the brain. 3. Left maxillary sinus opacification and chronic inflammatory changes. Electronically Signed   By: Kristine Garbe M.D.   On: 07/26/2017 22:37   US Renal  Result Date: 07/26/2017 CLINICAL DATA:  Renal failure. EXAM: RENAL / URINARY TRACT ULTRASOUND COMPLETE COMPARISON:  CT 07/24/2017 . FINDINGS: Right Kidney: Length: 8.9 cm. Echogenicity within normal limits. No mass or hydronephrosis visualized. Left Kidney: Length: 8.7 cm. Echogenicity within normal limits. No mass or hydronephrosis visualized. Bladder: Appears normal for degree of bladder distention. Foley catheter in the bladder. IMPRESSION: No acute or focal abnormality identified. Electronically Signed   By: Marcello Moores  Register   On: 07/26/2017 15:08   Dg Chest Port 1 View  Result Date: 07/28/2017 CLINICAL DATA:  Acute respiratory failure. EXAM: PORTABLE CHEST 1 VIEW COMPARISON:  One-view chest x-ray 07/27/2017 FINDINGS: The heart  size is exaggerated by low lung volumes. A right IJ line is stable. Left lower lobe pneumonia is slightly improved. Mild right basilar airspace disease is stable. Left shoulder replacement is noted. IMPRESSION: 1. Slightly improved left lower lobe pneumonia. 2. Stable right IJ line. Electronically Signed   By: San Morelle M.D.   On: 07/28/2017 07:15   Dg Chest Port 1 View  Result Date: 07/27/2017 CLINICAL DATA:  Respiratory failure. EXAM: PORTABLE CHEST 1 VIEW COMPARISON:  One-view chest x-ray 07/26/2017. FINDINGS: Heart size is exaggerated by low lung volumes. A right IJ line is stable. Left lower lobe airspace disease is again noted. The right lung remains clear. A small left pleural effusion is noted. IMPRESSION: 1. Stable appearance of left lower lobar pneumonia. 2. Small left pleural effusion. Electronically Signed   By: San Morelle M.D.   On: 07/27/2017 07:27   Dg Chest Port 1 View  Result Date: 07/26/2017 CLINICAL DATA:  Respiratory failure . EXAM: PORTABLE CHEST 1 VIEW COMPARISON:  07/25/2017 . FINDINGS: Right IJ line noted in stable position. Heart size normal. Persistent left mid lung and left base infiltrate consistent pneumonia. Small left pleural effusion. No pneumothorax. Left shoulder replacement. IMPRESSION: 1. Right IJ line in stable position. 2. Persistent left mid lung field and left base infiltrate consistent with pneumonia . Electronically Signed   By: Marcello Moores  Register   On: 07/26/2017 07:03   Dg Chest Port 1 View  Result Date: 07/25/2017 CLINICAL DATA:  Dyspnea.  Central line placement. EXAM: PORTABLE CHEST 1 VIEW COMPARISON:  07/24/2017 FINDINGS: There is a new right jugular central line, terminating in the low SVC. There is no pneumothorax. Left central and basilar lung consolidation, new. Right lung is clear. Pulmonary vasculature is normal. IMPRESSION: 1. Satisfactorily positioned right jugular central line. No pneumothorax. 2. New left base consolidation.  This  may represent pneumonia. Electronically Signed   By: Andreas Newport M.D.   On: 07/25/2017 05:49   Dg Chest Portable 1 View  Result Date: 07/24/2017 CLINICAL DATA:  Hypoxemia.  Altered mental status. EXAM: PORTABLE CHEST 1 VIEW COMPARISON:  05/10/2015 FINDINGS: The heart size and mediastinal contours are within normal limits. Both lungs are clear. Left shoulder prosthesis again noted. IMPRESSION: No active disease. Electronically Signed   By: Earle Gell M.D.   On: 07/24/2017 11:27   Dg Knee Complete 4 Views Right  Result Date: 07/24/2017 CLINICAL DATA:  Right knee pain.  Altered mental status. EXAM: RIGHT KNEE - COMPLETE 4+ VIEW COMPARISON:  None. FINDINGS: Right knee prosthesis in expected position. No evidence of hardware failure or loosening. Generalized osteopenia noted. No evidence of fracture or dislocation. No evidence of knee joint effusion. IMPRESSION: No acute findings. Electronically Signed   By: Earle Gell M.D.   On: 07/24/2017 11:29   Dg Swallowing Func-speech Pathology  Result Date: 07/29/2017 Objective Swallowing Evaluation: Type of Study: MBS-Modified Barium Swallow Study Patient Details Name: Katie  Evans MRN: [id number] Date of Birth: March 08, 1946 Today's Date: 07/29/2017 Time: SLP Start Time (ACUTE ONLY): 1425-SLP Stop Time (ACUTE ONLY): 1459 SLP Time Calculation (min) (ACUTE ONLY): 34 min Past Medical History: Past Medical History: Diagnosis Date . Allergic rhinitis  . Anemia  . Anxiety  . Arthritis  . Bruising  . Carpal tunnel syndrome  . CHF (congestive heart failure) ([place])  . Chronic diarrhea  . Chronic pulmonary embolism ([place])  . Demand ischemia of myocardium ([place])  . Flatulence  . Generalized anxiety disorder  . GERD without esophagitis  . Hemorrhoids  . Hyperlipidemia  . Hypertension  . Hyperthyroidism  . Hypokalemia  . Long term current use of anticoagulant therapy  . Major depressive disorder  . Metabolic encephalopathy  . Overactive bladder  . Protein calorie malnutrition  ([place])  . Psychosis  . Renal disorder  . RLS (restless legs syndrome)  . Thrombocytopathia ([place])  . Thrombocytopenia ([place])  . Vaginal atrophy  Past Surgical History: Past Surgical History: Procedure Laterality Date . ABDOMINAL HYSTERECTOMY   . APPENDECTOMY   . BACK SURGERY   . GASTRIC BYPASS   . SHOULDER SURGERY   . SPINE SURGERY    spinal fusion . TONSILLECTOMY   HPI: Lovette Merta a 71 y.o.femalewith medical history significant of past medical history of chronic renal disease stage III, chronic heart failure, ? parkinsonism, chronic pulmonary embolism on Xarelto, major depressive disorder is sent here from [facility] for altered mental status. Found to have septic shock secondary to  UTI, acute renal failure, hyponatremia, metabolic acidosis and GPC bacteremia. CXR stable appearance of left lower lobar pneumonia, small left pleural effusion. Pt has been seen for swallowing evaluation and follow up indicated to determine readiness for po.  Pt has been NPO since admission and per RN, pt was not eating well prior to admission.  Subjective: pt awake in chair Assessment / Plan / Recommendation CHL IP CLINICAL IMPRESSIONS 07/29/2017 Clinical Impression Pt presents with moderate oropharyngeal dysphagia with sensorimotor deficits.   Significant weakness results in oral transiting delay, decreased propulsion, premature spillage and oral residuals.  Oral residuals inconsistently were swallowed despite cues.  Patient benefited from use of dry spoon and/or minimal amount of liquid on spoon to help elicit swallow.  Secretions retained in oropharynx mixed with barium - ? if contributed to gag occuring approximately four times during session.  Pharyngeal swallow marginally weak resulting in worse residuals with puree vs liquids without pt awareness.  Trace aspiration x1 with thin via cup noted that elicited weak cough response.  Pt will be a nutrition and aspiration risk due to her dysphagia - however also suspect she will  not tolerate feeding tube due to excessive gag.  Recommend nectar liquids via TSP only with strict precautions.  SLP to follow up for readiness for dietary advancement and family/pt family education.    SLP Visit Diagnosis Dysphagia, oropharyngeal phase (R13.12) Attention and concentration deficit following -- Frontal lobe and executive function deficit following -- Impact on safety and function Moderate aspiration risk;Severe aspiration risk   CHL IP TREATMENT RECOMMENDATION 07/29/2017 Treatment Recommendations Therapy as outlined in treatment plan below   Prognosis 07/29/2017 Prognosis for Safe Diet Advancement Guarded Barriers to Reach Goals Time post onset;Severity of deficits;Cognitive deficits Barriers/Prognosis Comment -- CHL IP DIET RECOMMENDATION 07/29/2017 SLP Diet Recommendations Nectar thick liquid Liquid Administration via Spoon Medication Administration Crushed with puree Compensations Slow rate;Small sips/bites Postural Changes Seated upright at 90 degrees;Remain semi-upright after after feeds/meals (Comment)   CHL  IP OTHER RECOMMENDATIONS 07/29/2017 Recommended Consults -- Oral Care Recommendations -- Other Recommendations Have oral suction available;Order thickener from pharmacy   CHL IP FOLLOW UP RECOMMENDATIONS 07/29/2017 Follow up Recommendations Skilled Nursing facility   [facility] IP FREQUENCY AND DURATION 07/29/2017 Speech Therapy Frequency (ACUTE ONLY) min 1 x/week Treatment Duration 1 week      CHL IP ORAL PHASE 07/29/2017 Oral Phase Impaired Oral - Pudding Teaspoon -- Oral - Pudding Cup -- Oral - Honey Teaspoon -- Oral - Honey Cup -- Oral - Nectar Teaspoon Impaired mastication;Reduced posterior propulsion;Decreased bolus cohesion;Delayed oral transit;Weak lingual manipulation Oral - Nectar Cup Weak lingual manipulation;Reduced posterior propulsion;Decreased bolus cohesion;Premature spillage;Delayed oral transit;Lingual/palatal residue Oral - Nectar Straw Weak lingual manipulation Oral - Thin Teaspoon Weak  lingual manipulation;Delayed oral transit;Decreased bolus cohesion;Lingual pumping;Reduced posterior propulsion;Lingual/palatal residue Oral - Thin Cup Weak lingual manipulation;Reduced posterior propulsion;Delayed oral transit;Decreased bolus cohesion;Premature spillage;Lingual/palatal residue Oral - Thin Straw -- Oral - Puree Weak lingual manipulation;Reduced posterior propulsion;Delayed oral transit;Decreased bolus cohesion;Premature spillage;Lingual/palatal residue Oral - Mech Soft -- Oral - Regular -- Oral - Multi-Consistency -- Oral - Pill -- Oral Phase - Comment --  CHL IP PHARYNGEAL PHASE 07/29/2017 Pharyngeal Phase Impaired Pharyngeal- Pudding Teaspoon -- Pharyngeal -- Pharyngeal- Pudding Cup -- Pharyngeal -- Pharyngeal- Honey Teaspoon -- Pharyngeal -- Pharyngeal- Honey Cup -- Pharyngeal -- Pharyngeal- Nectar Teaspoon Delayed swallow initiation-vallecula;Delayed swallow initiation-pyriform sinuses;Pharyngeal residue - valleculae Pharyngeal -- Pharyngeal- Nectar Cup Delayed swallow initiation-vallecula;Pharyngeal residue - valleculae Pharyngeal -- Pharyngeal- Nectar Straw NT Pharyngeal -- Pharyngeal- Thin Teaspoon Delayed swallow initiation-vallecula;Delayed swallow initiation-pyriform sinuses;Pharyngeal residue - valleculae Pharyngeal -- Pharyngeal- Thin Cup Pharyngeal residue - valleculae;Delayed swallow initiation-pyriform sinuses;Trace aspiration;Penetration/Aspiration during swallow Pharyngeal Material enters airway, passes BELOW cords and not ejected out despite cough attempt by patient Pharyngeal- Thin Straw -- Pharyngeal -- Pharyngeal- Puree Delayed swallow initiation-vallecula;Reduced tongue base retraction;Pharyngeal residue - valleculae Pharyngeal -- Pharyngeal- Mechanical Soft -- Pharyngeal -- Pharyngeal- Regular -- Pharyngeal -- Pharyngeal- Multi-consistency -- Pharyngeal -- Pharyngeal- Pill -- Pharyngeal -- Pharyngeal Comment barium mixed with secretions retained in pharynx without pt awareness   CHL IP CERVICAL ESOPHAGEAL PHASE 07/29/2017 Cervical Esophageal Phase Impaired Pudding Teaspoon -- Pudding Cup -- Honey Teaspoon -- Honey Cup -- Nectar Teaspoon -- Nectar Cup -- Nectar Straw -- Thin Teaspoon -- Thin Cup -- Thin Straw -- Puree -- Mechanical Soft -- Regular -- Multi-consistency -- Pill -- Cervical Esophageal Comment appearance of minimal residuals at distal esopahgus without pt awareness, radiologist not present to confirm Luanna Salk, [place] [facility] SLP [phone] No flowsheet data found. Macario Golds 07/29/2017, 3:38 PM               Microbiology: Recent Results (from the past 240 hour(s))  C difficile quick scan w PCR reflex     Status: None   Collection Time: 07/26/17  6:25 PM  Result Value Ref Range Status   C Diff antigen NEGATIVE NEGATIVE Final   C Diff toxin NEGATIVE NEGATIVE Final   C Diff interpretation No C. difficile detected.  Final  Culture, blood (Routine X 2) w Reflex to ID Panel     Status: None   Collection Time: 07/28/17  6:52 PM  Result Value Ref Range Status   Specimen Description BLOOD RIGHT ARM  Final   Special Requests IN PEDIATRIC BOTTLE Blood Culture adequate volume  Final   Culture   Final    NO GROWTH 5 DAYS Performed at [facility], [place] [address], [place],  [address]  Report Status 08/03/2017 FINAL  Final  Culture, blood (Routine X 2) w Reflex to ID Panel     Status: None   Collection Time: 07/28/17  6:52 PM  Result Value Ref Range Status   Specimen Description BLOOD RIGHT HAND  Final   Special Requests   Final    BOTTLES DRAWN AEROBIC ONLY Blood Culture adequate volume   Culture   Final    NO GROWTH 5 DAYS Performed at [facility], [place] [address] [place], [place] [address]    Report Status 08/03/2017 FINAL  Final     Labs: Basic Metabolic Panel:  Recent Labs Lab 07/30/17 0531 07/31/17 0612 08/01/17 0500 08/02/17 0445  NA 146* 145 149*  --   K 2.7* 2.7* 2.8*  --   CL 110 111 115*  --   CO2 '27 24 26  ' --    GLUCOSE 90 93 79  --   BUN 49* 48* 47*  --   CREATININE 3.33* 3.25* 3.04* 2.82*  CALCIUM 8.0* 8.2* 8.3*  --   MG 1.8  --  1.8  --   PHOS 3.7  --   --   --    Liver Function Tests: No results for input(s): AST, ALT, ALKPHOS, BILITOT, PROT, ALBUMIN in the last 168 hours. No results for input(s): LIPASE, AMYLASE in the last 168 hours. No results for input(s): AMMONIA in the last 168 hours. CBC:  Recent Labs Lab 07/30/17 0531 08/01/17 0500  WBC 4.1 4.6  HGB 8.4* 8.3*  HCT 25.1* 25.4*  MCV 87.8 88.8  PLT 46* 45*   Cardiac Enzymes: No results for input(s): CKTOTAL, CKMB, CKMBINDEX, TROPONINI in the last 168 hours. BNP: BNP (last 3 results)  Recent Labs  07/24/17 1057  BNP 160.4*    ProBNP (last 3 results) No results for input(s): PROBNP in the last 8760 hours.  CBG:  Recent Labs Lab 08/04/17 0719 08/04/17 1216 08/04/17 1627 08/04/17 2103 08/05/17 0719  GLUCAP 94 90 102* 90 107*       Signed:  Siria Calandro S MD.  [facility] 08/05/2017, 11:10 AM

## 2017-08-05 NOTE — Progress Notes (Signed)
[facility]. The patient can transport today after 3:00pm. CSW informed patient sister Annabelle HarmanDana, she plans to fillout admission paperwork this afternoon. DNR signed, MED. Nec. Form completed. PTAR scheduled for 3:00pm pick up.     Vivi BarrackNicole Melford Tullier, Theresia MajorsLCSWA, MSW Clinical Social Worker 5E and Psychiatric Service Line [phone] 08/05/2017  9:19 AM

## 2017-08-05 NOTE — Progress Notes (Signed)
NURSING PROGRESS NOTE  Katie Evans [id number] Discharge Data: 08/05/2017 12:14 PM Attending Provider: Meredeth Ide, MD PCP:No primary care provider on file.     Katie Evans to be D/C'd [facility] per MD order.All IV's discontinued with no bleeding noted. Reported called to Zella Ball at [facility].  Last Vital Signs:  Blood pressure 112/77, pulse 67, temperature (!) 96.7 F (35.9 C), temperature source Axillary, resp. rate 18, height  (1.626 m), weight 84.8 kg (187 lb), SpO2 98 %.  Discharge Medication List Allergies as of 08/05/2017      Reactions   Remeron [mirtazapine]    Per MAR. No rxn listed      Medication List    STOP taking these medications   aspirin EC 81 MG tablet   atorvastatin 20 MG tablet Commonly known as:  LIPITOR   ciprofloxacin 250 MG tablet Commonly known as:  CIPRO   OVER THE COUNTER MEDICATION   potassium chloride SA 20 MEQ tablet Commonly known as:  K-DUR,KLOR-CON   PRESCRIPTION MEDICATION   rivaroxaban 10 MG Tabs tablet Commonly known as:  XARELTO   torsemide 20 MG tablet Commonly known as:  DEMADEX     TAKE these medications   cyclobenzaprine 5 MG tablet Commonly known as:  FLEXERIL Take 5 mg by mouth 3 (three) times daily as needed for muscle spasms.   D3-1000 PO Take 1,000 Units by mouth daily.   famotidine 20 MG tablet Commonly known as:  PEPCID Take 20 mg by mouth daily.   HYDROcodone-acetaminophen 5-325 MG tablet Commonly known as:  NORCO/VICODIN Take 1-2 tablets by mouth every 4 (four) hours as needed for moderate pain.   ICY HOT EX Apply 1 application topically. Apply 7.5% cream to both shoulders at 9AM, 3PM, 9PM.  Hold for skin irritation.   loperamide 2 MG capsule Commonly known as:  IMODIUM Take 2 mg by mouth 4 (four) times daily as needed for diarrhea or loose stools.   methimazole 5 MG tablet Commonly known as:  TAPAZOLE Take 1 tablet (5 mg total) by mouth daily.    nadolol 40 MG tablet Commonly known as:  CORGARD Take 40 mg by mouth daily.   nitroGLYCERIN 0.4 MG SL tablet Commonly known as:  NITROSTAT Place 0.4 mg under the tongue every 5 (five) minutes as needed for chest pain. For 3 doses as needed for chest pain   ORAL ANALGESIC MAX ST 20 % Gel Generic drug:  benzocaine Use as directed 1 application in the mouth or throat 3 (three) times daily.   oxybutynin 5 MG tablet Commonly known as:  DITROPAN Take 5 mg by mouth daily.   pramipexole 1.5 MG tablet Commonly known as:  MIRAPEX Take 1.5 mg by mouth 2 (two) times daily.   SALONPAS EX Apply 1 patch topically daily as needed (Pain).   sertraline 100 MG tablet Commonly known as:  ZOLOFT Take 200 mg by mouth daily. Take two 100 mg tablets to = 200 mg po QD for depression   shark liver oil-cocoa butter 0.25-3-85.5 % suppository Commonly known as:  PREPARATION H Place 1 suppository rectally 2 (two) times daily as needed for hemorrhoids.   topiramate 50 MG tablet Commonly known as:  TOPAMAX Take 50 mg by mouth 2 (two) times daily.            Discharge Care Instructions        Start     Ordered   08/05/17 0000  HYDROcodone-acetaminophen (NORCO/VICODIN) 5-325  MG tablet  Every 4 hours PRN     08/05/17 1109   08/05/17 0000  Increase activity slowly     08/05/17 1109   08/05/17 0000  Diet - low sodium heart healthy     08/05/17 1109

## 2017-08-05 NOTE — Progress Notes (Signed)
Date: August 05, 2017 Discharge orders review for case management needs.  None found  Per patient or family member no additional needs at home. Marcelle Smilinghonda Davis, BSN, RN3, CCM:  [phone]

## 2017-08-26 DEATH — deceased

## 2017-09-06 ENCOUNTER — Ambulatory Visit: Payer: Medicare Other | Admitting: Endocrinology
